# Patient Record
Sex: Female | Born: 1980 | Race: White | Hispanic: No | Marital: Single | Smoking: Current every day smoker
Health system: Southern US, Community
[De-identification: ages and names within clinical notes are randomized; demographics above are authoritative.]

## PROBLEM LIST (undated history)

## (undated) DIAGNOSIS — F32A Depression, unspecified: Secondary | ICD-10-CM

## (undated) DIAGNOSIS — F329 Major depressive disorder, single episode, unspecified: Secondary | ICD-10-CM

## (undated) DIAGNOSIS — J40 Bronchitis, not specified as acute or chronic: Secondary | ICD-10-CM

## (undated) DIAGNOSIS — N760 Acute vaginitis: Secondary | ICD-10-CM

## (undated) DIAGNOSIS — J45909 Unspecified asthma, uncomplicated: Secondary | ICD-10-CM

## (undated) DIAGNOSIS — N898 Other specified noninflammatory disorders of vagina: Secondary | ICD-10-CM

## (undated) DIAGNOSIS — R87629 Unspecified abnormal cytological findings in specimens from vagina: Secondary | ICD-10-CM

## (undated) DIAGNOSIS — M539 Dorsopathy, unspecified: Secondary | ICD-10-CM

## (undated) DIAGNOSIS — B9689 Other specified bacterial agents as the cause of diseases classified elsewhere: Secondary | ICD-10-CM

## (undated) DIAGNOSIS — Z8619 Personal history of other infectious and parasitic diseases: Secondary | ICD-10-CM

## (undated) DIAGNOSIS — N644 Mastodynia: Secondary | ICD-10-CM

## (undated) DIAGNOSIS — A749 Chlamydial infection, unspecified: Secondary | ICD-10-CM

## (undated) DIAGNOSIS — F419 Anxiety disorder, unspecified: Secondary | ICD-10-CM

## (undated) HISTORY — DX: Other specified bacterial agents as the cause of diseases classified elsewhere: B96.89

## (undated) HISTORY — DX: Unspecified abnormal cytological findings in specimens from vagina: R87.629

## (undated) HISTORY — PX: TOTAL ABDOMINAL HYSTERECTOMY: SHX209

## (undated) HISTORY — PX: ABDOMINAL HYSTERECTOMY: SHX81

## (undated) HISTORY — DX: Acute vaginitis: N76.0

## (undated) HISTORY — DX: Unspecified asthma, uncomplicated: J45.909

## (undated) HISTORY — DX: Mastodynia: N64.4

## (undated) HISTORY — PX: TONSILLECTOMY: SUR1361

## (undated) HISTORY — DX: Major depressive disorder, single episode, unspecified: F32.9

## (undated) HISTORY — DX: Anxiety disorder, unspecified: F41.9

## (undated) HISTORY — DX: Other specified noninflammatory disorders of vagina: N89.8

## (undated) HISTORY — DX: Personal history of other infectious and parasitic diseases: Z86.19

## (undated) HISTORY — DX: Dorsopathy, unspecified: M53.9

## (undated) HISTORY — PX: TUBAL LIGATION: SHX77

## (undated) HISTORY — DX: Chlamydial infection, unspecified: A74.9

## (undated) HISTORY — DX: Depression, unspecified: F32.A

---

## 2003-04-25 ENCOUNTER — Emergency Department (HOSPITAL_COMMUNITY): Admission: EM | Admit: 2003-04-25 | Discharge: 2003-04-25 | Payer: Self-pay

## 2003-04-26 ENCOUNTER — Emergency Department (HOSPITAL_COMMUNITY): Admission: EM | Admit: 2003-04-26 | Discharge: 2003-04-26 | Payer: Self-pay | Admitting: Emergency Medicine

## 2003-07-04 ENCOUNTER — Emergency Department (HOSPITAL_COMMUNITY): Admission: EM | Admit: 2003-07-04 | Discharge: 2003-07-05 | Payer: Self-pay | Admitting: Emergency Medicine

## 2006-05-19 ENCOUNTER — Other Ambulatory Visit: Admission: RE | Admit: 2006-05-19 | Discharge: 2006-05-19 | Payer: Self-pay | Admitting: Family Medicine

## 2006-08-10 ENCOUNTER — Emergency Department (HOSPITAL_COMMUNITY): Admission: EM | Admit: 2006-08-10 | Discharge: 2006-08-10 | Payer: Self-pay | Admitting: Emergency Medicine

## 2007-08-12 ENCOUNTER — Ambulatory Visit: Payer: Self-pay | Admitting: Internal Medicine

## 2007-08-19 ENCOUNTER — Ambulatory Visit (HOSPITAL_COMMUNITY): Admission: RE | Admit: 2007-08-19 | Discharge: 2007-08-19 | Payer: Self-pay | Admitting: Internal Medicine

## 2007-12-09 ENCOUNTER — Ambulatory Visit: Payer: Self-pay | Admitting: Internal Medicine

## 2008-07-26 ENCOUNTER — Ambulatory Visit: Payer: Self-pay | Admitting: Internal Medicine

## 2008-07-26 ENCOUNTER — Encounter: Payer: Self-pay | Admitting: Urgent Care

## 2009-07-27 ENCOUNTER — Other Ambulatory Visit: Admission: RE | Admit: 2009-07-27 | Discharge: 2009-07-27 | Payer: Self-pay | Admitting: Obstetrics & Gynecology

## 2009-08-08 ENCOUNTER — Ambulatory Visit (HOSPITAL_COMMUNITY): Admission: RE | Admit: 2009-08-08 | Discharge: 2009-08-08 | Payer: Self-pay | Admitting: Obstetrics & Gynecology

## 2009-09-12 ENCOUNTER — Encounter: Payer: Self-pay | Admitting: Obstetrics & Gynecology

## 2009-09-12 ENCOUNTER — Inpatient Hospital Stay (HOSPITAL_COMMUNITY): Admission: RE | Admit: 2009-09-12 | Discharge: 2009-09-14 | Payer: Self-pay | Admitting: Obstetrics & Gynecology

## 2009-09-16 ENCOUNTER — Emergency Department (HOSPITAL_COMMUNITY): Admission: EM | Admit: 2009-09-16 | Discharge: 2009-09-16 | Payer: Self-pay | Admitting: Emergency Medicine

## 2010-09-04 ENCOUNTER — Emergency Department (HOSPITAL_COMMUNITY)
Admission: EM | Admit: 2010-09-04 | Discharge: 2010-09-04 | Payer: Self-pay | Source: Home / Self Care | Admitting: Emergency Medicine

## 2010-09-30 ENCOUNTER — Encounter: Payer: Self-pay | Admitting: Obstetrics & Gynecology

## 2010-11-24 LAB — DIFFERENTIAL
Basophils Absolute: 0 10*3/uL (ref 0.0–0.1)
Basophils Relative: 0 % (ref 0–1)
Lymphocytes Relative: 16 % (ref 12–46)
Monocytes Relative: 8 % (ref 3–12)

## 2010-11-24 LAB — URINALYSIS, ROUTINE W REFLEX MICROSCOPIC
Bilirubin Urine: NEGATIVE
Hgb urine dipstick: NEGATIVE
Nitrite: NEGATIVE
Nitrite: NEGATIVE
Protein, ur: NEGATIVE mg/dL
Specific Gravity, Urine: 1.015 (ref 1.005–1.030)
Urobilinogen, UA: 0.2 mg/dL (ref 0.0–1.0)
Urobilinogen, UA: 0.2 mg/dL (ref 0.0–1.0)

## 2010-11-24 LAB — COMPREHENSIVE METABOLIC PANEL
Albumin: 4.2 g/dL (ref 3.5–5.2)
BUN: 7 mg/dL (ref 6–23)
Creatinine, Ser: 0.76 mg/dL (ref 0.4–1.2)
Sodium: 138 mEq/L (ref 135–145)
Total Bilirubin: 0.6 mg/dL (ref 0.3–1.2)
Total Protein: 7.1 g/dL (ref 6.0–8.3)

## 2010-11-24 LAB — CBC
Hemoglobin: 11.2 g/dL — ABNORMAL LOW (ref 12.0–15.0)
Hemoglobin: 11.7 g/dL — ABNORMAL LOW (ref 12.0–15.0)
Hemoglobin: 13.9 g/dL (ref 12.0–15.0)
MCHC: 33.6 g/dL (ref 30.0–36.0)
MCHC: 33.7 g/dL (ref 30.0–36.0)
MCHC: 33.8 g/dL (ref 30.0–36.0)
Platelets: 272 10*3/uL (ref 150–400)
RBC: 3.5 MIL/uL — ABNORMAL LOW (ref 3.87–5.11)
RBC: 3.65 MIL/uL — ABNORMAL LOW (ref 3.87–5.11)
RDW: 13.6 % (ref 11.5–15.5)
RDW: 13.6 % (ref 11.5–15.5)
WBC: 12 10*3/uL — ABNORMAL HIGH (ref 4.0–10.5)

## 2010-11-24 LAB — WOUND CULTURE: Gram Stain: NONE SEEN

## 2010-11-24 LAB — WET PREP, GENITAL: WBC, Wet Prep HPF POC: NONE SEEN

## 2010-11-24 LAB — TYPE AND SCREEN: ABO/RH(D): O POS

## 2010-11-24 LAB — URINE MICROSCOPIC-ADD ON

## 2010-12-11 LAB — COMPREHENSIVE METABOLIC PANEL
Albumin: 4.1 g/dL (ref 3.5–5.2)
Alkaline Phosphatase: 91 U/L (ref 39–117)
BUN: 9 mg/dL (ref 6–23)
Creatinine, Ser: 0.68 mg/dL (ref 0.4–1.2)
GFR calc Af Amer: >60 mL/min (ref >60)
Glucose, Bld: 86 mg/dL (ref 70–99)
Potassium: 3.8 mEq/L (ref 3.5–5.1)
Total Protein: 7.1 g/dL (ref 6.0–8.3)

## 2010-12-11 LAB — URINALYSIS, ROUTINE W REFLEX MICROSCOPIC
Ketones, ur: NEGATIVE mg/dL
Nitrite: NEGATIVE
Protein, ur: NEGATIVE mg/dL
pH: 6 (ref 5.0–8.0)

## 2010-12-11 LAB — HCG, QUANTITATIVE, PREGNANCY: hCG, Beta Chain, Quant, S: <2 m[IU]/mL (ref <5)

## 2010-12-11 LAB — CBC
MCV: 94.5 fL (ref 78.0–100.0)
RBC: 4.39 MIL/uL (ref 3.87–5.11)
WBC: 5.7 10*3/uL (ref 4.0–10.5)

## 2011-01-21 NOTE — Assessment & Plan Note (Signed)
NAMEMarland Kitchen  [place], [place] M                 CHART#:  [id number]   DATE:  12/09/2007                       DOB:  1981-07-03   CHIEF COMPLAINT:  Follow-up abdominal pain.   Alice Santiago is a 30 year old female who complains of mostly right upper  quadrant abdominal pain.  This has been present for about 6 months now.  She also has had intermittent constipation and nausea.  Workup thus far  has included a HIDA scan which revealed a low normal gallbladder  ejection fracture where she developed nausea and abdominal cramps and  CCK but not her typical right upper quadrant.  Symptoms were felt not  typical for biliary disease.  She has constipation alternating with  diarrhea.  She had CBC, met 7 and TSH all of which were normal.  She  also had a full set of stool studies which included culture ova and  parasite and C. diff which were negative.  She had a CT scan of the  abdomen and pelvis with HIV and oral contrast on 08/19/07 which showed a  right ovarian cyst which is 1.7 cm.  Small to moderate amount of free  pelvic fluid.  She then had a pelvic ultrasound on 09/10/07 which showed  a 2.4 cm simple cyst in the left ovary and a 12 mm hyperechoic lesion in  the right ovary possibly a involuting hemorrhagic cyst or follicle.  She  is recommended to have follow-up ultrasound.  She continues to have some  right upper quadrant pain. She had not tried any Bentyl for this which  she was given at last office visit.  She tells me she did not have the  money to go get it.  She complains of intermittent nausea.  She notes  the pain is typically worse with movement or breathing.  She denies any  vomiting.  She rates the pain at 6/10 at worse on pain scale. It usually  lasts a couple of hours.  She has been passing gas more frequently.  She  denies any water brush or anorexia. She does has have loose stools every  time she eats anywhere from 1-4 x per day but does not describe them as  watery.  She denies any  rectal bleeding or melena.  She tells me she is  applying for disability due to her history of manic depression/bipolar  disorder.   ALLERGIES:  Denies any allergies.  No known drug allergies.   OBJECTIVE:  VITALS:  Weight 156.5 pounds, height 63 inches, temp 98.1.  Blood pressure 98/70 and pulse 72.  GENERAL:  Alice Santiago is a 30 year old well-developed, well-nourished  Caucasian female, no acute distress.  HEENT:  Sclera clear, nonicteric.  Conjunctiva pink.  Oropharynx pink  and moist without any lesion.  NECK:  Supple without hepatosplenomegaly.  CHEST/HEART:  Regular rate and rhythm. Normal S1/S2.  ABDOMEN:  Positive bowel sounds x4.  No bruits auscultated. Soft,  nontender, nondistended without palpable mass or hepatosplenomegaly.  No  rebound tenderness or guarding.  EXTREMITIES:  Without clubbing or edema bilaterally.   ASSESSMENT:  Alice Santiago is a 30 year old female with right upper  quadrant pain as well as intermittent nausea more along the lines of  dyspepsia.  I suspect she may have gastroesophageal reflux disease,  gastritis, or nonulcer dyspepsia.  She also has postprandial  loose  stools along the lines of IBS.  Weight is up 4-1/2 pounds since 6 months  ago.  Thus far CT scan, lab work, and stool studies have been benign.  I  suspect she may have an element of IBS as well.   She does need follow-up of her ovarian cyst and free pelvic fluid and  will refer her back to Paulene Floor, her health care Namine Beahm at [facility].   PLAN:  1. Referral back to Ms. Daphine Deutscher at [facility]      for ovarian cyst to follow-up.  Will send a copy of CT scan and      ultrasound report.  2. Begin omeprazole 20 mg daily #30 with 2 refills.  3. We will give her some samples of Prilosec 20 mg today until she can      get the prescription filled.  4. Trial of Bentyl and she has prescription from last office visit.  I      did encourage her to  go ahead and get her prescriptions filled as      she has not yet done due to monetary reasons.  5. Office visit in one month with Dr. Jena Gauss to asses how she is doing      on omeprazole and Bentyl.       Lorenza Burton, N.P.  Electronically Signed     R. Roetta Sessions, M.D.  Electronically Signed    KJ/MEDQ  D:  12/10/2007  T:  12/10/2007  Job:  [id number]   cc:   Paulene Floor

## 2011-01-21 NOTE — Assessment & Plan Note (Signed)
NAMEMarland Kitchen  [place], [place] M                 CHART#:  [id number]   DATE:  12/09/2007                       DOB:  Oct 11, 1980   CHIEF COMPLAINT:  Followup abdominal pain.   SUBJECTIVE:  The patient is a 30 year old female with a history of       Lorenza Burton, N.P.  Electronically Signed     R. Roetta Sessions, M.D.  Electronically Signed    KJ/MEDQ  D:  12/10/2007  T:  12/10/2007  Job:  [id number]

## 2011-01-21 NOTE — Assessment & Plan Note (Signed)
NAMEMarland Kitchen  [place], [place] M                 CHART#:  [id number]   DATE:  07/26/2008                       DOB:  1981/01/30   CHIEF COMPLAINT:  Abdominal pain and weight gain.   PROBLEM LIST:  1. Chronic abdominal pain with loose stools felt to be irritable bowel      syndrome.  2. Anxiety.  3. Depression.  4. Asthma.  5. Bipolar disorder.  6. C-section x2.  7. Left wrist and left foot surgery.  8. Tonsillectomy.  9. Gastroesophageal reflux disease.   SUBJECTIVE:  The patient is a 30 year old Caucasian female.  She  presents for followup of chronic abdominal pain.  She tells me she is  concerned of the fact she is gaining weight.  She has gained 28 pounds  since she was seen 11 months ago.  She occasionally has heartburn and  indigestion.  She is having right upper quadrant pain, which is worse  postprandially.  She had a normal HIDA scan in November of last year.  She has daily loose stools about twice per day.  Her last menstrual  period was 3 months ago.  She has been treated with megestrol for a  complex right ovarian cyst and is being followed and seen by Dr. Despina Hidden  for this.  She has recently had a TSH, which is normal now.  She denies  any nausea or vomiting.  Denies any anorexia.   ALLERGIES:  No known drug allergies.   OBJECTIVE:  VITAL SIGNS:  Weight 108 pounds, height 63 inches,  temperature 98.4, blood pressure 122/80, and pulse 72.  GENERAL:  The patient is an obese Caucasian female who is alert,  oriented, pleasant, and cooperative, in no acute distress.  HEENT:  Sclerae clear and nonicteric.  Conjunctivae pink.  Oropharynx  pink and moist without any lesions.  CHEST:  Heart regular rate and rhythm.  Normal S1 and S2 without any  murmurs, clicks, rubs, or gallops.  ABDOMEN:  Protuberant with positive bowel sounds x4.  No bruits  auscultated.  Soft, mildly distended.  She does have mild tenderness to  right upper quadrant and epigastrium on deep palpation.  There is no  rebound, tenderness, or guarding.  No hepatosplenomegaly or mass.  EXTREMITIES:  Without clubbing or edema.   IMPRESSION:  The patient is a 30 year old Caucasian female with right-  sided abdominal pain and diarrhea, which I suspect is due to an amount  of irritable bowel syndrome.  She also continues to gain weight and is  now significantly overweight.  We did discuss this in detail.  She does  have heartburn and indigestion and her right upper quadrant abdominal  pain may very well be related to gastroesophageal reflux  disease/gastritis and I would like to see if she has any improvement on  proton pump inhibitor.   PLAN:  1. Omeprazole 20 mg daily #31 with 1 refill.  2. Bentyl 10 mg a.c. and nightly, #90 with 0 refills to use p.r.n. for      diarrhea.  3. Urine pregnancy before she begins either medication.  4. We will request recent lab work from Dr. Forestine Chute office in [facility].  5. Office visit in 6 weeks to assess whether she has had improvement      with antispasmodic  and PPI or whether she is going to need further      workup.        Lorenza Burton, N.P.  Electronically Signed     R. Roetta Sessions, M.D.  Electronically Signed    KJ/MEDQ  D:  07/27/2008  T:  07/27/2008  Job:  [id number]   cc:   Ernestina Penna, M.D.

## 2011-01-21 NOTE — Telephone Encounter (Signed)
NAMEMarland Santiago  [place], [place] M                 CHART#:  [id number]   DATE:  08/12/2007                       DOB:  20-Aug-1981   GI CONSULTATION   REASON FOR CONSULTATION:  Abdominal pain, diarrhea.   REFERRING PHYSICIAN:  Ernestina Penna, M.D.   HISTORY OF PRESENT ILLNESS:  The patient is a 30 year old Caucasian  female who presents today for further evaluation of a 39-month history of  intermittent abdominal pain associated with diarrhea.  She has had pain  primarily in the right upper quadrant region, sometimes it is post  prandial, other times it just occurs.  She describes it as a sharp,  shooting type pain, sometimes it is crampy.  She has also noted post  prandial bowel movements.  The stool often is loose but she does pass  hard stools at times.  She has not seen any blood in her stool or  melena.  She has anywhere from 4-5 stools a day.  She has had nausea but  no vomiting.  She denies any heartburn type symptoms.  Over the past  several months she states she has gained about 20 pounds.  She states  she weighs the most she has ever weighed, even during her pregnancies.  She denies any dysphagia, odynophagia, dysuria, hematuria.  She notes  she has irregular menses with prolonged bleeding and spotting and severe  abdominal cramps.  She has had 2 cesarean sections, the last one about 2  years ago.  She states she was supposed to have her tubes tied at that  point but she is really not sure if this occurred and says that she was  told that she still could get pregnant.  She denies any nocturnal  diarrhea.  She says she has a lot of stress.  She is a single mom  raising a 66-year-old.  Her 57-year-old does not live in the home, and  lives with her parents.  She is currently not on any medications.   ALLERGIES:  No known drug allergies.   PAST MEDICAL HISTORY:  1. Anxiety.  2. Depression.  3. Asthma.  4. Bipolar disorder.   PAST SURGICAL HISTORY:  1. She has had 2 cesarean sections.  2. She had surgery on her left wrist twice.  3. Surgery on her left foot.  4. Tonsillectomy.   FAMILY HISTORY:  Mother is 53.  Father succumbed to an MI.  No family  history of colorectal cancer to her knowledge.  She is not aware of any  IBD.   SOCIAL HISTORY:  She is single.  She has 2 children.  Her 84-year-old  lives with her.  Her 82-year-old lives with her Mom and step-father.  She  is unemployed.  She smokes a half pack of cigarettes daily.  She  occasionally consumes alcohol.   REVIEW OF SYSTEMS:  Please see HPI for GI.  Constitutional:  See HPI.  Cardiopulmonary:  No chest pain or shortness of breath.  Genitourinary:  See HPI.   PHYSICAL EXAMINATION:  VITAL SIGNS:  Weight 152, height 5 feet 3 inches,  temp 98.2, blood pressure 110/70, pulse 68.  GENERAL:  A pleasant, well nourished, well developed Caucasian female in  no acute distress.  SKIN:  Warm and dry.  No jaundice.  HEENT:  Sclerae are nonicteric.  Oropharyngeal mucosa moist  and pink.  No lesions, erythema, or exudate.  No lymphadenopathy, thyromegaly.  CHEST:  Lungs are clear to auscultation.  CARDIAC:  Reveals a regular rate and rhythm.  Normal S1, S2.  No  murmurs, rubs, or gallops.  ABDOMEN:  Positive bowel sounds.  Abdomen is soft, nondistended.  She  has mild right upper quadrant tenderness on deep palpation.  No  organomegaly or masses.  No rebound tenderness or guarding.  No  abdominal bruits or hernias.  EXTREMITIES:  No edema.   IMPRESSION:  The patient is a 30 year old lady who complains of a 2-  month history of primarily right upper quadrant abdominal pain and post  prandial loose stools.  She also has intermittent constipation.  She has  had a 20-pound weight gain in the last few months.  Her gallbladder  workup was unremarkable outside of a low normal gallbladder ejection  fraction percent.  The patient states she developed some nausea and  abdominal cramps with CCK, but really not her typical right  upper  quadrant pain.  The symptoms are not classical for biliary disease.  The  patient's report of loose stools may possibly be due to irritable bowel  syndrome, however, other possibilities such as inflammatory bowel  disease, infectious etiology need to be ruled out.   PLAN:  1. CBC, C-MET, serum HCG, TSH.  2. Stool for culture, ova and parasite, C. Diff.  3. Trial of Bentyl 10 mg p.o. q.a.c. p.r.n. #90, one refill.  4. CT of the abdomen and pelvis with IV and oral contrast for further      evaluation of her abdominal pain.   I would like to thank Dr. Vernon Prey for allowing [place] to take part in the  care of this patient.      Tana Coast, P.A.  Electronically Signed     R. Roetta Sessions, M.D.  Electronically Signed    LL/MEDQ  D:  08/12/2007  T:  08/12/2007  Job:  [id number]   cc:   Ernestina Penna, M.D.

## 2013-06-03 ENCOUNTER — Ambulatory Visit: Payer: Self-pay | Admitting: Obstetrics and Gynecology

## 2013-07-25 ENCOUNTER — Other Ambulatory Visit: Payer: Self-pay | Admitting: Obstetrics & Gynecology

## 2013-07-26 ENCOUNTER — Encounter: Payer: Self-pay | Admitting: Obstetrics & Gynecology

## 2013-07-26 ENCOUNTER — Ambulatory Visit (INDEPENDENT_AMBULATORY_CARE_PROVIDER_SITE_OTHER): Payer: Medicaid Other | Admitting: Obstetrics & Gynecology

## 2013-07-26 ENCOUNTER — Encounter (INDEPENDENT_AMBULATORY_CARE_PROVIDER_SITE_OTHER): Payer: Self-pay

## 2013-07-26 ENCOUNTER — Other Ambulatory Visit (HOSPITAL_COMMUNITY)
Admission: RE | Admit: 2013-07-26 | Discharge: 2013-07-26 | Disposition: A | Discharge status: Home / Self Care | Payer: Medicaid Other | Source: Ambulatory Visit | Attending: Obstetrics & Gynecology | Admitting: Obstetrics & Gynecology

## 2013-07-26 VITALS — BP 110/70 | Ht 63.0 in | Wt 196.0 lb

## 2013-07-26 DIAGNOSIS — F39 Unspecified mood [affective] disorder: Secondary | ICD-10-CM | POA: Insufficient documentation

## 2013-07-26 DIAGNOSIS — Z7251 High risk heterosexual behavior: Secondary | ICD-10-CM

## 2013-07-26 DIAGNOSIS — Z01419 Encounter for gynecological examination (general) (routine) without abnormal findings: Secondary | ICD-10-CM

## 2013-07-26 DIAGNOSIS — Z0000 Encounter for general adult medical examination without abnormal findings: Secondary | ICD-10-CM

## 2013-07-26 DIAGNOSIS — Z113 Encounter for screening for infections with a predominantly sexual mode of transmission: Secondary | ICD-10-CM | POA: Insufficient documentation

## 2013-07-26 DIAGNOSIS — Z1151 Encounter for screening for human papillomavirus (HPV): Secondary | ICD-10-CM | POA: Insufficient documentation

## 2013-07-26 LAB — RPR

## 2013-07-26 NOTE — Progress Notes (Signed)
Patient ID: Alice Santiago, female   DOB: [birth year], 32 y.o.   MRN: [id number] Subjective:     Alice Santiago is a 32 y.o. female here for a routine exam.  No LMP recorded. Patient has had a hysterectomy. No obstetric history on file. Current complaints: none, wants STD testing. .   Gynecologic History No LMP recorded. Patient has had a hysterectomy. Contraception: status post hysterectomy Last Pap: 2012. Results were: normal Last mammogram: na. Results were: na  Past Medical History  Diagnosis Date  . Anxiety   . Depression   . Back problem     Past Surgical History  Procedure Laterality Date  . Abdominal hysterectomy    . Tubal ligation      OB History   Grav Para Term Preterm Abortions TAB SAB Ect Mult Living                  History   Social History  . Marital Status: Single    Spouse Name: N/A    Number of Children: N/A  . Years of Education: N/A   Social History Main Topics  . Smoking status: Current Every Day Smoker  . Smokeless tobacco: None  . Alcohol Use: None  . Drug Use: None  . Sexual Activity: None   Other Topics Concern  . None   Social History Narrative  . None    History reviewed. No pertinent family history.   Review of Systems  Review of Systems  Constitutional: Negative for fever, chills, weight loss, malaise/fatigue and diaphoresis.  HENT: Negative for hearing loss, ear pain, nosebleeds, congestion, sore throat, neck pain, tinnitus and ear discharge.   Eyes: Negative for blurred vision, double vision, photophobia, pain, discharge and redness.  Respiratory: Negative for cough, hemoptysis, sputum production, shortness of breath, wheezing and stridor.   Cardiovascular: Negative for chest pain, palpitations, orthopnea, claudication, leg swelling and PND.  Gastrointestinal: negative for abdominal pain. Negative for heartburn, nausea, vomiting, diarrhea, constipation, blood in stool and melena.  Genitourinary: Negative for dysuria,  urgency, frequency, hematuria and flank pain.  Musculoskeletal: Negative for myalgias, back pain, joint pain and falls.  Skin: Negative for itching and rash.  Neurological: Negative for dizziness, tingling, tremors, sensory change, speech change, focal weakness, seizures, loss of consciousness, weakness and headaches.  Endo/Heme/Allergies: Negative for environmental allergies and polydipsia. Does not bruise/bleed easily.  Psychiatric/Behavioral: Negative for depression, suicidal ideas, hallucinations, memory loss and substance abuse. The patient is not nervous/anxious and does not have insomnia.        Objective:    Physical Exam  Vitals reviewed. Constitutional: She is oriented to person, place, and time. She appears well-developed and well-nourished.  HENT:  Head: Normocephalic and atraumatic.        Right Ear: External ear normal.  Left Ear: External ear normal.  Nose: Nose normal.  Mouth/Throat: Oropharynx is clear and moist.  Eyes: Conjunctivae and EOM are normal. Pupils are equal, round, and reactive to light. Right eye exhibits no discharge. Left eye exhibits no discharge. No scleral icterus.  Neck: Normal range of motion. Neck supple. No tracheal deviation present. No thyromegaly present.  Cardiovascular: Normal rate, regular rhythm, normal heart sounds and intact distal pulses.  Exam reveals no gallop and no friction rub.   No murmur heard. Respiratory: Effort normal and breath sounds normal. No respiratory distress. She has no wheezes. She has no rales. She exhibits no tenderness.  GI: Soft. Bowel sounds are normal. She exhibits no distension and  no mass. There is no tenderness. There is no rebound and no guarding.  Genitourinary:  Breasts no masses skin changes or nipple changes bilaterally      Vulva is normal without lesions Vagina is pink moist without discharge Cervix absent Uterus is absent Adnexa is negative with normal sized ovaries   Musculoskeletal: Normal range  of motion. She exhibits no edema and no tenderness.  Neurological: She is alert and oriented to person, place, and time. She has normal reflexes. She displays normal reflexes. No cranial nerve deficit. She exhibits normal muscle tone. Coordination normal.  Skin: Skin is warm and dry. No rash noted. No erythema. No pallor.  Psychiatric: She has a normal mood and affect. Her behavior is normal. Judgment and thought content normal.       Assessment:    Healthy female exam.    Plan:    Follow up in: 1 year.   STD evaluation

## 2013-07-26 NOTE — Addendum Note (Signed)
Addended by: Richardson Chiquito on: 07/26/2013 11:42 AM   Modules accepted: Orders

## 2013-07-27 LAB — HSV 2 ANTIBODY, IGG: HSV 2 Glycoprotein G Ab, IgG: 12.04 IV — ABNORMAL HIGH

## 2013-07-27 LAB — HEPATITIS C ANTIBODY: HCV Ab: NEGATIVE

## 2013-07-27 LAB — HIV ANTIBODY (ROUTINE TESTING W REFLEX): HIV: NONREACTIVE

## 2013-11-22 DIAGNOSIS — M51369 Other intervertebral disc degeneration, lumbar region without mention of lumbar back pain or lower extremity pain: Secondary | ICD-10-CM | POA: Insufficient documentation

## 2014-07-28 ENCOUNTER — Ambulatory Visit (INDEPENDENT_AMBULATORY_CARE_PROVIDER_SITE_OTHER): Payer: Medicaid Other | Admitting: Obstetrics & Gynecology

## 2014-07-28 ENCOUNTER — Encounter: Payer: Self-pay | Admitting: Obstetrics & Gynecology

## 2014-07-28 VITALS — BP 128/80 | Ht 63.0 in | Wt 184.0 lb

## 2014-07-28 DIAGNOSIS — R739 Hyperglycemia, unspecified: Secondary | ICD-10-CM

## 2014-07-28 DIAGNOSIS — Z01419 Encounter for gynecological examination (general) (routine) without abnormal findings: Secondary | ICD-10-CM | POA: Diagnosis not present

## 2014-07-28 DIAGNOSIS — Z7251 High risk heterosexual behavior: Secondary | ICD-10-CM

## 2014-07-28 DIAGNOSIS — Z0000 Encounter for general adult medical examination without abnormal findings: Secondary | ICD-10-CM | POA: Diagnosis not present

## 2014-07-28 DIAGNOSIS — E038 Other specified hypothyroidism: Secondary | ICD-10-CM

## 2014-07-28 NOTE — Progress Notes (Signed)
Patient ID: Alice Santiago, female   DOB: [birth year], 33 y.o.   MRN: [id number] Subjective:     Alice Santiago is a 33 y.o. female here for a routine exam.  No LMP recorded. Patient has had a hysterectomy. No obstetric history on file. Birth Control Method:  hysterectomy Menstrual Calendar(currently): na  Current complaints: wants to be checked for STDs.   Current acute medical issues:  none   Recent Gynecologic History No LMP recorded. Patient has had a hysterectomy. Last Pap: 2013,  normal Last mammogram: ,    Past Medical History  Diagnosis Date  . Anxiety   . Depression   . Back problem     Past Surgical History  Procedure Laterality Date  . Abdominal hysterectomy    . Tubal ligation      OB History    No data available      History   Social History  . Marital Status: Single    Spouse Name: N/A    Number of Children: N/A  . Years of Education: N/A   Social History Main Topics  . Smoking status: Current Every Day Smoker  . Smokeless tobacco: None  . Alcohol Use: None  . Drug Use: None  . Sexual Activity: None   Other Topics Concern  . None   Social History Narrative    History reviewed. No pertinent family history.  Current outpatient prescriptions: clonazePAM (KLONOPIN) 0.5 MG tablet, Take 0.5 mg by mouth 2 (two) times daily as needed for anxiety., Disp: , Rfl: ;  oxyCODONE-acetaminophen (PERCOCET) 10-325 MG per tablet, Take 1 tablet by mouth every 4 (four) hours as needed for pain., Disp: , Rfl: ;  sertraline (ZOLOFT) 25 MG tablet, Take 25 mg by mouth daily., Disp: , Rfl:  venlafaxine XR (EFFEXOR-XR) 37.5 MG 24 hr capsule, 150 mg daily with breakfast. , Disp: , Rfl: ;  ALPRAZolam (XANAX) 1 MG tablet, Take 1 mg by mouth at bedtime as needed for anxiety., Disp: , Rfl: ;  ARIPiprazole (ABILIFY) 10 MG tablet, Take 10 mg by mouth daily., Disp: , Rfl: ;  gabapentin (NEURONTIN) 300 MG capsule, Take 300 mg by mouth 3 (three) times daily., Disp: , Rfl:   Review of  Systems  Review of Systems  Constitutional: Negative for fever, chills, weight loss, malaise/fatigue and diaphoresis.  HENT: Negative for hearing loss, ear pain, nosebleeds, congestion, sore throat, neck pain, tinnitus and ear discharge.   Eyes: Negative for blurred vision, double vision, photophobia, pain, discharge and redness.  Respiratory: Negative for cough, hemoptysis, sputum production, shortness of breath, wheezing and stridor.   Cardiovascular: Negative for chest pain, palpitations, orthopnea, claudication, leg swelling and PND.  Gastrointestinal: negative for abdominal pain. Negative for heartburn, nausea, vomiting, diarrhea, constipation, blood in stool and melena.  Genitourinary: Negative for dysuria, urgency, frequency, hematuria and flank pain.  Musculoskeletal: Negative for myalgias, back pain, joint pain and falls.  Skin: Negative for itching and rash.  Neurological: Negative for dizziness, tingling, tremors, sensory change, speech change, focal weakness, seizures, loss of consciousness, weakness and headaches.  Endo/Heme/Allergies: Negative for environmental allergies and polydipsia. Does not bruise/bleed easily.  Psychiatric/Behavioral: Negative for depression, suicidal ideas, hallucinations, memory loss and substance abuse. The patient is not nervous/anxious and does not have insomnia.        Objective:  Blood pressure 128/80, height 5\' 3"  (1.6 m), weight 184 lb (83.462 kg).   Physical Exam  Vitals reviewed. Constitutional: She is oriented to person, place, and time. She  appears well-developed and well-nourished.  HENT:  Head: Normocephalic and atraumatic.        Right Ear: External ear normal.  Left Ear: External ear normal.  Nose: Nose normal.  Mouth/Throat: Oropharynx is clear and moist.  Eyes: Conjunctivae and EOM are normal. Pupils are equal, round, and reactive to light. Right eye exhibits no discharge. Left eye exhibits no discharge. No scleral icterus.  Neck:  Normal range of motion. Neck supple. No tracheal deviation present. No thyromegaly present.  Cardiovascular: Normal rate, regular rhythm, normal heart sounds and intact distal pulses.  Exam reveals no gallop and no friction rub.   No murmur heard. Respiratory: Effort normal and breath sounds normal. No respiratory distress. She has no wheezes. She has no rales. She exhibits no tenderness.  GI: Soft. Bowel sounds are normal. She exhibits no distension and no mass. There is no tenderness. There is no rebound and no guarding.  Genitourinary:  Breasts no masses skin changes or nipple changes bilaterally      Vulva is normal without lesions Vagina is pink moist without discharge Cervix absent Uterus is absent Adnexa is negative with normal sized ovaries    Musculoskeletal: Normal range of motion. She exhibits no edema and no tenderness.  Neurological: She is alert and oriented to person, place, and time. She has normal reflexes. She displays normal reflexes. No cranial nerve deficit. She exhibits normal muscle tone. Coordination normal.  Skin: Skin is warm and dry. No rash noted. No erythema. No pallor.  Psychiatric: She has a normal mood and affect. Her behavior is normal. Judgment and thought content normal.       Assessment:    Healthy female exam.    Plan:    Follow up in: 1 year.   STD screen

## 2014-07-29 LAB — HIV ANTIBODY (ROUTINE TESTING W REFLEX): HIV: NONREACTIVE

## 2014-07-29 LAB — RPR

## 2014-07-29 LAB — HEPATITIS C ANTIBODY: HCV AB: NEGATIVE

## 2014-07-29 LAB — HEMOGLOBIN A1C
HEMOGLOBIN A1C: 5.9 % — AB (ref <5.7)
Mean Plasma Glucose: 123 mg/dL — ABNORMAL HIGH (ref <117)

## 2014-07-29 LAB — TSH: TSH: 1.048 u[IU]/mL (ref 0.350–4.500)

## 2014-07-31 ENCOUNTER — Telehealth: Payer: Self-pay | Admitting: Obstetrics & Gynecology

## 2014-07-31 NOTE — Telephone Encounter (Signed)
Pt informed of A1C 5.9, TSH 1.048, and negative results of HIV, RPR, Hep C from 07/28/2014.

## 2014-11-03 ENCOUNTER — Other Ambulatory Visit: Payer: Self-pay | Admitting: Neurological Surgery

## 2014-11-08 ENCOUNTER — Encounter (HOSPITAL_COMMUNITY): Payer: Self-pay

## 2014-11-08 ENCOUNTER — Encounter (HOSPITAL_COMMUNITY)
Admission: RE | Admit: 2014-11-08 | Discharge: 2014-11-08 | Disposition: A | Discharge status: Home / Self Care | Payer: Medicaid Other | Source: Ambulatory Visit | Attending: Neurological Surgery | Admitting: Neurological Surgery

## 2014-11-08 DIAGNOSIS — F329 Major depressive disorder, single episode, unspecified: Secondary | ICD-10-CM | POA: Diagnosis not present

## 2014-11-08 DIAGNOSIS — M5117 Intervertebral disc disorders with radiculopathy, lumbosacral region: Secondary | ICD-10-CM | POA: Diagnosis present

## 2014-11-08 DIAGNOSIS — IMO0002 Reserved for concepts with insufficient information to code with codable children: Secondary | ICD-10-CM

## 2014-11-08 DIAGNOSIS — Z79899 Other long term (current) drug therapy: Secondary | ICD-10-CM | POA: Diagnosis not present

## 2014-11-08 DIAGNOSIS — F17210 Nicotine dependence, cigarettes, uncomplicated: Secondary | ICD-10-CM | POA: Diagnosis not present

## 2014-11-08 DIAGNOSIS — F419 Anxiety disorder, unspecified: Secondary | ICD-10-CM | POA: Diagnosis not present

## 2014-11-08 HISTORY — DX: Bronchitis, not specified as acute or chronic: J40

## 2014-11-08 LAB — CBC WITH DIFFERENTIAL/PLATELET
Basophils Absolute: 0 10*3/uL (ref 0.0–0.1)
Basophils Relative: 0 % (ref 0–1)
EOS ABS: 0 10*3/uL (ref 0.0–0.7)
Eosinophils Relative: 0 % (ref 0–5)
HCT: 38.4 % (ref 36.0–46.0)
Hemoglobin: 12.6 g/dL (ref 12.0–15.0)
Lymphocytes Relative: 16 % (ref 12–46)
Lymphs Abs: 1.4 10*3/uL (ref 0.7–4.0)
MCH: 31.7 pg (ref 26.0–34.0)
MCHC: 32.8 g/dL (ref 30.0–36.0)
MCV: 96.7 fL (ref 78.0–100.0)
MONO ABS: 0.5 10*3/uL (ref 0.1–1.0)
MONOS PCT: 5 % (ref 3–12)
Neutro Abs: 6.8 10*3/uL (ref 1.7–7.7)
Neutrophils Relative %: 79 % — ABNORMAL HIGH (ref 43–77)
Platelets: 293 10*3/uL (ref 150–400)
RBC: 3.97 MIL/uL (ref 3.87–5.11)
RDW: 14 % (ref 11.5–15.5)
WBC: 8.7 10*3/uL (ref 4.0–10.5)

## 2014-11-08 LAB — SURGICAL PCR SCREEN
MRSA, PCR: NEGATIVE
Staphylococcus aureus: NEGATIVE

## 2014-11-08 LAB — PROTIME-INR
INR: 0.99 (ref 0.00–1.49)
PROTHROMBIN TIME: 13.2 s (ref 11.6–15.2)

## 2014-11-08 LAB — BASIC METABOLIC PANEL
Anion gap: 4 — ABNORMAL LOW (ref 5–15)
BUN: 8 mg/dL (ref 6–23)
CALCIUM: 8.8 mg/dL (ref 8.4–10.5)
CHLORIDE: 110 mmol/L (ref 96–112)
CO2: 24 mmol/L (ref 19–32)
Creatinine, Ser: 0.65 mg/dL (ref 0.50–1.10)
GFR calc Af Amer: >90 mL/min (ref >90)
Glucose, Bld: 113 mg/dL — ABNORMAL HIGH (ref 70–99)
Potassium: 3.6 mmol/L (ref 3.5–5.1)
Sodium: 138 mmol/L (ref 135–145)

## 2014-11-08 NOTE — Pre-Procedure Instructions (Signed)
[place] Levada SchillingM [place]  11/08/2014   Your procedure is scheduled on: 11-10-2014   Friday   [facility] 8:30  AM.   Call this number if you have problems the morning of surgery: [phone]   Remember:   Do not eat food or drink liquids after midnight.    Take these medicines the morning of surgery with A SIP OF WATER: Inhaler as needed,oxycodone,sertraline(Zoloft)topamax,   Do not wear jewelry, make-up or nail polish.  Do not wear lotions, powders, or perfumes. You may not wear deodorant.  Do not shave 48 hours prior to surgery.    Do not bring valuables to the hospital.  [facility] is not responsible  for any belongings or valuables.               Contacts, dentures or bridgework may not be worn into surgery.   Leave suitcase in the car. After surgery it may be brought to your room.  For patients admitted to the hospital, discharge time is determined by your treatment team.               Patients discharged the day of surgery will not be allowed to drive home.     Special Instructions: See attached sheet for instructions on CHG  Shower/bath   Please read over the following fact sheets that you were given: Pain Booklet and Surgical Site Infection Prevention

## 2014-11-09 MED ORDER — DEXAMETHASONE SODIUM PHOSPHATE 10 MG/ML IJ SOLN
10.0000 mg | INTRAMUSCULAR | Status: AC
  Administered 2014-11-10: 10 mg via INTRAVENOUS
  Filled 2014-11-09: qty 1

## 2014-11-09 MED ORDER — CEFAZOLIN SODIUM-DEXTROSE 2-3 GM-% IV SOLR
2.0000 g | INTRAVENOUS | Status: AC
  Administered 2014-11-10: 2 g via INTRAVENOUS
  Filled 2014-11-09: qty 50

## 2014-11-10 ENCOUNTER — Encounter (HOSPITAL_COMMUNITY): Payer: Self-pay | Admitting: *Deleted

## 2014-11-10 ENCOUNTER — Ambulatory Visit (HOSPITAL_COMMUNITY): Payer: Medicaid Other

## 2014-11-10 ENCOUNTER — Encounter (HOSPITAL_COMMUNITY)
Admission: RE | Disposition: A | Discharge status: Home / Self Care | Payer: Self-pay | Source: Ambulatory Visit | Attending: Neurological Surgery

## 2014-11-10 ENCOUNTER — Ambulatory Visit (HOSPITAL_COMMUNITY): Payer: Medicaid Other | Admitting: Anesthesiology

## 2014-11-10 ENCOUNTER — Observation Stay (HOSPITAL_COMMUNITY)
Admission: RE | Admit: 2014-11-10 | Discharge: 2014-11-10 | Disposition: A | Discharge status: Home / Self Care | Payer: Medicaid Other | Source: Ambulatory Visit | Attending: Neurological Surgery | Admitting: Neurological Surgery

## 2014-11-10 DIAGNOSIS — F419 Anxiety disorder, unspecified: Secondary | ICD-10-CM | POA: Diagnosis not present

## 2014-11-10 DIAGNOSIS — M5117 Intervertebral disc disorders with radiculopathy, lumbosacral region: Principal | ICD-10-CM | POA: Insufficient documentation

## 2014-11-10 DIAGNOSIS — Z79899 Other long term (current) drug therapy: Secondary | ICD-10-CM | POA: Insufficient documentation

## 2014-11-10 DIAGNOSIS — F17210 Nicotine dependence, cigarettes, uncomplicated: Secondary | ICD-10-CM | POA: Insufficient documentation

## 2014-11-10 DIAGNOSIS — F329 Major depressive disorder, single episode, unspecified: Secondary | ICD-10-CM | POA: Insufficient documentation

## 2014-11-10 DIAGNOSIS — Z9889 Other specified postprocedural states: Secondary | ICD-10-CM

## 2014-11-10 DIAGNOSIS — Z419 Encounter for procedure for purposes other than remedying health state, unspecified: Secondary | ICD-10-CM

## 2014-11-10 HISTORY — PX: LUMBAR LAMINECTOMY/DECOMPRESSION MICRODISCECTOMY: SHX5026

## 2014-11-10 SURGERY — LUMBAR LAMINECTOMY/DECOMPRESSION MICRODISCECTOMY 1 LEVEL
Anesthesia: General | Site: Spine Lumbar | Laterality: Left

## 2014-11-10 MED ORDER — OXYCODONE HCL 5 MG PO TABS
5.0000 mg | ORAL_TABLET | Freq: Once | ORAL | Status: AC | PRN
  Administered 2014-11-10: 5 mg via ORAL

## 2014-11-10 MED ORDER — METHOCARBAMOL 500 MG PO TABS: 500.0000 mg | ORAL_TABLET | Freq: Four times a day (QID) | ORAL | Status: DC | PRN

## 2014-11-10 MED ORDER — THROMBIN 5000 UNITS EX SOLR
CUTANEOUS | Status: DC | PRN
  Administered 2014-11-10 (×2): 5000 [IU] via TOPICAL

## 2014-11-10 MED ORDER — SERTRALINE HCL 50 MG PO TABS
50.0000 mg | ORAL_TABLET | Freq: Every day | ORAL | Status: DC
  Filled 2014-11-10: qty 1

## 2014-11-10 MED ORDER — LACTATED RINGERS IV SOLN
INTRAVENOUS | Status: DC
  Administered 2014-11-10: 10:00:00 via INTRAVENOUS

## 2014-11-10 MED ORDER — POTASSIUM CHLORIDE IN NACL 20-0.9 MEQ/L-% IV SOLN
INTRAVENOUS | Status: DC
  Filled 2014-11-10 (×2): qty 1000

## 2014-11-10 MED ORDER — OXYCODONE HCL 5 MG PO TABS
5.0000 mg | ORAL_TABLET | Freq: Four times a day (QID) | ORAL | Status: DC | PRN
  Administered 2014-11-10: 5 mg via ORAL
  Filled 2014-11-10: qty 1

## 2014-11-10 MED ORDER — GLYCOPYRROLATE 0.2 MG/ML IJ SOLN
INTRAMUSCULAR | Status: DC | PRN
  Administered 2014-11-10: 0.6 mg via INTRAVENOUS

## 2014-11-10 MED ORDER — PROPOFOL 10 MG/ML IV BOLUS
INTRAVENOUS | Status: DC | PRN
  Administered 2014-11-10: 200 mg via INTRAVENOUS

## 2014-11-10 MED ORDER — ALBUTEROL SULFATE HFA 108 (90 BASE) MCG/ACT IN AERS
INHALATION_SPRAY | RESPIRATORY_TRACT | Status: DC | PRN
  Administered 2014-11-10: 4 via RESPIRATORY_TRACT
  Administered 2014-11-10: 2 via RESPIRATORY_TRACT

## 2014-11-10 MED ORDER — OXYCODONE HCL 5 MG PO TABS
ORAL_TABLET | ORAL | Status: AC
  Filled 2014-11-10: qty 1

## 2014-11-10 MED ORDER — DEXAMETHASONE SODIUM PHOSPHATE 4 MG/ML IJ SOLN: 4.0000 mg | Freq: Four times a day (QID) | INTRAMUSCULAR | Status: DC

## 2014-11-10 MED ORDER — ACETAMINOPHEN 325 MG PO TABS: 650.0000 mg | ORAL_TABLET | ORAL | Status: DC | PRN

## 2014-11-10 MED ORDER — SODIUM CHLORIDE 0.9 % IR SOLN
Status: DC | PRN
  Administered 2014-11-10: 500 mL

## 2014-11-10 MED ORDER — ONDANSETRON HCL 4 MG/2ML IJ SOLN
INTRAMUSCULAR | Status: DC | PRN
  Administered 2014-11-10 (×2): 4 mg via INTRAVENOUS

## 2014-11-10 MED ORDER — HEMOSTATIC AGENTS (NO CHARGE) OPTIME
TOPICAL | Status: DC | PRN
  Administered 2014-11-10: 1 via TOPICAL

## 2014-11-10 MED ORDER — PHENYLEPHRINE 40 MCG/ML (10ML) SYRINGE FOR IV PUSH (FOR BLOOD PRESSURE SUPPORT)
PREFILLED_SYRINGE | INTRAVENOUS | Status: AC
  Filled 2014-11-10: qty 10

## 2014-11-10 MED ORDER — DEXAMETHASONE 4 MG PO TABS: 4.0000 mg | ORAL_TABLET | Freq: Four times a day (QID) | ORAL | Status: DC

## 2014-11-10 MED ORDER — LIDOCAINE HCL (CARDIAC) 20 MG/ML IV SOLN
INTRAVENOUS | Status: DC | PRN
  Administered 2014-11-10: 100 mg via INTRAVENOUS

## 2014-11-10 MED ORDER — OXYCODONE-ACETAMINOPHEN 5-325 MG PO TABS
1.0000 | ORAL_TABLET | Freq: Four times a day (QID) | ORAL | Status: DC | PRN
  Administered 2014-11-10: 1 via ORAL
  Filled 2014-11-10: qty 1

## 2014-11-10 MED ORDER — 0.9 % SODIUM CHLORIDE (POUR BTL) OPTIME
TOPICAL | Status: DC | PRN
  Administered 2014-11-10: 1000 mL

## 2014-11-10 MED ORDER — MIDAZOLAM HCL 5 MG/5ML IJ SOLN
INTRAMUSCULAR | Status: DC | PRN
  Administered 2014-11-10: 2 mg via INTRAVENOUS

## 2014-11-10 MED ORDER — FENTANYL CITRATE 0.05 MG/ML IJ SOLN
INTRAMUSCULAR | Status: AC
  Filled 2014-11-10: qty 5

## 2014-11-10 MED ORDER — HYDROMORPHONE HCL 1 MG/ML IJ SOLN
INTRAMUSCULAR | Status: DC
Start: 2014-11-10 — End: 2014-11-10
  Filled 2014-11-10: qty 1

## 2014-11-10 MED ORDER — ACETAMINOPHEN 650 MG RE SUPP: 650.0000 mg | RECTAL | Status: DC | PRN

## 2014-11-10 MED ORDER — NEOSTIGMINE METHYLSULFATE 10 MG/10ML IV SOLN
INTRAVENOUS | Status: AC
  Filled 2014-11-10: qty 1

## 2014-11-10 MED ORDER — THROMBIN 5000 UNITS EX SOLR
CUTANEOUS | Status: DC | PRN
  Administered 2014-11-10: 5 mL via TOPICAL

## 2014-11-10 MED ORDER — PHENOL 1.4 % MT LIQD: 1.0000 | OROMUCOSAL | Status: DC | PRN

## 2014-11-10 MED ORDER — HYDROMORPHONE HCL 1 MG/ML IJ SOLN
INTRAMUSCULAR | Status: AC
  Filled 2014-11-10: qty 1

## 2014-11-10 MED ORDER — ALBUTEROL SULFATE (2.5 MG/3ML) 0.083% IN NEBU: 3.0000 mL | INHALATION_SOLUTION | Freq: Four times a day (QID) | RESPIRATORY_TRACT | Status: DC | PRN

## 2014-11-10 MED ORDER — TOPIRAMATE 25 MG PO TABS
50.0000 mg | ORAL_TABLET | Freq: Every day | ORAL | Status: DC
  Filled 2014-11-10: qty 2

## 2014-11-10 MED ORDER — ARTIFICIAL TEARS OP OINT
TOPICAL_OINTMENT | OPHTHALMIC | Status: DC | PRN
  Administered 2014-11-10: 1 via OPHTHALMIC

## 2014-11-10 MED ORDER — DEXTROSE 5 % IV SOLN
500.0000 mg | Freq: Four times a day (QID) | INTRAVENOUS | Status: DC | PRN
  Filled 2014-11-10: qty 5

## 2014-11-10 MED ORDER — LIDOCAINE HCL (CARDIAC) 20 MG/ML IV SOLN
INTRAVENOUS | Status: AC
  Filled 2014-11-10: qty 10

## 2014-11-10 MED ORDER — ROCURONIUM BROMIDE 50 MG/5ML IV SOLN
INTRAVENOUS | Status: AC
  Filled 2014-11-10: qty 1

## 2014-11-10 MED ORDER — ONDANSETRON HCL 4 MG/2ML IJ SOLN
INTRAMUSCULAR | Status: AC
  Filled 2014-11-10: qty 4

## 2014-11-10 MED ORDER — EPHEDRINE SULFATE 50 MG/ML IJ SOLN
INTRAMUSCULAR | Status: AC
  Filled 2014-11-10: qty 1

## 2014-11-10 MED ORDER — MENTHOL 3 MG MT LOZG: 1.0000 | LOZENGE | OROMUCOSAL | Status: DC | PRN

## 2014-11-10 MED ORDER — ROCURONIUM BROMIDE 100 MG/10ML IV SOLN
INTRAVENOUS | Status: DC | PRN
  Administered 2014-11-10: 40 mg via INTRAVENOUS
  Administered 2014-11-10: 10 mg via INTRAVENOUS

## 2014-11-10 MED ORDER — NEOSTIGMINE METHYLSULFATE 10 MG/10ML IV SOLN
INTRAVENOUS | Status: DC | PRN
  Administered 2014-11-10: 5 mg via INTRAVENOUS

## 2014-11-10 MED ORDER — AZITHROMYCIN 250 MG PO TABS: 250.0000 mg | ORAL_TABLET | Freq: Every day | ORAL | Status: DC

## 2014-11-10 MED ORDER — PROPOFOL 10 MG/ML IV BOLUS
INTRAVENOUS | Status: AC
  Filled 2014-11-10: qty 20

## 2014-11-10 MED ORDER — PREDNISONE 10 MG PO TABS: 10.0000 mg | ORAL_TABLET | Freq: Every day | ORAL | Status: DC

## 2014-11-10 MED ORDER — OXYCODONE HCL 5 MG/5ML PO SOLN: 5.0000 mg | Freq: Once | ORAL | Status: AC | PRN

## 2014-11-10 MED ORDER — SODIUM CHLORIDE 0.9 % IJ SOLN: 3.0000 mL | Freq: Two times a day (BID) | INTRAMUSCULAR | Status: DC

## 2014-11-10 MED ORDER — SODIUM CHLORIDE 0.9 % IJ SOLN
INTRAMUSCULAR | Status: AC
  Filled 2014-11-10: qty 20

## 2014-11-10 MED ORDER — SODIUM CHLORIDE 0.9 % IV SOLN: 250.0000 mL | INTRAVENOUS | Status: DC

## 2014-11-10 MED ORDER — BUPIVACAINE HCL (PF) 0.25 % IJ SOLN
INTRAMUSCULAR | Status: DC | PRN
  Administered 2014-11-10: 1 mL

## 2014-11-10 MED ORDER — IPRATROPIUM-ALBUTEROL 0.5-2.5 (3) MG/3ML IN SOLN: 3.0000 mL | Freq: Four times a day (QID) | RESPIRATORY_TRACT | Status: DC | PRN

## 2014-11-10 MED ORDER — OXYCODONE-ACETAMINOPHEN 10-325 MG PO TABS: 1.0000 | ORAL_TABLET | Freq: Four times a day (QID) | ORAL | Status: DC

## 2014-11-10 MED ORDER — ALBUTEROL SULFATE HFA 108 (90 BASE) MCG/ACT IN AERS
INHALATION_SPRAY | RESPIRATORY_TRACT | Status: AC
  Filled 2014-11-10: qty 6.7

## 2014-11-10 MED ORDER — CEFAZOLIN SODIUM 1-5 GM-% IV SOLN
1.0000 g | Freq: Three times a day (TID) | INTRAVENOUS | Status: DC
  Filled 2014-11-10 (×2): qty 50

## 2014-11-10 MED ORDER — PROMETHAZINE HCL 25 MG/ML IJ SOLN: 6.2500 mg | INTRAMUSCULAR | Status: DC | PRN

## 2014-11-10 MED ORDER — MIDAZOLAM HCL 2 MG/2ML IJ SOLN
INTRAMUSCULAR | Status: AC
  Filled 2014-11-10: qty 2

## 2014-11-10 MED ORDER — LACTATED RINGERS IV SOLN
INTRAVENOUS | Status: DC | PRN
  Administered 2014-11-10 (×2): via INTRAVENOUS

## 2014-11-10 MED ORDER — CLONAZEPAM 0.5 MG PO TABS: 1.0000 mg | ORAL_TABLET | Freq: Every day | ORAL | Status: DC

## 2014-11-10 MED ORDER — ONDANSETRON HCL 4 MG/2ML IJ SOLN: 4.0000 mg | INTRAMUSCULAR | Status: DC | PRN

## 2014-11-10 MED ORDER — ARTIFICIAL TEARS OP OINT
TOPICAL_OINTMENT | OPHTHALMIC | Status: AC
  Filled 2014-11-10: qty 3.5

## 2014-11-10 MED ORDER — FENTANYL CITRATE 0.05 MG/ML IJ SOLN
INTRAMUSCULAR | Status: DC | PRN
  Administered 2014-11-10 (×4): 100 ug via INTRAVENOUS
  Administered 2014-11-10 (×2): 50 ug via INTRAVENOUS

## 2014-11-10 MED ORDER — SODIUM CHLORIDE 0.9 % IJ SOLN: 3.0000 mL | INTRAMUSCULAR | Status: DC | PRN

## 2014-11-10 MED ORDER — MORPHINE SULFATE 2 MG/ML IJ SOLN: 1.0000 mg | INTRAMUSCULAR | Status: DC | PRN

## 2014-11-10 MED ORDER — VENLAFAXINE HCL ER 150 MG PO CP24
150.0000 mg | ORAL_CAPSULE | Freq: Every day | ORAL | Status: DC
  Filled 2014-11-10: qty 1

## 2014-11-10 MED ORDER — HYDROMORPHONE HCL 1 MG/ML IJ SOLN
0.2500 mg | INTRAMUSCULAR | Status: DC | PRN
  Administered 2014-11-10 (×4): 0.5 mg via INTRAVENOUS

## 2014-11-10 MED ORDER — GLYCOPYRROLATE 0.2 MG/ML IJ SOLN
INTRAMUSCULAR | Status: AC
  Filled 2014-11-10: qty 3

## 2014-11-10 SURGICAL SUPPLY — 52 items
BAG DECANTER FOR FLEXI CONT (MISCELLANEOUS) ×3 IMPLANT
BENZOIN TINCTURE PRP APPL 2/3 (GAUZE/BANDAGES/DRESSINGS) ×3 IMPLANT
BUR MATCHSTICK NEURO 3.0 LAGG (BURR) ×3 IMPLANT
CANISTER SUCT 3000ML PPV (MISCELLANEOUS) ×3 IMPLANT
CLOSURE WOUND 1/2 X4 (GAUZE/BANDAGES/DRESSINGS) ×1
CONT SPEC 4OZ CLIKSEAL STRL BL (MISCELLANEOUS) ×3 IMPLANT
DRAPE LAPAROTOMY 100X72X124 (DRAPES) ×3 IMPLANT
DRAPE MICROSCOPE LEICA (MISCELLANEOUS) ×3 IMPLANT
DRAPE POUCH INSTRU U-SHP 10X18 (DRAPES) ×3 IMPLANT
DRAPE SURG 17X23 STRL (DRAPES) ×3 IMPLANT
DRSG OPSITE 4X5.5 SM (GAUZE/BANDAGES/DRESSINGS) IMPLANT
DRSG OPSITE POSTOP 3X4 (GAUZE/BANDAGES/DRESSINGS) ×3 IMPLANT
DRSG TELFA 3X8 NADH (GAUZE/BANDAGES/DRESSINGS) IMPLANT
DURAPREP 26ML APPLICATOR (WOUND CARE) ×3 IMPLANT
ELECT REM PT RETURN 9FT ADLT (ELECTROSURGICAL) ×3
ELECTRODE REM PT RTRN 9FT ADLT (ELECTROSURGICAL) ×1 IMPLANT
GAUZE SPONGE 4X4 16PLY XRAY LF (GAUZE/BANDAGES/DRESSINGS) IMPLANT
GLOVE BIO SURGEON STRL SZ 6.5 (GLOVE) ×2 IMPLANT
GLOVE BIO SURGEON STRL SZ7 (GLOVE) ×3 IMPLANT
GLOVE BIO SURGEON STRL SZ8 (GLOVE) ×3 IMPLANT
GLOVE BIO SURGEONS STRL SZ 6.5 (GLOVE) ×1
GLOVE BIOGEL PI IND STRL 6.5 (GLOVE) ×1 IMPLANT
GLOVE BIOGEL PI IND STRL 7.5 (GLOVE) ×1 IMPLANT
GLOVE BIOGEL PI IND STRL 8 (GLOVE) ×1 IMPLANT
GLOVE BIOGEL PI INDICATOR 6.5 (GLOVE) ×2
GLOVE BIOGEL PI INDICATOR 7.5 (GLOVE) ×2
GLOVE BIOGEL PI INDICATOR 8 (GLOVE) ×2
GLOVE ECLIPSE 7.5 STRL STRAW (GLOVE) ×6 IMPLANT
GOWN STRL REUS W/ TWL LRG LVL3 (GOWN DISPOSABLE) ×2 IMPLANT
GOWN STRL REUS W/ TWL XL LVL3 (GOWN DISPOSABLE) ×3 IMPLANT
GOWN STRL REUS W/TWL 2XL LVL3 (GOWN DISPOSABLE) IMPLANT
GOWN STRL REUS W/TWL LRG LVL3 (GOWN DISPOSABLE) ×4
GOWN STRL REUS W/TWL XL LVL3 (GOWN DISPOSABLE) ×6
HEMOSTAT POWDER KIT SURGIFOAM (HEMOSTASIS) ×3 IMPLANT
KIT BASIN OR (CUSTOM PROCEDURE TRAY) ×3 IMPLANT
KIT ROOM TURNOVER OR (KITS) ×3 IMPLANT
NEEDLE HYPO 25X1 1.5 SAFETY (NEEDLE) ×3 IMPLANT
NEEDLE SPNL 20GX3.5 QUINCKE YW (NEEDLE) ×3 IMPLANT
NS IRRIG 1000ML POUR BTL (IV SOLUTION) ×3 IMPLANT
PACK LAMINECTOMY NEURO (CUSTOM PROCEDURE TRAY) ×3 IMPLANT
PAD ARMBOARD 7.5X6 YLW CONV (MISCELLANEOUS) ×15 IMPLANT
RUBBERBAND STERILE (MISCELLANEOUS) ×6 IMPLANT
SPONGE SURGIFOAM ABS GEL SZ50 (HEMOSTASIS) ×3 IMPLANT
STRIP CLOSURE SKIN 1/2X4 (GAUZE/BANDAGES/DRESSINGS) ×2 IMPLANT
SUT VIC AB 0 CT1 18XCR BRD8 (SUTURE) ×1 IMPLANT
SUT VIC AB 0 CT1 8-18 (SUTURE) ×2
SUT VIC AB 2-0 CP2 18 (SUTURE) ×3 IMPLANT
SUT VIC AB 3-0 SH 8-18 (SUTURE) ×3 IMPLANT
SYR 20ML ECCENTRIC (SYRINGE) ×3 IMPLANT
TOWEL OR 17X24 6PK STRL BLUE (TOWEL DISPOSABLE) ×3 IMPLANT
TOWEL OR 17X26 10 PK STRL BLUE (TOWEL DISPOSABLE) ×3 IMPLANT
WATER STERILE IRR 1000ML POUR (IV SOLUTION) ×3 IMPLANT

## 2014-11-10 NOTE — Transfer of Care (Signed)
Immediate Anesthesia Transfer of Care Note  Patient: Alice Santiago  Procedure(s) Performed: Procedure(s) with comments: LUMBAR LAMINECTOMY/DECOMPRESSION MICRODISCECTOMY LEFT LUMBAR FIVE-SACRAL ONE (Left) - left  Patient Location: PACU  Anesthesia Type:General  Level of Consciousness: awake, oriented, sedated, patient cooperative and responds to stimulation  Airway & Oxygen Therapy: Patient Spontanous Breathing and Patient connected to nasal cannula oxygen  Post-op Assessment: Report given to RN, Post -op Vital signs reviewed and stable, Patient moving all extremities and Patient moving all extremities X 4  Post vital signs: Reviewed and stable  Last Vitals:  Filed Vitals:   11/10/14 0900  BP: 127/47  Pulse: 84  Temp: 36.9 C  Resp: 20    Complications: No apparent anesthesia complications

## 2014-11-10 NOTE — Progress Notes (Addendum)
Pt doing well. Pt has ambulating in the hallway without difficulty, she is voiding, and pain is controlled with oral pain meds. Pt's incision is clean and dry with no sign of infection. Pt's IV was removed prior to D/C. Pt D/C'd home via wheelchair @ 1700 per MD order. Pt is stable @ D/C and has no other needs at this time. Rema FendtAshley Arabela Basaldua, RN

## 2014-11-10 NOTE — Op Note (Signed)
11/10/2014  12:13 PM  PATIENT:  Alice Santiago  34 y.o. 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 Jule SerNudleman  ANESTHESIA:   General  EBL: Less than 25 ml  Total I/O In: 1000 [I.V.:1000] Out: -   BLOOD ADMINISTERED:none  DRAINS: None   SPECIMEN:  No Specimen  INDICATION FOR PROCEDURE: This patient presented with severe left leg pain for a long duration. She tried medical management without relief. MRI showed a left L5-S1 herniated disc. I recommended left L5-S1 microdiscectomy.  Patient understood the risks, benefits, and alternatives and potential outcomes and wished to proceed.  PROCEDURE DETAILS: The patient was taken to the operating room and after induction of adequate generalized endotracheal anesthesia, the patient was rolled into the prone position on the Wilson frame and all pressure points were padded. The lumbar region was cleaned and then prepped with DuraPrep and draped in the usual sterile fashion. 5 cc of local anesthesia was injected and then a dorsal midline incision was made and carried down to the lumbo sacral fascia. The fascia was opened and the paraspinous musculature was taken down in a subperiosteal fashion to expose L5-S1 on the left. Intraoperative x-ray confirmed my level, and then I used a combination of the high-speed drill and the Kerrison punches to perform a hemilaminectomy, medial facetectomy, and foraminotomy at L5-S1 on the left. The underlying yellow ligament was opened and removed in a piecemeal fashion to expose the underlying dura and exiting nerve root. I undercut the lateral recess and dissected down until I was medial to and distal to the pedicle. The nerve root was well decompressed.  We then gently retracted the nerve root medially with a retractor, coagulated the epidural venous vasculature, and incised the disc space. A performed a thorough intradiscal discectomy with pituitary rongeurs and curettes, until I had a nice decompression of the nerve root and the midline. I then palpated with a coronary dilator along the nerve root and into the foramen to assure adequate decompression. I felt no more compression of the nerve root. I irrigated with saline solution containing bacitracin. Achieved hemostasis with bipolar cautery, lined the dura with Gelfoam, and then closed the fascia with 0 Vicryl. I closed the subcutaneous tissues with 2-0 Vicryl and the subcuticular tissues with 3-0 Vicryl. The skin was then closed with benzoin and Steri-Strips. The drapes were removed, a sterile dressing was applied. The patient was awakened from general anesthesia and transferred to the recovery room in stable condition. At the end of the procedure all sponge, needle and instrument counts were correct.   PLAN OF CARE: Admit for overnight observation  PATIENT DISPOSITION:  PACU - hemodynamically stable.   Delay start of Pharmacological VTE agent (>24hrs) due to surgical blood loss or risk of bleeding:  yes

## 2014-11-10 NOTE — Anesthesia Postprocedure Evaluation (Signed)
  Anesthesia Post-op Note  Patient: Alice Santiago  Procedure(s) Performed: Procedure(s) with comments: LUMBAR LAMINECTOMY/DECOMPRESSION MICRODISCECTOMY LEFT LUMBAR FIVE-SACRAL ONE (Left) - left  Patient Location: PACU  Anesthesia Type:General  Level of Consciousness: awake and alert   Airway and Oxygen Therapy: Patient Spontanous Breathing  Post-op Pain: none  Post-op Assessment: Post-op Vital signs reviewed  Post-op Vital Signs: Reviewed  Last Vitals:  Filed Vitals:   11/10/14 1333  BP:   Pulse:   Temp: 36.1 C  Resp:     Complications: No apparent anesthesia complications

## 2014-11-10 NOTE — H&P (Signed)
Subjective: Patient is a 34 y.o. female admitted for left L5-S1 microdiscectomy. Onset of symptoms was several months ago, gradually worsening since that time.  The pain is rated severe, and is located at the across the lower back and radiates to left leg. The pain is described as aching and occurs all day. The symptoms have been progressive. Symptoms are exacerbated by exercise. MRI or CT showed left L5-S1 herniated disc   Past Medical History  Diagnosis Date  . Anxiety   . Depression   . Back problem   . Bronchitis     currently being treated for bronchititis,no fever    Past Surgical History  Procedure Laterality Date  . Abdominal hysterectomy    . Tubal ligation    . Cesarean section      times 2    Prior to Admission medications   Medication Sig Start Date End Date Taking? Authorizing Provider  albuterol (PROVENTIL HFA;VENTOLIN HFA) 108 (90 BASE) MCG/ACT inhaler Inhale 2 puffs into the lungs every 6 (six) hours as needed for wheezing or shortness of breath.   Yes Historical Provider, MD  azithromycin (ZITHROMAX) 250 MG tablet Take 250-500 mg by mouth daily. Started 11/05/14 Take 2 tablets (500 mg) 1st day, then take 1 tablet (250 mg) daily on days 2-4   Yes Historical Provider, MD  clonazePAM (KLONOPIN) 0.5 MG tablet Take 1 mg by mouth at bedtime.    Yes Historical Provider, MD  ipratropium-albuterol (DUONEB) 0.5-2.5 (3) MG/3ML SOLN Take 3 mLs by nebulization every 6 (six) hours as needed (wheezing).   Yes Historical Provider, MD  oxyCODONE-acetaminophen (PERCOCET) 10-325 MG per tablet Take 1 tablet by mouth 4 (four) times daily. scheduled   Yes Historical Provider, MD  predniSONE (DELTASONE) 20 MG tablet Take 10-60 mg by mouth daily with breakfast. Tapered course started 11/05/14: take 3 tablets (60 mg) daily for 3 days, then take 2 tablets (40 mg) daily for 3 days, then take 1 tablet (20 mg) daily for 3 days, then take 1/2 tablet (10 mg) daily for 3 days, then stop   Yes Historical  Provider, MD  sertraline (ZOLOFT) 50 MG tablet Take 50 mg by mouth at bedtime.   Yes Historical Provider, MD  topiramate (TOPAMAX) 50 MG tablet Take 50 mg by mouth daily.   Yes Historical Provider, MD  venlafaxine XR (EFFEXOR-XR) 150 MG 24 hr capsule Take 150 mg by mouth at bedtime.   Yes Historical Provider, MD   No Known Allergies  History  Substance Use Topics  . Smoking status: Current Every Day Smoker -- 0.50 packs/day for 21 years    Types: Cigarettes  . Smokeless tobacco: Not on file  . Alcohol Use: No    History reviewed. No pertinent family history.   Review of Systems  Positive ROS: neg  All other systems have been reviewed and were otherwise negative with the exception of those mentioned in the HPI and as above.  Objective: Vital signs in last 24 hours: Temp:  [98.4 F (36.9 C)] 98.4 F (36.9 C) (03/04 0900) Pulse Rate:  [84] 84 (03/04 0900) Resp:  [20] 20 (03/04 0900) BP: (127)/(47) 127/47 mmHg (03/04 0900) SpO2:  [98 %] 98 % (03/04 0900) Weight:  [199 lb (90.266 kg)] 199 lb (90.266 kg) (03/04 0900)  General Appearance: Alert, cooperative, no distress, appears stated age Head: Normocephalic, without obvious abnormality, atraumatic Eyes: PERRL, conjunctiva/corneas clear, EOM's intact    Neck: Supple, symmetrical, trachea midline Back: Symmetric, no curvature, ROM normal, no  CVA tenderness Lungs:  respirations unlabored Heart: Regular rate and rhythm Abdomen: Soft, non-tender Extremities: Extremities normal, atraumatic, no cyanosis or edema Pulses: 2+ and symmetric all extremities Skin: Skin color, texture, turgor normal, no rashes or lesions  NEUROLOGIC:   Mental status: Alert and oriented x4,  no aphasia, good attention span, fund of knowledge, and memory Motor Exam - grossly normal Sensory Exam - grossly normal Reflexes: 1+ Coordination - grossly normal Gait - grossly normal Balance - grossly normal Cranial Nerves: I: smell Not tested  II: visual  acuity  OS: nl    OD: nl  II: visual fields Full to confrontation  II: pupils Equal, round, reactive to light  III,VII: ptosis None  III,IV,VI: extraocular muscles  Full ROM  V: mastication Normal  V: facial light touch sensation  Normal  V,VII: corneal reflex  Present  VII: facial muscle function - upper  Normal  VII: facial muscle function - lower Normal  VIII: hearing Not tested  IX: soft palate elevation  Normal  IX,X: gag reflex Present  XI: trapezius strength  5/5  XI: sternocleidomastoid strength 5/5  XI: neck flexion strength  5/5  XII: tongue strength  Normal    Data Review Lab Results  Component Value Date   WBC 8.7 11/08/2014   HGB 12.6 11/08/2014   HCT 38.4 11/08/2014   MCV 96.7 11/08/2014   PLT 293 11/08/2014   Lab Results  Component Value Date   NA 138 11/08/2014   K 3.6 11/08/2014   CL 110 11/08/2014   CO2 24 11/08/2014   BUN 8 11/08/2014   CREATININE 0.65 11/08/2014   GLUCOSE 113* 11/08/2014   Lab Results  Component Value Date   INR 0.99 11/08/2014    Assessment/Plan: Patient admitted for left L5-S1 microdiscectomy. Patient has failed a reasonable attempt at conservative therapy.  I explained the condition and procedure to the patient and answered any questions.  Patient wishes to proceed with procedure as planned. Understands risks/ benefits and typical outcomes of procedure.   Jazlen Ogarro S 11/10/2014 10:14 AM

## 2014-11-10 NOTE — Anesthesia Preprocedure Evaluation (Addendum)
Anesthesia Evaluation  Patient identified by MRN, date of birth, ID band Patient awake    Reviewed: Allergy & Precautions, NPO status , Patient's Chart, lab work & pertinent test results  Airway Mallampati: II  TM Distance: >3 FB Neck ROM: Full    Dental  (+) Teeth Intact, Dental Advisory Given   Pulmonary Current Smoker,  breath sounds clear to auscultation- rhonchi  - wheezing      Cardiovascular negative cardio ROS  Rhythm:Regular Rate:Normal     Neuro/Psych Anxiety Depression negative neurological ROS     GI/Hepatic negative GI ROS, Neg liver ROS,   Endo/Other  Morbid obesity  Renal/GU negative Renal ROS     Musculoskeletal negative musculoskeletal ROS (+)   Abdominal   Peds  Hematology negative hematology ROS (+)   Anesthesia Other Findings   Reproductive/Obstetrics                           Anesthesia Physical Anesthesia Plan  ASA: II  Anesthesia Plan: General   Post-op Pain Management:    Induction: Intravenous  Airway Management Planned: Oral ETT  Additional Equipment:   Intra-op Plan:   Post-operative Plan: Extubation in OR  Informed Consent: I have reviewed the patients History and Physical, chart, labs and discussed the procedure including the risks, benefits and alternatives for the proposed anesthesia with the patient or authorized representative who has indicated his/her understanding and acceptance.   Dental advisory given  Plan Discussed with: CRNA  Anesthesia Plan Comments:         Anesthesia Quick Evaluation

## 2014-11-10 NOTE — Anesthesia Procedure Notes (Signed)
Procedure Name: Intubation Date/Time: 11/10/2014 10:52 AM Performed by: Wray KearnsFOLEY, Marcelles Clinard A Pre-anesthesia Checklist: Patient identified, Timeout performed, Emergency Drugs available, Suction available and Patient being monitored Patient Re-evaluated:Patient Re-evaluated prior to inductionOxygen Delivery Method: Circle system utilized Preoxygenation: Pre-oxygenation with 100% oxygen Intubation Type: IV induction and Cricoid Pressure applied Ventilation: Mask ventilation without difficulty Laryngoscope Size: Mac and 4 Grade View: Grade I Tube type: Oral Tube size: 7.5 mm Number of attempts: 1 Airway Equipment and Method: Stylet Placement Confirmation: breath sounds checked- equal and bilateral,  ETT inserted through vocal cords under direct vision and positive ETCO2 Secured at: 22 cm Tube secured with: Tape Dental Injury: Teeth and Oropharynx as per pre-operative assessment

## 2014-11-11 NOTE — Discharge Summary (Signed)
Physician Discharge Summary  Patient ID: Alice Santiago MRN: [id number] DOB/AGE: May 31, 1981 34 y.o.  Admit date: 11/10/2014 Discharge date: 11/11/2014  Admission Diagnoses: HNP L5-S1 Left    Discharge Diagnoses: same   Discharged Condition: good  Hospital Course: The patient was admitted on 11/10/2014 and taken to the operating room where the patient underwent L L5-S1 microdiskectomy. The patient tolerated the procedure well and was taken to the recovery room and then to the floor in stable condition. The hospital course was routine. There were no complications. The wound remained clean dry and intact. Pt had appropriate back soreness. No complaints of leg pain or new N/T/W. The patient remained afebrile with stable vital signs, and tolerated a regular diet. The patient continued to increase activities, and pain was well controlled with oral pain medications.   Consults: None  Significant Diagnostic Studies:  Results for orders placed or performed during the hospital encounter of 11/08/14  Surgical pcr screen  Result Value Ref Range   MRSA, PCR NEGATIVE NEGATIVE   Staphylococcus aureus NEGATIVE NEGATIVE  Basic metabolic panel  Result Value Ref Range   Sodium 138 135 - 145 mmol/L   Potassium 3.6 3.5 - 5.1 mmol/L   Chloride 110 96 - 112 mmol/L   CO2 24 19 - 32 mmol/L   Glucose, Bld 113 (H) 70 - 99 mg/dL   BUN 8 6 - 23 mg/dL   Creatinine, Ser 1.19 0.50 - 1.10 mg/dL   Calcium 8.8 8.4 - 14.7 mg/dL   GFR calc non Af Amer >90 >90 mL/min   GFR calc Af Amer >90 >90 mL/min   Anion gap 4 (L) 5 - 15  CBC WITH DIFFERENTIAL  Result Value Ref Range   WBC 8.7 4.0 - 10.5 K/uL   RBC 3.97 3.87 - 5.11 MIL/uL   Hemoglobin 12.6 12.0 - 15.0 g/dL   HCT 82.9 56.2 - 13.0 %   MCV 96.7 78.0 - 100.0 fL   MCH 31.7 26.0 - 34.0 pg   MCHC 32.8 30.0 - 36.0 g/dL   RDW 86.5 78.4 - 69.6 %   Platelets 293 150 - 400 K/uL   Neutrophils Relative % 79 (H) 43 - 77 %   Neutro Abs 6.8 1.7 - 7.7 K/uL   Lymphocytes  Relative 16 12 - 46 %   Lymphs Abs 1.4 0.7 - 4.0 K/uL   Monocytes Relative 5 3 - 12 %   Monocytes Absolute 0.5 0.1 - 1.0 K/uL   Eosinophils Relative 0 0 - 5 %   Eosinophils Absolute 0.0 0.0 - 0.7 K/uL   Basophils Relative 0 0 - 1 %   Basophils Absolute 0.0 0.0 - 0.1 K/uL  Protime-INR  Result Value Ref Range   Prothrombin Time 13.2 11.6 - 15.2 seconds   INR 0.99 0.00 - 1.49    Chest 2 View  11/08/2014   CLINICAL DATA:  Herniated nucleus pulposus, preop.  EXAM: CHEST  2 VIEW  COMPARISON:  05/19/2013.  FINDINGS: Trachea is midline. Heart size normal. Lungs are clear. No pleural fluid.  IMPRESSION: No active cardiopulmonary disease.   Electronically Signed   By: Leanna Battles M.D.   On: 11/08/2014 10:09   Dg Lumbar Spine 2-3 Views  11/10/2014   CLINICAL DATA:  L5-S1 laminectomy  EXAM: LUMBAR SPINE - 2-3 VIEW  COMPARISON:  10/06/2014  FINDINGS: Lateral radiograph of the lumbar spine was obtained reveals 5 lumbar type vertebral bodies. A needle is noted within the posterior soft tissues at the S1  level just below the L5-S1 interspace. No acute abnormality is noted.  IMPRESSION: Intraoperative localization as described.   Electronically Signed   By: Alcide CleverMark  Lukens M.D.   On: 11/10/2014 13:18    Antibiotics:  Anti-infectives    Start     Dose/Rate Route Frequency Ordered Stop   11/10/14 1600  ceFAZolin (ANCEF) IVPB 1 g/50 mL premix  Status:  Discontinued     1 g 100 mL/hr over 30 Minutes Intravenous Every 8 hours 11/10/14 1401 11/10/14 2012   11/10/14 1415  azithromycin (ZITHROMAX) tablet 250-500 mg  Status:  Discontinued     250-500 mg Oral Daily 11/10/14 1401 11/10/14 1407   11/10/14 1138  bacitracin 50,000 Units in sodium chloride irrigation 0.9 % 500 mL irrigation  Status:  Discontinued       As needed 11/10/14 1138 11/10/14 1218   11/10/14 0600  ceFAZolin (ANCEF) IVPB 2 g/50 mL premix     2 g 100 mL/hr over 30 Minutes Intravenous On call to O.R. 11/09/14 1352 11/10/14 1100       Discharge Exam: Blood pressure 143/60, pulse 91, temperature 97 F (36.1 C), temperature source Oral, resp. rate 16, weight 199 lb (90.266 kg), SpO2 97 %. Neurologic: Grossly normal  Incision OK  Discharge Medications:     Medication List    ASK your doctor about these medications        albuterol 108 (90 BASE) MCG/ACT inhaler  Commonly known as:  PROVENTIL HFA;VENTOLIN HFA  Inhale 2 puffs into the lungs every 6 (six) hours as needed for wheezing or shortness of breath.     azithromycin 250 MG tablet  Commonly known as:  ZITHROMAX  Take 250-500 mg by mouth daily. Started 11/05/14 Take 2 tablets (500 mg) 1st day, then take 1 tablet (250 mg) daily on days 2-4     clonazePAM 0.5 MG tablet  Commonly known as:  KLONOPIN  Take 1 mg by mouth at bedtime.     ipratropium-albuterol 0.5-2.5 (3) MG/3ML Soln  Commonly known as:  DUONEB  Take 3 mLs by nebulization every 6 (six) hours as needed (wheezing).     oxyCODONE-acetaminophen 10-325 MG per tablet  Commonly known as:  PERCOCET  Take 1 tablet by mouth 4 (four) times daily. scheduled     predniSONE 20 MG tablet  Commonly known as:  DELTASONE  Take 10-60 mg by mouth daily with breakfast. Tapered course started 11/05/14: take 3 tablets (60 mg) daily for 3 days, then take 2 tablets (40 mg) daily for 3 days, then take 1 tablet (20 mg) daily for 3 days, then take 1/2 tablet (10 mg) daily for 3 days, then stop     sertraline 50 MG tablet  Commonly known as:  ZOLOFT  Take 50 mg by mouth at bedtime.     topiramate 50 MG tablet  Commonly known as:  TOPAMAX  Take 50 mg by mouth daily.     venlafaxine XR 150 MG 24 hr capsule  Commonly known as:  EFFEXOR-XR  Take 150 mg by mouth at bedtime.        Disposition: home   Final Dx: L L5-S1 microdiskectomy       Signed: Caroll Cunnington S 11/11/2014, 8:07 AM

## 2014-11-13 ENCOUNTER — Encounter (HOSPITAL_COMMUNITY): Payer: Self-pay | Admitting: Neurological Surgery

## 2015-03-05 ENCOUNTER — Other Ambulatory Visit: Payer: Self-pay | Admitting: Neurological Surgery

## 2015-03-05 DIAGNOSIS — M5126 Other intervertebral disc displacement, lumbar region: Secondary | ICD-10-CM

## 2015-03-13 ENCOUNTER — Telehealth: Payer: Self-pay | Admitting: Obstetrics & Gynecology

## 2015-03-13 NOTE — Telephone Encounter (Signed)
Pt c/o vaginal irritation, asking to be seen as soon as possible. Pt states she is "miserable and can not go many more days like this." Pt given an appt for tomorrow at 3:45 pm with Cyril MourningJennifer Griffin, NP.

## 2015-03-14 ENCOUNTER — Encounter: Payer: Self-pay | Admitting: Adult Health

## 2015-03-14 ENCOUNTER — Ambulatory Visit (INDEPENDENT_AMBULATORY_CARE_PROVIDER_SITE_OTHER): Payer: Medicaid Other | Admitting: Adult Health

## 2015-03-14 VITALS — BP 110/70 | HR 100 | Ht 63.5 in | Wt 206.5 lb

## 2015-03-14 DIAGNOSIS — A499 Bacterial infection, unspecified: Secondary | ICD-10-CM | POA: Diagnosis not present

## 2015-03-14 DIAGNOSIS — N760 Acute vaginitis: Secondary | ICD-10-CM

## 2015-03-14 DIAGNOSIS — Z113 Encounter for screening for infections with a predominantly sexual mode of transmission: Secondary | ICD-10-CM | POA: Diagnosis not present

## 2015-03-14 DIAGNOSIS — R350 Frequency of micturition: Secondary | ICD-10-CM

## 2015-03-14 DIAGNOSIS — B9689 Other specified bacterial agents as the cause of diseases classified elsewhere: Secondary | ICD-10-CM

## 2015-03-14 DIAGNOSIS — N898 Other specified noninflammatory disorders of vagina: Secondary | ICD-10-CM | POA: Insufficient documentation

## 2015-03-14 DIAGNOSIS — Z1389 Encounter for screening for other disorder: Secondary | ICD-10-CM | POA: Diagnosis not present

## 2015-03-14 DIAGNOSIS — R319 Hematuria, unspecified: Secondary | ICD-10-CM

## 2015-03-14 HISTORY — DX: Other specified bacterial agents as the cause of diseases classified elsewhere: B96.89

## 2015-03-14 LAB — POCT URINALYSIS DIPSTICK
Glucose, UA: NEGATIVE
LEUKOCYTES UA: NEGATIVE
NITRITE UA: NEGATIVE
Protein, UA: NEGATIVE

## 2015-03-14 LAB — POCT WET PREP (WET MOUNT)
Trichomonas Wet Prep HPF POC: NEGATIVE
WBC, Wet Prep HPF POC: POSITIVE

## 2015-03-14 MED ORDER — METRONIDAZOLE 500 MG PO TABS: 500.0000 mg | ORAL_TABLET | Freq: Two times a day (BID) | ORAL | Status: DC

## 2015-03-14 MED ORDER — FLUCONAZOLE 150 MG PO TABS: ORAL_TABLET | ORAL | Status: DC

## 2015-03-14 NOTE — Patient Instructions (Addendum)
Bacterial Vaginosis Bacterial vaginosis is a vaginal infection that occurs when the normal balance of bacteria in the vagina is disrupted. It results from an overgrowth of certain bacteria. This is the most common vaginal infection in women of childbearing age. Treatment is important to prevent complications, especially in pregnant women, as it can cause a premature delivery. CAUSES  Bacterial vaginosis is caused by an increase in harmful bacteria that are normally present in smaller amounts in the vagina. Several different kinds of bacteria can cause bacterial vaginosis. However, the reason that the condition develops is not fully understood. RISK FACTORS Certain activities or behaviors can put you at an increased risk of developing bacterial vaginosis, including:  Having a new sex partner or multiple sex partners.  Douching.  Using an intrauterine device (IUD) for contraception. Women do not get bacterial vaginosis from toilet seats, bedding, swimming pools, or contact with objects around them. SIGNS AND SYMPTOMS  Some women with bacterial vaginosis have no signs or symptoms. Common symptoms include:  Grey vaginal discharge.  A fishlike odor with discharge, especially after sexual intercourse.  Itching or burning of the vagina and vulva.  Burning or pain with urination. DIAGNOSIS  Your health care provider will take a medical history and examine the vagina for signs of bacterial vaginosis. A sample of vaginal fluid may be taken. Your health care provider will look at this sample under a microscope to check for bacteria and abnormal cells. A vaginal pH test may also be done.  TREATMENT  Bacterial vaginosis may be treated with antibiotic medicines. These may be given in the form of a pill or a vaginal cream. A second round of antibiotics may be prescribed if the condition comes back after treatment.  HOME CARE INSTRUCTIONS   Only take over-the-counter or prescription medicines as  directed by your health care provider.  If antibiotic medicine was prescribed, take it as directed. Make sure you finish it even if you start to feel better.  Do not have sex until treatment is completed.  Tell all sexual partners that you have a vaginal infection. They should see their health care provider and be treated if they have problems, such as a mild rash or itching.  Practice safe sex by using condoms and only having one sex partner. SEEK MEDICAL CARE IF:   Your symptoms are not improving after 3 days of treatment.  You have increased discharge or pain.  You have a fever. MAKE SURE YOU:   Understand these instructions.  Will watch your condition.  Will get help right away if you are not doing well or get worse. FOR MORE INFORMATION  Centers for Disease Control and Prevention, Division of STD Prevention: SolutionApps.co.zawww.cdc.gov/std American Sexual Health Association (ASHA): www.ashastd.org  Document Released: 08/25/2005 Document Revised: 06/15/2013 Document Reviewed: 04/06/2013 [facility] Patient Information 2015 [place]. This information is not intended to replace advice given to you by your health care provider. Make sure you discuss any questions you have with your health care provider. No alcohol no sex Take flagyl and diflucan Push water Use condoms

## 2015-03-14 NOTE — Progress Notes (Signed)
Subjective:     Patient ID: Alice Santiago, female   DOB: April 17, 1981, 34 y.o.   MRN: [id number]  HPI Alice is a 34 year old white female, single in complaining of vaginal discharge, urinary frequency and some itching.She took antibiotic about a week ago, and has white discharge, and itching in between the lips.Has new partner of 1 month, and did not use condoms.She has douched.  Review of Systems Patient denies any headaches, hearing loss, fatigue, blurred vision, shortness of breath, chest pain, abdominal pain, problems with bowel movements,  or intercourse. No joint pain or mood swings.See HPI for positives.  Reviewed past medical,surgical, social and family history. Reviewed medications and allergies.     Objective:   Physical Exam BP 110/70 mmHg  Pulse 100  Ht 5' 3.5" (1.613 m)  Wt 206 lb 8 oz (93.668 kg)  BMI 36.00 kg/m2 Skin warm and dry.Pelvic: external genitalia is normal in appearance no lesions, vagina: white discharge with odor,urethra has no lesions or masses noted, cervix and uterus are absent, adnexa: no masses or tenderness noted. Bladder is non tender and no masses felt. Wet prep: + for clue cells and +WBCs + yeast bud GC/CHL obtained. Discussed using condoms and not douching.    Assessment:     Vaginal discharge BV STD screening Hematuria Urinary frequency    Plan:    Use condoms Rx flagyl 500 mg 1 bid x 7 days, no alcohol, review handout on BV, no sex during treatment   Rx diflucan 150 mg #2 take 1 now and 1 in 3 days with 1 refill GC/CHL sent UA C&S sent    Increase water

## 2015-03-15 ENCOUNTER — Ambulatory Visit: Payer: Medicaid Other | Admitting: Obstetrics & Gynecology

## 2015-03-15 ENCOUNTER — Telehealth: Payer: Self-pay | Admitting: Adult Health

## 2015-03-15 NOTE — Telephone Encounter (Signed)
Left message letting pt know GC/CHL still pending. Advised to call back tomorrow at 1pm if she hasn't heard from us. JSY

## 2015-03-16 ENCOUNTER — Telehealth: Payer: Self-pay | Admitting: Adult Health

## 2015-03-16 ENCOUNTER — Telehealth: Payer: Self-pay | Admitting: *Deleted

## 2015-03-16 LAB — URINALYSIS, ROUTINE W REFLEX MICROSCOPIC
Bilirubin, UA: NEGATIVE
GLUCOSE, UA: NEGATIVE
Ketones, UA: NEGATIVE
Nitrite, UA: NEGATIVE
PROTEIN UA: NEGATIVE
RBC, UA: NEGATIVE
Specific Gravity, UA: 1.024 (ref 1.005–1.030)
Urobilinogen, Ur: 0.2 mg/dL (ref 0.2–1.0)
pH, UA: 6 (ref 5.0–7.5)

## 2015-03-16 LAB — MICROSCOPIC EXAMINATION
Casts: NONE SEEN /lpf
Epithelial Cells (non renal): >10 /hpf — AB (ref 0–10)

## 2015-03-16 NOTE — Telephone Encounter (Signed)
Pt informed GC/CHL and urine culture still pending. Pt states will call back on Monday, March 19, 2015 for results.

## 2015-03-16 NOTE — Telephone Encounter (Signed)
Left message letting pt know GC/CHL still pending. Advised can call back later today or Monday. JSY

## 2015-03-17 LAB — URINE CULTURE

## 2015-03-19 ENCOUNTER — Telehealth: Payer: Self-pay | Admitting: Adult Health

## 2015-03-19 ENCOUNTER — Ambulatory Visit: Payer: Medicaid Other | Admitting: Adult Health

## 2015-03-19 LAB — GC/CHLAMYDIA PROBE AMP
Chlamydia trachomatis, NAA: UNDETERMINED
Neisseria gonorrhoeae by PCR: NEGATIVE

## 2015-03-19 MED ORDER — SULFAMETHOXAZOLE-TRIMETHOPRIM 800-160 MG PO TABS: 1.0000 | ORAL_TABLET | Freq: Two times a day (BID) | ORAL | Status: DC

## 2015-03-19 NOTE — Telephone Encounter (Signed)
Pt aware has UTI, and CHL equivocal will repeat it in am, wil rx septra ds for UTI

## 2015-03-20 ENCOUNTER — Ambulatory Visit (INDEPENDENT_AMBULATORY_CARE_PROVIDER_SITE_OTHER): Payer: Medicaid Other | Admitting: Adult Health

## 2015-03-20 ENCOUNTER — Encounter: Payer: Self-pay | Admitting: Adult Health

## 2015-03-20 VITALS — BP 100/80 | HR 100 | Ht 63.5 in | Wt 207.0 lb

## 2015-03-20 DIAGNOSIS — Z113 Encounter for screening for infections with a predominantly sexual mode of transmission: Secondary | ICD-10-CM | POA: Diagnosis not present

## 2015-03-20 NOTE — Progress Notes (Signed)
Subjective:     Patient ID: [place] M Buikema, female   DOB: [birth year], 34 y.o.   MRN: [id number]  HPI [place] is a 34 year old white female in to get GC/CHL rechecked,had equivocal test on CHL from last week, GC was negative, and had UTI, was prescribed septra ds has not started yet, says urine smells strong and cloudy, just finished flagyl for BV.  Review of Systems  Patient denies any headaches, hearing loss, fatigue, blurred vision, shortness of breath, chest pain, abdominal pain, problems with bowel movements, urination, or intercourse. No joint pain or mood swings. Reviewed past medical,surgical, social and family history. Reviewed medications and allergies.     Objective:   Physical Exam BP 100/80 mmHg  Pulse 100  Ht 5' 3.5" (1.613 m)  Wt 207 lb (93.895 kg)  BMI 36.09 kg/m2 Skin warm and dry.Pelvic: external genitalia is normal in appearance no lesions, vagina: white discharge without odor,urethra has no lesions or masses noted, cervix and uterus are absent, adnexa: no masses or tenderness noted. Bladder is non tender and no masses felt. GC/CHL obtained.     Assessment:     STD screening had equivocal CHL    Plan:     GC/CHL sent Take septra ds and push fluids Will talk when test back

## 2015-03-20 NOTE — Patient Instructions (Signed)
Urinary Tract Infection Urinary tract infections (UTIs) can develop anywhere along your urinary tract. Your urinary tract is your body's drainage system for removing wastes and extra water. Your urinary tract includes two kidneys, two ureters, a bladder, and a urethra. Your kidneys are a pair of bean-shaped organs. Each kidney is about the size of your fist. They are located below your ribs, one on each side of your spine. CAUSES Infections are caused by microbes, which are microscopic organisms, including fungi, viruses, and bacteria. These organisms are so small that they can only be seen through a microscope. Bacteria are the microbes that most commonly cause UTIs. SYMPTOMS  Symptoms of UTIs may vary by age and gender of the patient and by the location of the infection. Symptoms in young women typically include a frequent and intense urge to urinate and a painful, burning feeling in the bladder or urethra during urination. Older women and men are more likely to be tired, shaky, and weak and have muscle aches and abdominal pain. A fever may mean the infection is in your kidneys. Other symptoms of a kidney infection include pain in your back or sides below the ribs, nausea, and vomiting. DIAGNOSIS To diagnose a UTI, your caregiver will ask you about your symptoms. Your caregiver also will ask to provide a urine sample. The urine sample will be tested for bacteria and white blood cells. White blood cells are made by your body to help fight infection. TREATMENT  Typically, UTIs can be treated with medication. Because most UTIs are caused by a bacterial infection, they usually can be treated with the use of antibiotics. The choice of antibiotic and length of treatment depend on your symptoms and the type of bacteria causing your infection. HOME CARE INSTRUCTIONS  If you were prescribed antibiotics, take them exactly as your caregiver instructs you. Finish the medication even if you feel better after you  have only taken some of the medication.  Drink enough water and fluids to keep your urine clear or pale yellow.  Avoid caffeine, tea, and carbonated beverages. They tend to irritate your bladder.  Empty your bladder often. Avoid holding urine for long periods of time.  Empty your bladder before and after sexual intercourse.  After a bowel movement, women should cleanse from front to back. Use each tissue only once. SEEK MEDICAL CARE IF:   You have back pain.  You develop a fever.  Your symptoms do not begin to resolve within 3 days. SEEK IMMEDIATE MEDICAL CARE IF:   You have severe back pain or lower abdominal pain.  You develop chills.  You have nausea or vomiting.  You have continued burning or discomfort with urination. MAKE SURE YOU:   Understand these instructions.  Will watch your condition.  Will get help right away if you are not doing well or get worse. Document Released: 06/04/2005 Document Revised: 02/24/2012 Document Reviewed: 10/03/2011 [facility] Patient Information 2015 [employer] [place]. This information is not intended to replace advice given to you by your health care provider. Make sure you discuss any questions you have with your health care provider. Take septra ds  Push fluids Will send GC/CHL

## 2015-03-21 ENCOUNTER — Telehealth: Payer: Self-pay | Admitting: Adult Health

## 2015-03-21 NOTE — Telephone Encounter (Signed)
Spoke with pt letting her know GC/CHL is pending. Advised we will call when results are back. JSY

## 2015-03-21 NOTE — Telephone Encounter (Signed)
Results not back yet

## 2015-03-22 ENCOUNTER — Telehealth: Payer: Self-pay | Admitting: Adult Health

## 2015-03-22 ENCOUNTER — Ambulatory Visit
Admission: RE | Admit: 2015-03-22 | Discharge: 2015-03-22 | Disposition: A | Discharge status: Home / Self Care | Payer: Medicaid Other | Source: Ambulatory Visit | Attending: Neurological Surgery | Admitting: Neurological Surgery

## 2015-03-22 DIAGNOSIS — M5126 Other intervertebral disc displacement, lumbar region: Secondary | ICD-10-CM

## 2015-03-22 MED ORDER — GADOBENATE DIMEGLUMINE 529 MG/ML IV SOLN
20.0000 mL | Freq: Once | INTRAVENOUS | Status: AC | PRN
  Administered 2015-03-22: 20 mL via INTRAVENOUS

## 2015-03-22 NOTE — Telephone Encounter (Signed)
Spoke with pt letting her know CHL is still pending. I advised I would call her when results are ready. Pt voiced understanding. JSY

## 2015-03-22 NOTE — Telephone Encounter (Signed)
Left message x 1. JSY 

## 2015-03-23 ENCOUNTER — Telehealth: Payer: Self-pay | Admitting: *Deleted

## 2015-03-23 NOTE — Telephone Encounter (Signed)
Pt called wanting her GC/CHL results. CHL still pending. Pt aware. JSY

## 2015-03-24 LAB — GC/CHLAMYDIA PROBE AMP
CHLAMYDIA, DNA PROBE: POSITIVE — AB
NEISSERIA GONORRHOEAE BY PCR: NEGATIVE

## 2015-03-26 ENCOUNTER — Telehealth: Payer: Self-pay | Admitting: Adult Health

## 2015-03-26 MED ORDER — AZITHROMYCIN 500 MG PO TABS: ORAL_TABLET | ORAL | Status: DC

## 2015-03-26 NOTE — Telephone Encounter (Signed)
Pt aware Chlamydia is +, will rx azithromycin 500 mg #2 2 po now and do POT 8/15 at 11:30, no sex, [facility]

## 2015-03-27 ENCOUNTER — Telehealth: Payer: Self-pay | Admitting: Adult Health

## 2015-03-27 NOTE — Telephone Encounter (Signed)
Pt states why does she have to wait so long for POT? Informed pt takes time for the abx to take full affect, re-interated no sex. Pt verbalized understanding.

## 2015-04-23 ENCOUNTER — Ambulatory Visit (INDEPENDENT_AMBULATORY_CARE_PROVIDER_SITE_OTHER): Payer: Medicaid Other | Admitting: Adult Health

## 2015-04-23 ENCOUNTER — Encounter: Payer: Self-pay | Admitting: Adult Health

## 2015-04-23 VITALS — BP 112/64 | HR 52 | Ht 63.5 in | Wt 207.5 lb

## 2015-04-23 DIAGNOSIS — Z8619 Personal history of other infectious and parasitic diseases: Secondary | ICD-10-CM | POA: Diagnosis not present

## 2015-04-23 DIAGNOSIS — N644 Mastodynia: Secondary | ICD-10-CM

## 2015-04-23 HISTORY — DX: Personal history of other infectious and parasitic diseases: Z86.19

## 2015-04-23 HISTORY — DX: Mastodynia: N64.4

## 2015-04-23 NOTE — Progress Notes (Signed)
Subjective:     Patient ID: [place] M Mottley, female   DOB: [birth year], 34 y.o.   MRN: [id number]  HPI [place] is a 34 year old white female in for proof of treatment for +chlamydia and says breast tender when dog steps on them.  Review of Systems Patient denies any headaches, hearing loss, fatigue, blurred vision, shortness of breath, chest pain, abdominal pain, problems with bowel movements, urination, or intercourse(not having sex). No joint pain or mood swings. + breast tenderness Reviewed past medical,surgical, social and family history. Reviewed medications and allergies.     Objective:   Physical Exam BP 112/64 mmHg  Pulse 52  Ht 5' 3.5" (1.613 m)  Wt 207 lb 8 oz (94.121 kg)  BMI 36.18 kg/m2 Skin warm and dry,  Breasts:no dominate palpable mass, retraction or nipple discharge    Assessment:     History of  Chlamydia  Breast tenderness     Plan:    Use condoms GC/CHL sent on urine Call if any lumps in breast,decrease caffeine Mammogram at 34

## 2015-04-23 NOTE — Patient Instructions (Addendum)
Use condoms Decrease caffeine,  Call if feels any lumps Mammogram at 39

## 2015-04-24 ENCOUNTER — Telehealth: Payer: Self-pay | Admitting: Adult Health

## 2015-04-24 LAB — GC/CHLAMYDIA PROBE AMP
CHLAMYDIA, DNA PROBE: NEGATIVE
Neisseria gonorrhoeae by PCR: NEGATIVE

## 2015-04-24 NOTE — Telephone Encounter (Signed)
Pt aware gc/chl negative 

## 2015-05-28 ENCOUNTER — Telehealth: Payer: Self-pay | Admitting: Obstetrics & Gynecology

## 2015-05-28 NOTE — Telephone Encounter (Signed)
Pt states had a partial hysterectomy by Dr. Despina Hidden, would like to discuss her risk for ovarian ca due to family HX of ovarian cancer. Appt scheduled for 06/11/2015 at 0915. Pt requesting appt the first of October due to having her co-pay.

## 2015-06-11 ENCOUNTER — Ambulatory Visit (INDEPENDENT_AMBULATORY_CARE_PROVIDER_SITE_OTHER): Payer: Medicaid Other | Admitting: Obstetrics & Gynecology

## 2015-06-11 ENCOUNTER — Encounter: Payer: Self-pay | Admitting: Obstetrics & Gynecology

## 2015-06-11 VITALS — BP 100/60 | HR 80 | Ht 63.0 in | Wt 205.0 lb

## 2015-06-11 DIAGNOSIS — Z113 Encounter for screening for infections with a predominantly sexual mode of transmission: Secondary | ICD-10-CM

## 2015-06-11 DIAGNOSIS — N760 Acute vaginitis: Secondary | ICD-10-CM

## 2015-06-11 DIAGNOSIS — A499 Bacterial infection, unspecified: Secondary | ICD-10-CM | POA: Diagnosis not present

## 2015-06-11 DIAGNOSIS — B9689 Other specified bacterial agents as the cause of diseases classified elsewhere: Secondary | ICD-10-CM

## 2015-06-11 MED ORDER — METRONIDAZOLE 500 MG PO TABS: 500.0000 mg | ORAL_TABLET | Freq: Two times a day (BID) | ORAL | Status: DC

## 2015-06-11 NOTE — Addendum Note (Signed)
Addended by: Federico Flake A on: 06/11/2015 10:24 AM   Modules accepted: Orders

## 2015-06-11 NOTE — Progress Notes (Signed)
Patient ID: Alice Santiago, female   DOB: September 14, 1980, 34 y.o.   MRN: [id number] Chief Complaint  Patient presents with  . discuss family hx ovarian ca.    feeling funny in vaginal area. no discharge. std screening.    Blood pressure 100/60, pulse 80, height  (1.6 m), weight 205 lb (92.987 kg).  34 y.o. [id number] No LMP recorded. Patient has had a hysterectomy. The current method of family planning is status post hysterectomy.  Subjective Pt had some vaginal discharge Recent history of chlamydia, with treatment and test of cure negative  Objective Vulva:  normal appearing vulva with no masses, tenderness or lesions Vagina:  normal mucosa, thin grey discharge Cervix:  absent Uterus:  uterus absent Adnexa: ovaries:present,  normal adnexa in size, nontender and no masses    Pertinent ROS No burning with urination, frequency or urgency No nausea, vomiting or diarrhea Nor fever chills or other constitutional symptoms   Labs or studies Wet prep Wet Prep:   A sample of vaginal discharge was obtained from the posterior fornix using a cotton swab. 2 drops of saline were placed on a slide and the cotton swab was immersed in the saline. Microscopic evaluation was performed and results were as follows:  negative for   for yeast  Positive for clue cells , consistent with Bacterial vaginosis Negative for trichomonas  Normal WBC population   Whiff test: Positive     Impression Diagnoses this Encounter::   ICD-9-CM ICD-10-CM   1. BV (bacterial vaginosis) 616.10 N76.0    041.9 A49.9     Established relevant diagnosis(es): As above  Plan/Recommendations: Meds ordered this encounter  Medications  . ALPRAZolam (XANAX) 1 MG tablet    Sig: Take 1 mg by mouth.  . metroNIDAZOLE (FLAGYL) 500 MG tablet    Sig: Take 1 tablet (500 mg total) by mouth 2 (two) times daily.    Dispense:  14 tablet    Refill:  0    Labs or Scans Ordered: No orders of the defined types were placed in  this encounter.      Follow up Return if symptoms worsen or fail to improve.     All questions were answered.

## 2015-06-12 LAB — GC/CHLAMYDIA PROBE AMP
CHLAMYDIA, DNA PROBE: NEGATIVE
Neisseria gonorrhoeae by PCR: NEGATIVE

## 2015-06-13 ENCOUNTER — Telehealth: Payer: Self-pay | Admitting: Obstetrics & Gynecology

## 2015-06-13 NOTE — Telephone Encounter (Signed)
Pt informed of negative GC/CHL from 06/11/2015.

## 2015-07-19 ENCOUNTER — Other Ambulatory Visit: Payer: Medicaid Other | Admitting: Obstetrics & Gynecology

## 2015-09-25 ENCOUNTER — Ambulatory Visit (INDEPENDENT_AMBULATORY_CARE_PROVIDER_SITE_OTHER): Payer: Medicaid Other | Admitting: Obstetrics & Gynecology

## 2015-09-25 ENCOUNTER — Encounter: Payer: Self-pay | Admitting: Obstetrics & Gynecology

## 2015-09-25 VITALS — BP 100/70 | HR 72 | Ht 63.0 in | Wt 207.0 lb

## 2015-09-25 DIAGNOSIS — Z01419 Encounter for gynecological examination (general) (routine) without abnormal findings: Secondary | ICD-10-CM

## 2015-09-25 DIAGNOSIS — Z0000 Encounter for general adult medical examination without abnormal findings: Secondary | ICD-10-CM | POA: Diagnosis not present

## 2015-09-25 DIAGNOSIS — Z7251 High risk heterosexual behavior: Secondary | ICD-10-CM

## 2015-09-25 NOTE — Progress Notes (Signed)
Patient ID: Alice M Bonenberger, female   DOB: 10-Oct-1980, 35 y.o.   MRN: [id number] Subjective:     Alice Santiago is a 35 y.o. female here for a routine exam.  No LMP recorded. Patient has had a hysterectomy. [id number] Birth Control Method:  hysterectomy Menstrual Calendar(currently): amenorrheic  Current complaints: none.   Current acute medical issues:  none   Recent Gynecologic History No LMP recorded. Patient has had a hysterectomy. Last Pap: ,   Last mammogram: ,    Past Medical History  Diagnosis Date  . Anxiety   . Depression   . Back problem   . Bronchitis     currently being treated for bronchititis,no fever  . Vaginal discharge 03/14/2015  . BV (bacterial vaginosis) 03/14/2015  . Vaginal Pap smear, abnormal   . Chlamydia   . History of chlamydia 04/23/2015  . Breast tenderness in female 04/23/2015    Past Surgical History  Procedure Laterality Date  . Abdominal hysterectomy    . Tubal ligation    . Cesarean section      times 2  . Lumbar laminectomy/decompression microdiscectomy Left 11/10/2014    Procedure: LUMBAR LAMINECTOMY/DECOMPRESSION MICRODISCECTOMY LEFT LUMBAR FIVE-SACRAL ONE;  Surgeon: Tia Alert, MD;  Location: [facility];  Service: Neurosurgery;  Laterality: Left;  left    OB History    Gravida Para Term Preterm AB TAB SAB Ectopic Multiple Living   Social History   Social History  . Marital Status: Single    Spouse Name: N/A  . Number of Children: N/A  . Years of Education: N/A   Social History Main Topics  . Smoking status: Current Every Day Smoker -- 0.50 packs/day for 21 years    Types: Cigarettes  . Smokeless tobacco: Never Used  . Alcohol Use: Yes     Comment: occ  . Drug Use: No  . Sexual Activity: Not Currently    Birth Control/ Protection: Surgical     Comment: hyst   Other Topics Concern  . None   Social History Narrative    Family History  Problem Relation Age of Onset  . Depression Mother   . Anxiety  disorder Mother   . Heart murmur Father   . Other Son     problems with bowels  . Asthma Son   . ADD / ADHD Son   . ODD Son   . Asthma Son   . Cancer Neg Hx      Current outpatient prescriptions:  .  albuterol (PROVENTIL HFA;VENTOLIN HFA) 108 (90 BASE) MCG/ACT inhaler, Inhale 2 puffs into the lungs every 6 (six) hours as needed for wheezing or shortness of breath., Disp: , Rfl:  .  ALPRAZolam (XANAX) 1 MG tablet, Take 1 mg by mouth., Disp: , Rfl:  .  mirtazapine (REMERON) 15 MG tablet, Take 15 mg by mouth at bedtime., Disp: , Rfl:  .  ranitidine (ZANTAC) 300 MG tablet, Take 300 mg by mouth at bedtime., Disp: , Rfl:  .  venlafaxine XR (EFFEXOR-XR) 150 MG 24 hr capsule, Take 75 mg by mouth as needed. , Disp: , Rfl:   Review of Systems  Review of Systems  Constitutional: Negative for fever, chills, weight loss, malaise/fatigue and diaphoresis.  HENT: Negative for hearing loss, ear pain, nosebleeds, congestion, sore throat, neck pain, tinnitus and ear discharge.   Eyes: Negative for blurred vision, double vision, photophobia, pain, discharge  and redness.  Respiratory: Negative for cough, hemoptysis, sputum production, shortness of breath, wheezing and stridor.   Cardiovascular: Negative for chest pain, palpitations, orthopnea, claudication, leg swelling and PND.  Gastrointestinal: negative for abdominal pain. Negative for heartburn, nausea, vomiting, diarrhea, constipation, blood in stool and melena.  Genitourinary: Negative for dysuria, urgency, frequency, hematuria and flank pain.  Musculoskeletal: Negative for myalgias, back pain, joint pain and falls.  Skin: Negative for itching and rash.  Neurological: Negative for dizziness, tingling, tremors, sensory change, speech change, focal weakness, seizures, loss of consciousness, weakness and headaches.  Endo/Heme/Allergies: Negative for environmental allergies and polydipsia. Does not bruise/bleed easily.  Psychiatric/Behavioral: Negative  for depression, suicidal ideas, hallucinations, memory loss and substance abuse. The patient is not nervous/anxious and does not have insomnia.        Objective:  Blood pressure 100/70, pulse 72, height  (1.6 m), weight 207 lb (93.895 kg).   Physical Exam  Vitals reviewed. Constitutional: She is oriented to person, place, and time. She appears well-developed and well-nourished.  HENT:  Head: Normocephalic and atraumatic.        Right Ear: External ear normal.  Left Ear: External ear normal.  Nose: Nose normal.  Mouth/Throat: Oropharynx is clear and moist.  Eyes: Conjunctivae and EOM are normal. Pupils are equal, round, and reactive to light. Right eye exhibits no discharge. Left eye exhibits no discharge. No scleral icterus.  Neck: Normal range of motion. Neck supple. No tracheal deviation present. No thyromegaly present.  Cardiovascular: Normal rate, regular rhythm, normal heart sounds and intact distal pulses.  Exam reveals no gallop and no friction rub.   No murmur heard. Respiratory: Effort normal and breath sounds normal. No respiratory distress. She has no wheezes. She has no rales. She exhibits no tenderness.  GI: Soft. Bowel sounds are normal. She exhibits no distension and no mass. There is no tenderness. There is no rebound and no guarding.  Genitourinary:  Breasts no masses skin changes or nipple changes bilaterally      Vulva is normal without lesions Vagina is pink moist without discharge Cervix absent9} Uterus isabsent Adnexa is negative with normal sized ovaries   Musculoskeletal: Normal range of motion. She exhibits no edema and no tenderness.  Neurological: She is alert and oriented to person, place, and time. She has normal reflexes. She displays normal reflexes. No cranial nerve deficit. She exhibits normal muscle tone. Coordination normal.  Skin: Skin is warm and dry. No rash noted. No erythema. No pallor.  Psychiatric: She has a normal mood and affect. Her  behavior is normal. Judgment and thought content normal.       Assessment:    Healthy female exam.    Plan:    Follow up in: 1 year.    Meds ordered this encounter  Medications  . ranitidine (ZANTAC) 300 MG tablet    Sig: Take 300 mg by mouth at bedtime.  . mirtazapine (REMERON) 15 MG tablet    Sig: Take 15 mg by mouth at bedtime.

## 2015-09-26 LAB — GC/CHLAMYDIA PROBE AMP
CHLAMYDIA, DNA PROBE: NEGATIVE
NEISSERIA GONORRHOEAE BY PCR: NEGATIVE

## 2015-09-26 LAB — HEPATITIS C ANTIBODY: Hep C Virus Ab: <0.1 s/co ratio (ref 0.0–0.9)

## 2015-09-26 LAB — RPR: RPR Ser Ql: NONREACTIVE

## 2015-09-26 LAB — HIV ANTIBODY (ROUTINE TESTING W REFLEX): HIV Screen 4th Generation wRfx: NONREACTIVE

## 2015-09-27 ENCOUNTER — Telehealth: Payer: Self-pay | Admitting: *Deleted

## 2015-09-27 NOTE — Telephone Encounter (Signed)
Pt informed of negative results from GC/CHL, RPR, HIV from 09/25/2015.

## 2016-09-29 ENCOUNTER — Other Ambulatory Visit: Payer: Medicaid Other | Admitting: Obstetrics & Gynecology

## 2016-12-02 ENCOUNTER — Encounter: Payer: Self-pay | Admitting: Allergy and Immunology

## 2016-12-02 ENCOUNTER — Encounter (INDEPENDENT_AMBULATORY_CARE_PROVIDER_SITE_OTHER): Payer: Self-pay

## 2016-12-02 ENCOUNTER — Ambulatory Visit (INDEPENDENT_AMBULATORY_CARE_PROVIDER_SITE_OTHER): Payer: Medicaid Other | Admitting: Allergy and Immunology

## 2016-12-02 VITALS — BP 124/70 | HR 76 | Temp 97.4°F | Resp 20 | Ht 63.58 in | Wt 183.0 lb

## 2016-12-02 DIAGNOSIS — H1010 Acute atopic conjunctivitis, unspecified eye: Secondary | ICD-10-CM | POA: Insufficient documentation

## 2016-12-02 DIAGNOSIS — J3089 Other allergic rhinitis: Secondary | ICD-10-CM | POA: Insufficient documentation

## 2016-12-02 DIAGNOSIS — H1013 Acute atopic conjunctivitis, bilateral: Secondary | ICD-10-CM

## 2016-12-02 DIAGNOSIS — J4540 Moderate persistent asthma, uncomplicated: Secondary | ICD-10-CM | POA: Insufficient documentation

## 2016-12-02 DIAGNOSIS — J302 Other seasonal allergic rhinitis: Secondary | ICD-10-CM | POA: Insufficient documentation

## 2016-12-02 DIAGNOSIS — J4541 Moderate persistent asthma with (acute) exacerbation: Secondary | ICD-10-CM | POA: Diagnosis not present

## 2016-12-02 MED ORDER — MONTELUKAST SODIUM 10 MG PO TABS
10.0000 mg | ORAL_TABLET | Freq: Every day | ORAL | 5 refills | Status: DC
Start: 2016-12-02 — End: 2017-09-29

## 2016-12-02 MED ORDER — OLOPATADINE HCL 0.1 % OP SOLN: 1.0000 [drp] | Freq: Two times a day (BID) | OPHTHALMIC | 5 refills | Status: DC

## 2016-12-02 MED ORDER — EPINEPHRINE 0.3 MG/0.3ML IJ SOAJ: 0.3000 mg | Freq: Once | INTRAMUSCULAR | 1 refills | Status: AC

## 2016-12-02 MED ORDER — FLUTICASONE PROPIONATE HFA 110 MCG/ACT IN AERO: 2.0000 | INHALATION_SPRAY | Freq: Two times a day (BID) | RESPIRATORY_TRACT | 5 refills | Status: DC

## 2016-12-02 MED ORDER — ALBUTEROL SULFATE HFA 108 (90 BASE) MCG/ACT IN AERS: 2.0000 | INHALATION_SPRAY | Freq: Four times a day (QID) | RESPIRATORY_TRACT | 1 refills | Status: DC | PRN

## 2016-12-02 MED ORDER — LEVOCETIRIZINE DIHYDROCHLORIDE 5 MG PO TABS
5.0000 mg | ORAL_TABLET | Freq: Every evening | ORAL | 5 refills | Status: DC
Start: 2016-12-02 — End: 2016-12-10

## 2016-12-02 MED ORDER — FLUTICASONE PROPIONATE 50 MCG/ACT NA SUSP: 2.0000 | Freq: Every day | NASAL | 5 refills | Status: DC

## 2016-12-02 NOTE — Patient Instructions (Addendum)
Perennial and seasonal allergic rhinitis  Aeroallergen avoidance measures have been discussed and provided in written form.  A prescription has been provided for levocetirizine, 5mg  daily as needed.  This medication is to be used for runny nose, itchy nose, sneezing, and itchy eyes.  A prescription has been provided for fluticasone nasal spray, 2 sprays per nostril daily as needed. Proper nasal spray technique has been discussed and demonstrated.  Nasal saline lavage (NeilMed) has been recommended prior to medicated nasal sprays and as needed along with instructions for proper administration.  For thick post nasal drainage, nasal congestion, and/or sinus pressure, add guaifenesin [phone] mg (Mucinex) plus/minus pseudoephedrine 60-120 mg  twice daily as needed with adequate hydration as discussed. Pseudoephedrine is only to be used for short-term relief of nasal/sinus congestion. Long-term use is discouraged due to potential side effects.  The risks and benefits of aeroallergen immunotherapy have been discussed. The patient is motivated to initiate immunotherapy to reduce symptoms and decrease medication requirement. Informed consent has been signed and allergen vaccine orders have been submitted. Medications will be decreased or discontinued as symptom relief from immunotherapy becomes evident.  Allergic conjunctivitis  Treatment plan as outlined above for allergic rhinitis.  A prescription has been provided for Patanol, one drop per eye twice daily as needed.  Moderate persistent asthma  Tobacco cessation has been discussed and encouraged.  A prescription has been provided for Flovent 110 g, 2 inhalations twice a day.  To maximize pulmonary deposition, a spacer has been provided along with instructions for its proper administration with an HFA inhaler.  A prescription has been provided for montelukast 10 mg daily at bedtime.  A prescription has been provided for albuterol HFA, 1-2  inhalations every 4-6 hours as needed and 15 minutes prior to physical exertion.  Subjective and objective measures of pulmonary function will be followed and the treatment plan will be adjusted accordingly.   Return in about 3 months (around 03/04/2017), or if symptoms worsen or fail to improve.  Reducing Pollen Exposure  The American Academy of Allergy, Asthma and Immunology suggests the following steps to reduce your exposure to pollen during allergy seasons.    1. Do not hang sheets or clothing out to dry; pollen may collect on these items. 2. Do not mow lawns or spend time around freshly cut grass; mowing stirs up pollen. 3. Keep windows closed at night.  Keep car windows closed while driving. 4. Minimize morning activities outdoors, a time when pollen counts are usually at their highest. 5. Stay indoors as much as possible when pollen counts or humidity is high and on windy days when pollen tends to remain in the air longer. 6. Use air conditioning when possible.  Many air conditioners have filters that trap the pollen spores. 7. Use a HEPA room air filter to remove pollen form the indoor air you breathe.   Control of Dog or Cat Allergen  Avoidance is the best way to manage a dog or cat allergy. If you have a dog or cat and are allergic to dog or cats, consider removing the dog or cat from the home. If you have a dog or cat but don't want to find it a new home, or if your family wants a pet even though someone in the household is allergic, here are some strategies that may help keep symptoms at bay:  1. Keep the pet out of your bedroom and restrict it to only a few rooms. Be advised that keeping the  dog or cat in only one room will not limit the allergens to that room. 2. Don't pet, hug or kiss the dog or cat; if you do, wash your hands with soap and water. 3. High-efficiency particulate air (HEPA) cleaners run continuously in a bedroom or living room can reduce allergen levels over  time. 4. Regular use of a high-efficiency vacuum cleaner or a central vacuum can reduce allergen levels. 5. Giving your dog or cat a bath at least once a week can reduce airborne allergen.  Control of House Dust Mite Allergen  House dust mites play a major role in allergic asthma and rhinitis.  They occur in environments with high humidity wherever human skin, the food for dust mites is found. High levels have been detected in dust obtained from mattresses, pillows, carpets, upholstered furniture, bed covers, clothes and soft toys.  The principal allergen of the house dust mite is found in its feces.  A gram of dust may contain 1,000 mites and 250,000 fecal particles.  Mite antigen is easily measured in the air during house cleaning activities.    1. Encase mattresses, including the box spring, and pillow, in an air tight cover.  Seal the zipper end of the encased mattresses with wide adhesive tape. 2. Wash the bedding in water of 130 degrees Farenheit weekly.  Avoid cotton comforters/quilts and flannel bedding: the most ideal bed covering is the dacron comforter. 3. Remove all upholstered furniture from the bedroom. 4. Remove carpets, carpet padding, rugs, and non-washable window drapes from the bedroom.  Wash drapes weekly or use plastic window coverings. 5. Remove all non-washable stuffed toys from the bedroom.  Wash stuffed toys weekly. 6. Have the room cleaned frequently with a vacuum cleaner and a damp dust-mop.  The patient should not be in a room which is being cleaned and should wait 1 hour after cleaning before going into the room. 7. Close and seal all heating outlets in the bedroom.  Otherwise, the room will become filled with dust-laden air.  An electric heater can be used to heat the room. 8. Reduce indoor humidity to less than 50%.  Do not use a humidifier.

## 2016-12-02 NOTE — Assessment & Plan Note (Addendum)
   Aeroallergen avoidance measures have been discussed and provided in written form.  A prescription has been provided for levocetirizine, 5mg  daily as needed.  This medication is to be used for runny nose, itchy nose, sneezing, and itchy eyes.  A prescription has been provided for fluticasone nasal spray, 2 sprays per nostril daily as needed. Proper nasal spray technique has been discussed and demonstrated.  Nasal saline lavage (NeilMed) has been recommended prior to medicated nasal sprays and as needed along with instructions for proper administration.  For thick post nasal drainage, nasal congestion, and/or sinus pressure, add guaifenesin [phone] mg (Mucinex) plus/minus pseudoephedrine 60-120 mg  twice daily as needed with adequate hydration as discussed. Pseudoephedrine is only to be used for short-term relief of nasal/sinus congestion. Long-term use is discouraged due to potential side effects.  The risks and benefits of aeroallergen immunotherapy have been discussed. The patient is motivated to initiate immunotherapy to reduce symptoms and decrease medication requirement. Informed consent has been signed and allergen vaccine orders have been submitted. Medications will be decreased or discontinued as symptom relief from immunotherapy becomes evident.

## 2016-12-02 NOTE — Assessment & Plan Note (Addendum)
   Tobacco cessation has been discussed and encouraged.  A prescription has been provided for Flovent 110 g, 2 inhalations twice a day.  To maximize pulmonary deposition, a spacer has been provided along with instructions for its proper administration with an HFA inhaler.  A prescription has been provided for montelukast 10 mg daily at bedtime.  A prescription has been provided for albuterol HFA, 1-2 inhalations every 4-6 hours as needed and 15 minutes prior to physical exertion.  Subjective and objective measures of pulmonary function will be followed and the treatment plan will be adjusted accordingly.

## 2016-12-02 NOTE — Progress Notes (Signed)
New Patient Note  RE: Alice M Kirschenmann MRN: [id number] DOB: 1981/01/11 Date of Office Visit: 12/02/2016  Referring provider: Louie Boston., MD Primary care provider: Louie Boston, MD  Chief Complaint: Asthma; Nasal Congestion; Sinus Problem; and Breathing Problem   History of present illness: Alice Santiago is a 36 y.o. female seen today in consultation requested by Wyvonnia Lora, MD.  She complains of nasal congestion, rhinorrhea, postnasal drainage, sinus pressure over the forehead and between the eyes, as well as occasional watery eyes and eye crusting in the morning. No significant seasonal symptom variation has been noted nor have specific environmental triggers been identified.  At times, she has difficulty distinguishing sinus headaches from migraine headaches.  She also complains of frequent dyspnea, chest tightness, coughing, and occasional wheezing.  Specific triggers for these lower respiratory symptoms include heat/humidity, upper respiratory tract infections, physical exertion, and pollen exposure.  She recently ran out of albuterol.  She has smoked between half pack of cigarettes per day and one pack per day over the past 23 years.    Assessment and plan: Perennial and seasonal allergic rhinitis  Aeroallergen avoidance measures have been discussed and provided in written form.  A prescription has been provided for levocetirizine, 5mg  daily as needed.  This medication is to be used for runny nose, itchy nose, sneezing, and itchy eyes.  A prescription has been provided for fluticasone nasal spray, 2 sprays per nostril daily as needed. Proper nasal spray technique has been discussed and demonstrated.  Nasal saline lavage (NeilMed) has been recommended prior to medicated nasal sprays and as needed along with instructions for proper administration.  For thick post nasal drainage, nasal congestion, and/or sinus pressure, add guaifenesin [phone] mg (Mucinex) plus/minus  pseudoephedrine 60-120 mg  twice daily as needed with adequate hydration as discussed. Pseudoephedrine is only to be used for short-term relief of nasal/sinus congestion. Long-term use is discouraged due to potential side effects.  The risks and benefits of aeroallergen immunotherapy have been discussed. The patient is motivated to initiate immunotherapy to reduce symptoms and decrease medication requirement. Informed consent has been signed and allergen vaccine orders have been submitted. Medications will be decreased or discontinued as symptom relief from immunotherapy becomes evident.  Allergic conjunctivitis  Treatment plan as outlined above for allergic rhinitis.  A prescription has been provided for Patanol, one drop per eye twice daily as needed.  Moderate persistent asthma  Tobacco cessation has been discussed and encouraged.  A prescription has been provided for Flovent 110 g, 2 inhalations twice a day.  To maximize pulmonary deposition, a spacer has been provided along with instructions for its proper administration with an HFA inhaler.  A prescription has been provided for montelukast 10 mg daily at bedtime.  A prescription has been provided for albuterol HFA, 1-2 inhalations every 4-6 hours as needed and 15 minutes prior to physical exertion.  Subjective and objective measures of pulmonary function will be followed and the treatment plan will be adjusted accordingly.   Meds ordered this encounter  Medications  . levocetirizine (XYZAL) 5 MG tablet    Sig: Take 1 tablet (5 mg total) by mouth every evening.    Dispense:  30 tablet    Refill:  5  . fluticasone (FLONASE) 50 MCG/ACT nasal spray    Sig: Place 2 sprays into both nostrils daily.    Dispense:  16 g    Refill:  5  . olopatadine (PATANOL) 0.1 % ophthalmic solution    Sig:  Place 1 drop into both eyes 2 (two) times daily.    Dispense:  5 mL    Refill:  5  . fluticasone (FLOVENT HFA) 110 MCG/ACT inhaler    Sig:  Inhale 2 puffs into the lungs 2 (two) times daily.    Dispense:  1 Inhaler    Refill:  5  . montelukast (SINGULAIR) 10 MG tablet    Sig: Take 1 tablet (10 mg total) by mouth at bedtime.    Dispense:  30 tablet    Refill:  5  . albuterol (PROVENTIL HFA;VENTOLIN HFA) 108 (90 Base) MCG/ACT inhaler    Sig: Inhale 2 puffs into the lungs every 6 (six) hours as needed for wheezing or shortness of breath.    Dispense:  1 Inhaler    Refill:  1  . EPINEPHrine (EPIPEN 2-PAK) 0.3 mg/0.3 mL IJ SOAJ injection    Sig: Inject 0.3 mLs (0.3 mg total) into the muscle once.    Dispense:  2 Device    Refill:  1    Diagnostics: Spirometry: FVC was 2.88 L (86% predicted) and FEV1 was 2.18 L (7075% predicted) with significant (310 mL, 14%) postbronchodilator improvement.  Please see scanned spirometry results for details. Epicutaneous testing: Positive to grass pollens, tree pollen, cat hair, and dust mite antigen. Intradermal testing: Positive to ragweed pollen and dog epithelia.    Physical examination: Blood pressure 124/70, pulse 76, temperature 97.4 F (36.3 C), temperature source Oral, resp. rate 20, height 5' 3.58" (1.615 m), weight 183 lb (83 kg), SpO2 97 %.  General: Alert, interactive, in no acute distress. HEENT: TMs pearly gray, turbinates edematous with thick discharge, post-pharynx moderately erythematous. Neck: Supple without lymphadenopathy. Lungs: Mildly decreased breath sounds bilaterally without wheezing, rhonchi or rales. CV: Normal S1, S2 without murmurs. Abdomen: Nondistended, nontender. Skin: Warm and dry, without lesions or rashes. Extremities:  No clubbing, cyanosis or edema. Neuro:   Grossly intact.  Review of systems:  Review of systems negative except as noted in HPI / PMHx or noted below: Review of Systems  Constitutional: Negative.   HENT: Negative.   Eyes: Negative.   Respiratory: Negative.   Cardiovascular: Negative.   Gastrointestinal: Negative.   Genitourinary:  Negative.   Musculoskeletal: Negative.   Skin: Negative.   Neurological: Negative.   Endo/Heme/Allergies: Negative.   Psychiatric/Behavioral: Negative.     Past medical history:  Past Medical History:  Diagnosis Date  . Anxiety   . Asthma   . Back problem   . Breast tenderness in female 04/23/2015  . Bronchitis    currently being treated for bronchititis,no fever  . BV (bacterial vaginosis) 03/14/2015  . Chlamydia   . Depression   . History of chlamydia 04/23/2015  . Vaginal discharge 03/14/2015  . Vaginal Pap smear, abnormal     Past surgical history:  Past Surgical History:  Procedure Laterality Date  . ABDOMINAL HYSTERECTOMY    . CESAREAN SECTION     times 2  . LUMBAR LAMINECTOMY/DECOMPRESSION MICRODISCECTOMY Left 11/10/2014   Procedure: LUMBAR LAMINECTOMY/DECOMPRESSION MICRODISCECTOMY LEFT LUMBAR FIVE-SACRAL ONE;  Surgeon: Tia Alert, MD;  Location: [facility];  Service: Neurosurgery;  Laterality: Left;  left  . TONSILLECTOMY    . TUBAL LIGATION      Family history: Family History  Problem Relation Age of Onset  . Depression Mother   . Anxiety disorder Mother   . Heart murmur Father   . Other Son     problems with bowels  . Asthma  Son   . ADD / ADHD Son   . ODD Son   . Asthma Son   . Cancer Neg Hx     Social history: Social History   Social History  . Marital status: Single    Spouse name: N/A  . Number of children: N/A  . Years of education: N/A   Occupational History  . Not on file.   Social History Main Topics  . Smoking status: Current Every Day Smoker    Packs/day: 0.50    Years: 21.00    Types: Cigarettes  . Smokeless tobacco: Never Used  . Alcohol use Yes     Comment: occ  . Drug use: No  . Sexual activity: Not Currently    Birth control/ protection: Surgical     Comment: hyst   Other Topics Concern  . Not on file   Social History Narrative  . No narrative on file   Environmental History: The patient lives in a trailer with  tiled floors throughout, electric heat, and window air conditioning units.  There are 2 dogs and 2 birds in the trailer without access to her bedroom.  She has smoked cigarettes since she was 36 years old and smokes between one half and one pack cigarettes per day on average.  There is known mold/water damage in the trailer.  Allergies as of 12/02/2016   No Known Allergies     Medication List       Accurate as of 12/02/16  1:12 PM. Always use your most recent med list.          albuterol 108 (90 Base) MCG/ACT inhaler Commonly known as:  PROVENTIL HFA;VENTOLIN HFA Inhale 2 puffs into the lungs every 6 (six) hours as needed for wheezing or shortness of breath.   ALPRAZolam 1 MG tablet Commonly known as:  XANAX Take 1 mg by mouth.   EPINEPHrine 0.3 mg/0.3 mL Soaj injection Commonly known as:  EPIPEN 2-PAK Inject 0.3 mLs (0.3 mg total) into the muscle once.   fluticasone 110 MCG/ACT inhaler Commonly known as:  FLOVENT HFA Inhale 2 puffs into the lungs 2 (two) times daily.   fluticasone 50 MCG/ACT nasal spray Commonly known as:  FLONASE Place 2 sprays into both nostrils daily.   levocetirizine 5 MG tablet Commonly known as:  XYZAL Take 1 tablet (5 mg total) by mouth every evening.   montelukast 10 MG tablet Commonly known as:  SINGULAIR Take 1 tablet (10 mg total) by mouth at bedtime.   olopatadine 0.1 % ophthalmic solution Commonly known as:  PATANOL Place 1 drop into both eyes 2 (two) times daily.   venlafaxine XR 75 MG 24 hr capsule Commonly known as:  EFFEXOR-XR TAKE 1 CAPSULE BY MOUTH ONCE A DAY FOR DEPRESSION       Known medication allergies: No Known Allergies  I appreciate the opportunity to take part in Harmoney's care. Please do not hesitate to contact me with questions.  Sincerely,   R. Jorene Guestarter Raghav Verrilli, MD

## 2016-12-02 NOTE — Assessment & Plan Note (Addendum)
   Treatment plan as outlined above for allergic rhinitis.  A prescription has been provided for Patanol, one drop per eye twice daily as needed. 

## 2016-12-03 DIAGNOSIS — J301 Allergic rhinitis due to pollen: Secondary | ICD-10-CM

## 2016-12-04 DIAGNOSIS — J3089 Other allergic rhinitis: Secondary | ICD-10-CM

## 2016-12-10 ENCOUNTER — Other Ambulatory Visit: Payer: Self-pay | Admitting: *Deleted

## 2016-12-10 ENCOUNTER — Telehealth: Payer: Self-pay | Admitting: Allergy and Immunology

## 2016-12-10 MED ORDER — CETIRIZINE HCL 10 MG PO TABS: 10.0000 mg | ORAL_TABLET | Freq: Every day | ORAL | 5 refills | Status: DC

## 2016-12-10 NOTE — Telephone Encounter (Signed)
Patient called back.  Patient states insurance does not cover Xyzal.  Patient will try Cetirizine.  Offered to bring sample of Xyzal to patient to [place] office on Tuesday for patient to try.  She states she does not have money for medications or even gas money.  Patient does have appointment to start allergy injections Tuesday.  She was concerned about weight gain and I assured her that it was not a side effect of allergy injections.   Patient was also stated she had raised area from ID and was instructed to use hydrocortisone cream.

## 2016-12-10 NOTE — Telephone Encounter (Signed)
Pt called and said that [employer] will not cover the Xyzal, and also she is still broke out on her arms. [facility] [place].

## 2016-12-10 NOTE — Telephone Encounter (Signed)
Called patient. Left VM to call office.

## 2016-12-16 ENCOUNTER — Ambulatory Visit (INDEPENDENT_AMBULATORY_CARE_PROVIDER_SITE_OTHER): Payer: Medicaid Other | Admitting: *Deleted

## 2016-12-16 DIAGNOSIS — J309 Allergic rhinitis, unspecified: Secondary | ICD-10-CM

## 2016-12-16 NOTE — Progress Notes (Signed)
Immunotherapy   Patient Details  Name: Alice Santiago MRN: [id number] Date of Birth: [birth year]  12/16/2016  Alice Santiago : Patient started allergy injections.  Grass/RW/Tree and DM/Cat/Dog, Blue Vial 1:100,000 0.05 given of each vial. Following schedule: A  Frequency: 1-2 times per week with at least 72 hours in between. Epi-Pen: Patient has Epipen and has been instructed on proper use.  Consent signed and patient instructions given. Patient waited 30 minutes after injection and no reaction noted.   Shelba Flake 12/16/2016, 12:11 PM

## 2016-12-29 ENCOUNTER — Other Ambulatory Visit: Payer: Self-pay | Admitting: Allergy and Immunology

## 2016-12-29 DIAGNOSIS — J4541 Moderate persistent asthma with (acute) exacerbation: Secondary | ICD-10-CM

## 2017-01-14 IMAGING — CR DG CHEST 2V
2 series · 2 of 2 positions shown · non-contrast
Comparison: 05/19/2013.

CLINICAL DATA: Herniated nucleus pulposus, preop.

EXAM:
CHEST  2 VIEW

[w chest pa]
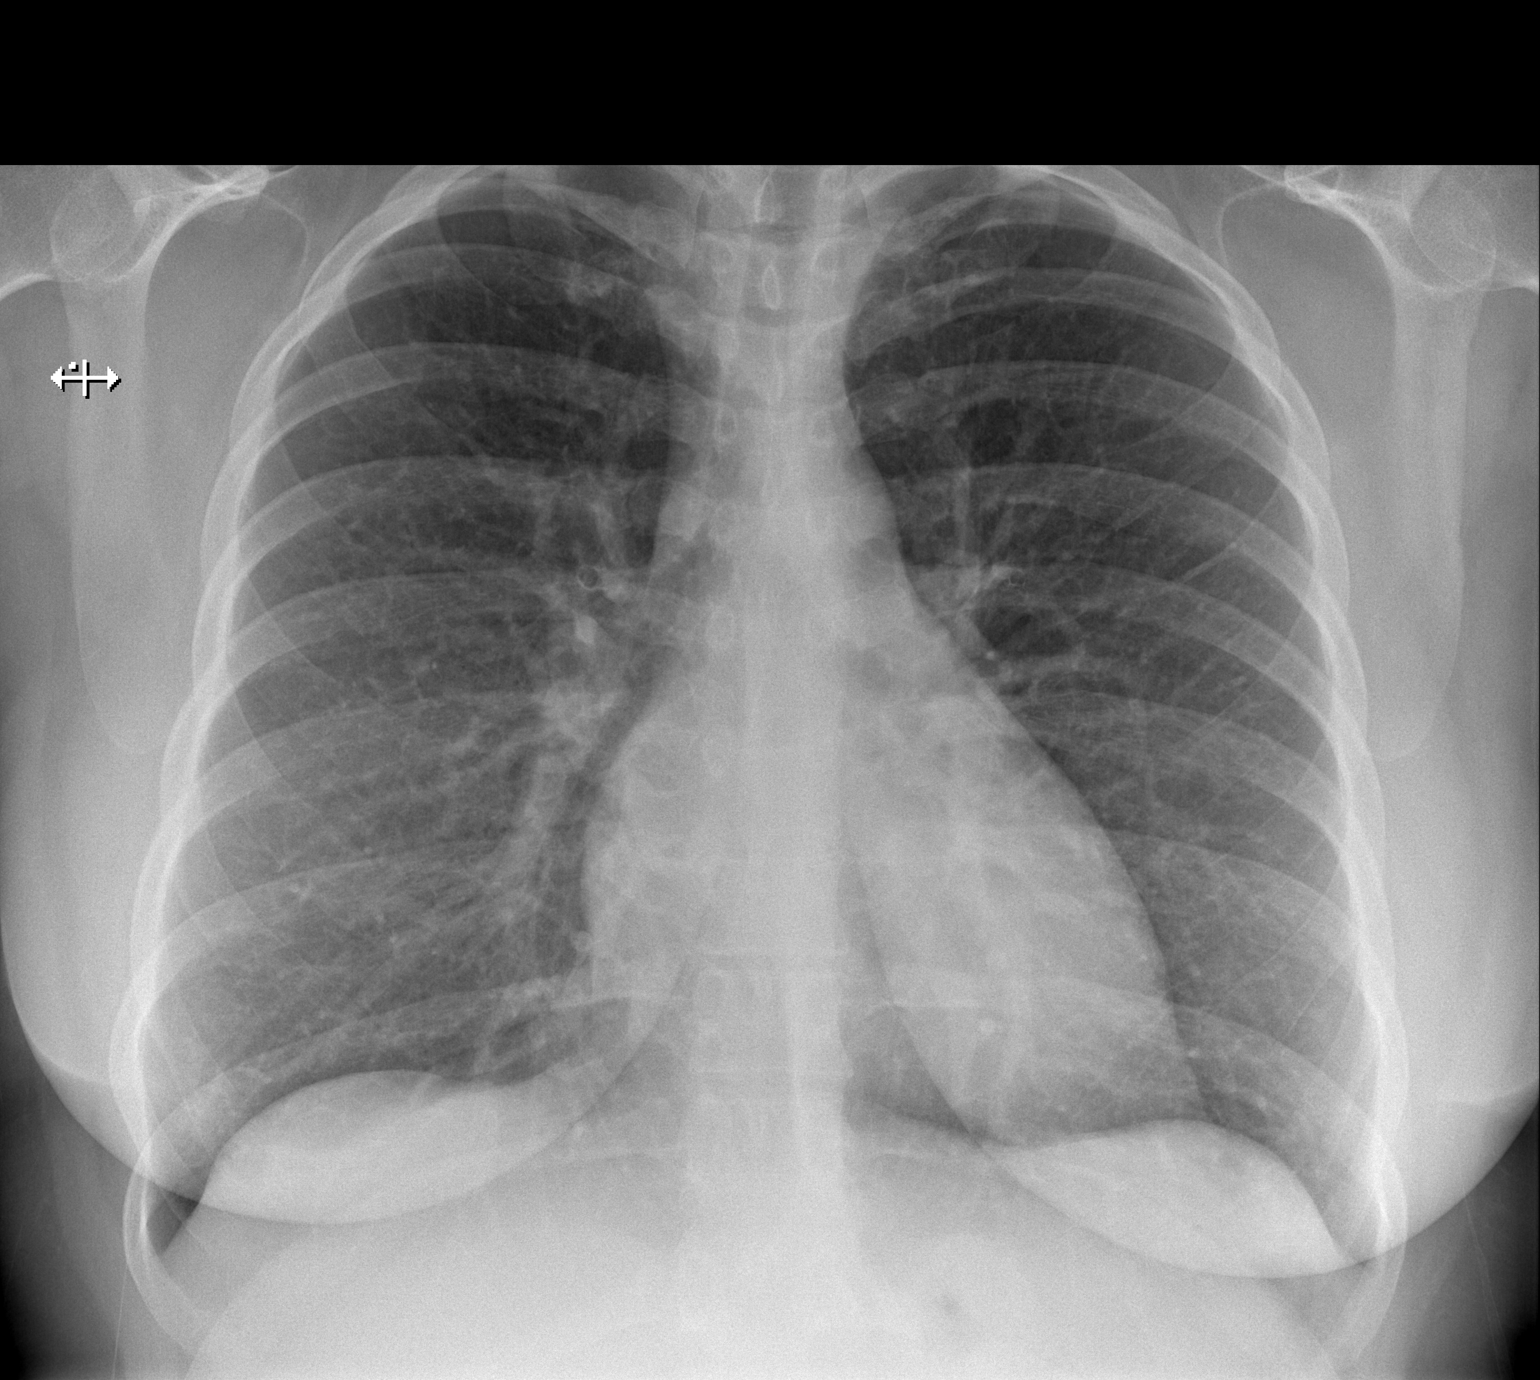

[w chest lat]
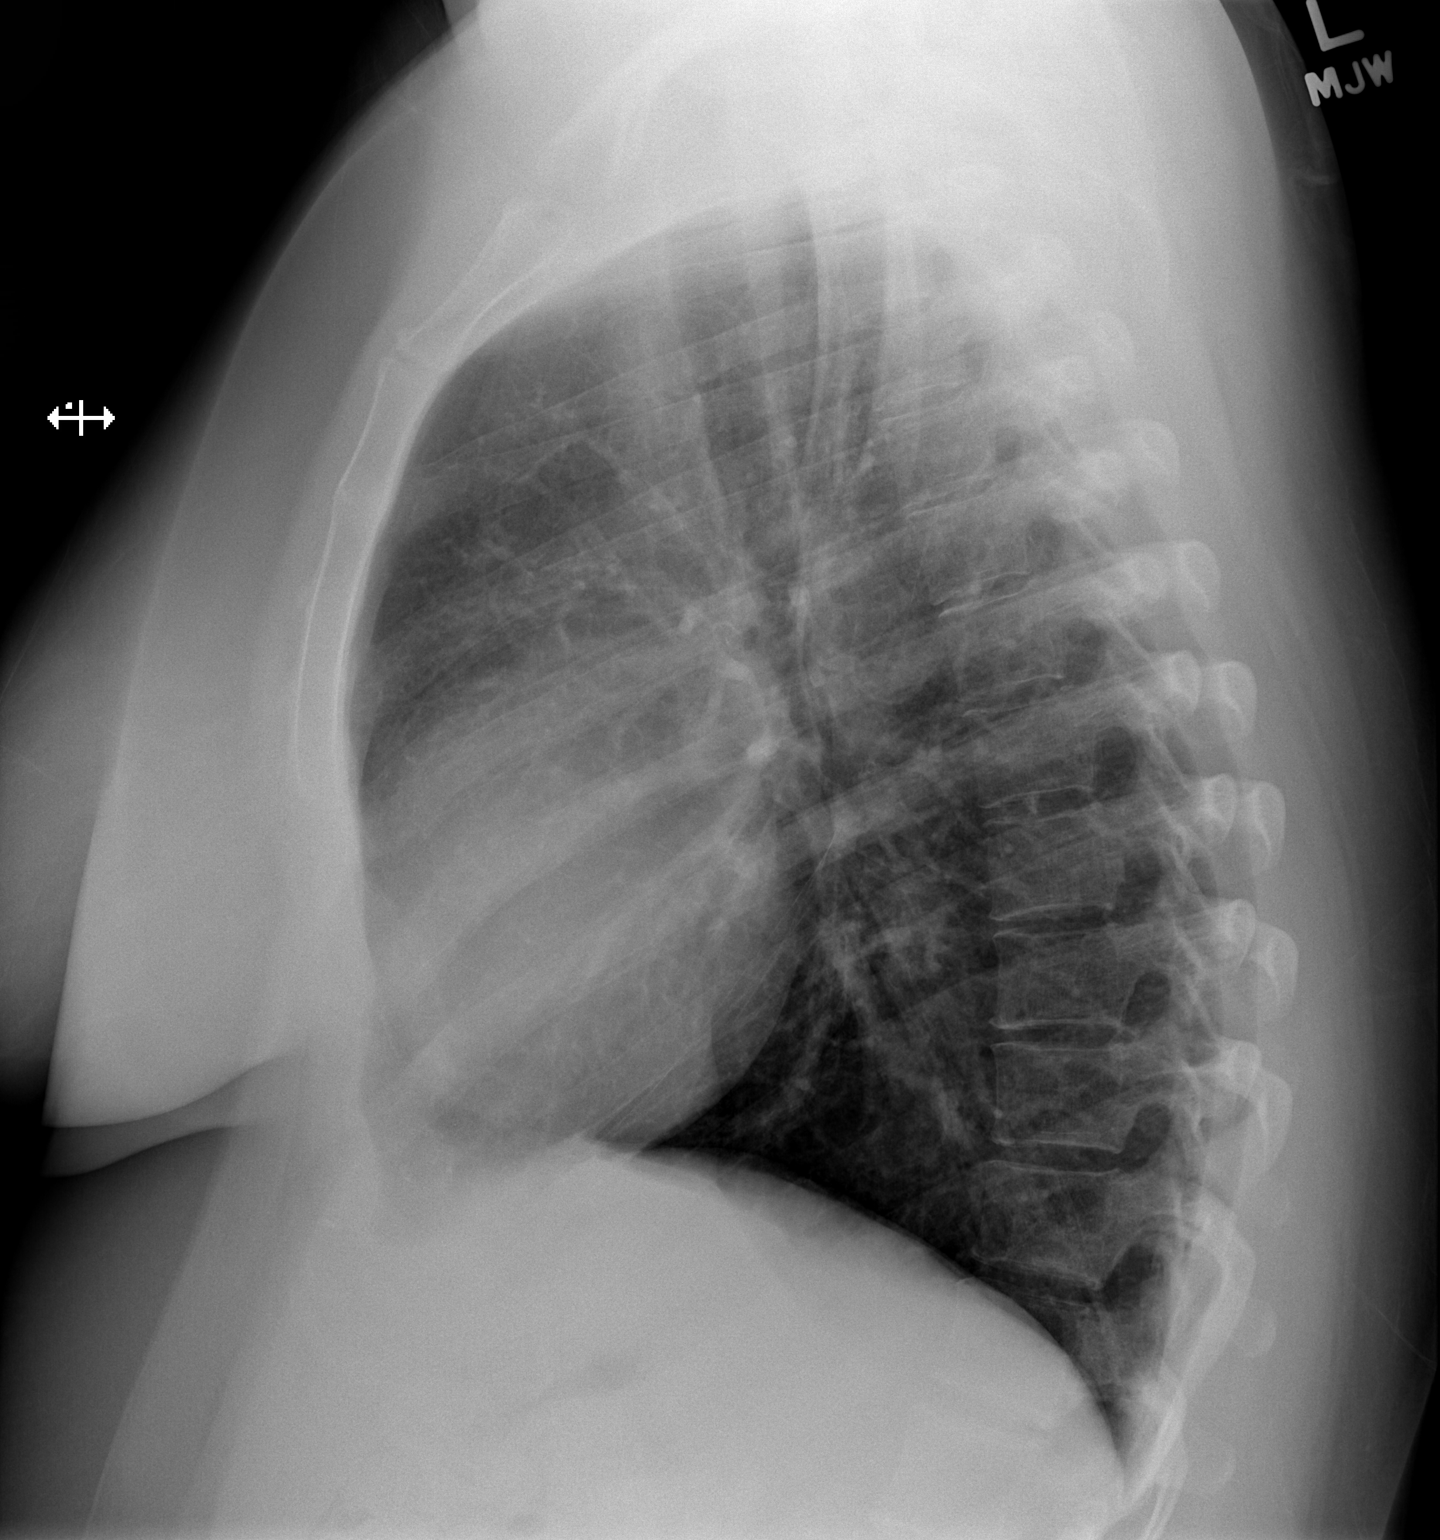

[2 of 2 positions shown; findings below may reference images not displayed]

FINDINGS: Trachea is midline. Heart size normal. Lungs are clear. No pleural
fluid.
IMPRESSION: No active cardiopulmonary disease.

## 2017-01-16 IMAGING — CR DG LUMBAR SPINE 2-3V
1 series · 1 of 1 positions shown · non-contrast
Comparison: 10/06/2014

CLINICAL DATA: L5-S1 laminectomy

EXAM:
LUMBAR SPINE - 2-3 VIEW

[lat]
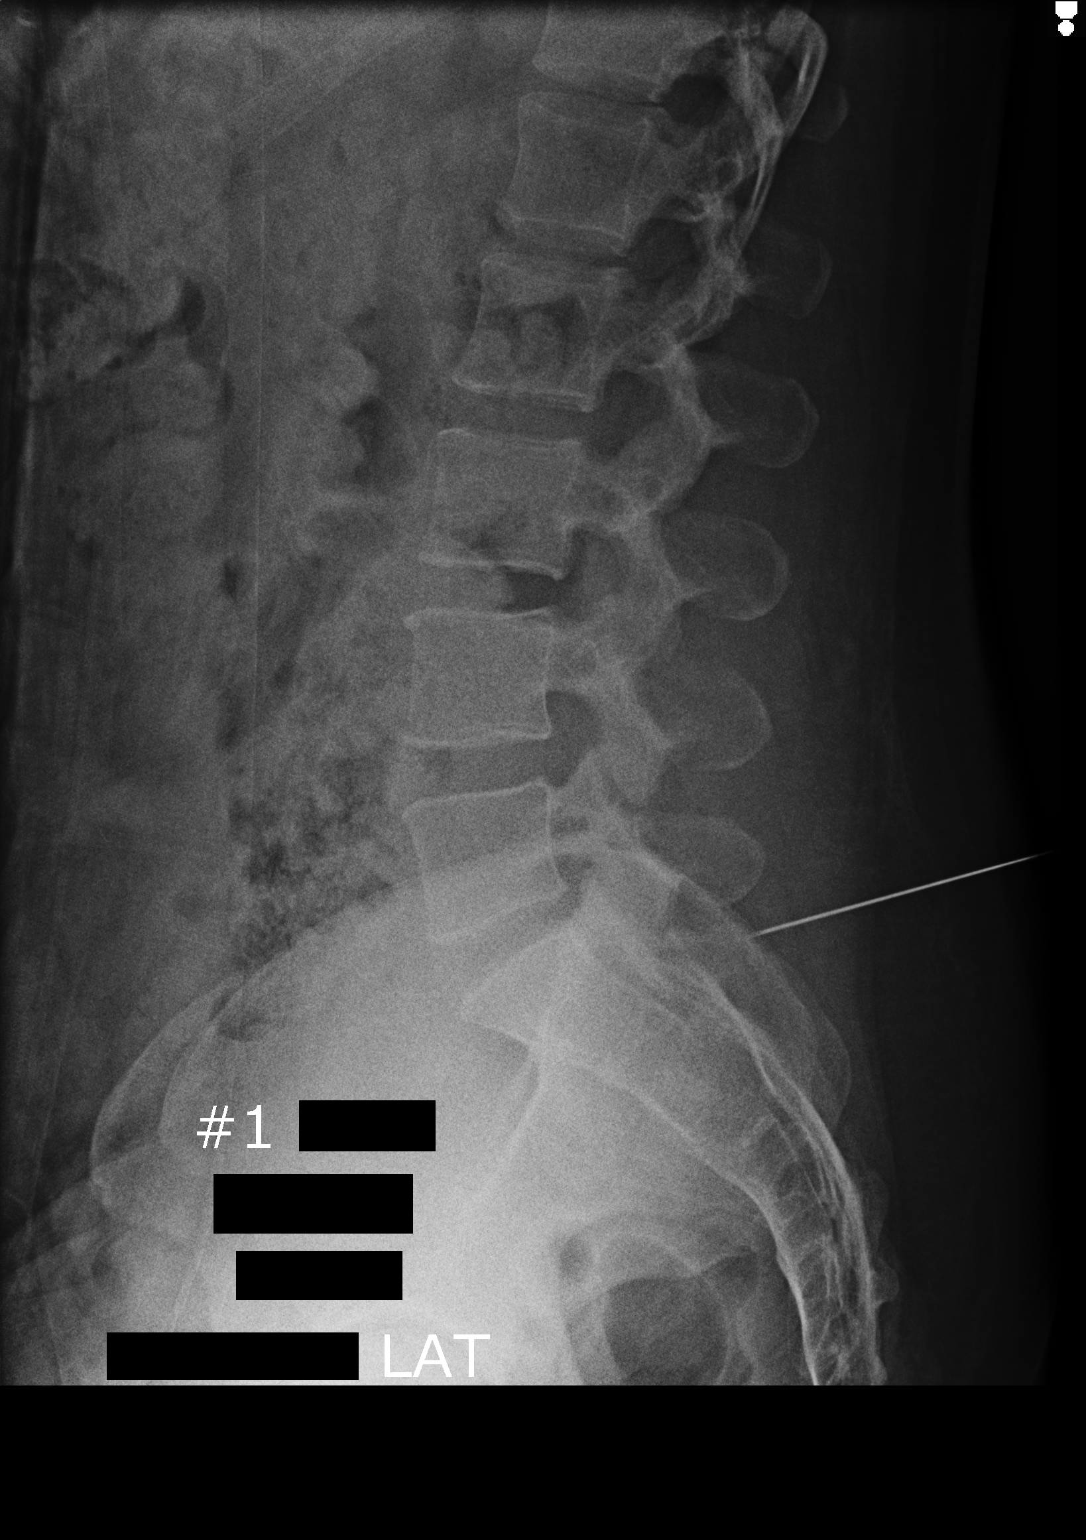

[1 of 1 positions shown; findings below may reference images not displayed]

FINDINGS: Lateral radiograph of the lumbar spine was obtained reveals 5 lumbar
type vertebral bodies. A needle is noted within the posterior soft
tissues at the S1 level just below the L5-S1 interspace. No acute
abnormality is noted.
IMPRESSION: Intraoperative localization as described.

## 2017-03-10 ENCOUNTER — Ambulatory Visit: Payer: Medicaid Other | Admitting: Allergy & Immunology

## 2017-03-30 ENCOUNTER — Other Ambulatory Visit: Payer: Self-pay | Admitting: Allergy and Immunology

## 2017-03-30 DIAGNOSIS — J4541 Moderate persistent asthma with (acute) exacerbation: Secondary | ICD-10-CM

## 2017-08-18 ENCOUNTER — Other Ambulatory Visit: Payer: Self-pay | Admitting: Student

## 2017-08-18 DIAGNOSIS — M5410 Radiculopathy, site unspecified: Secondary | ICD-10-CM

## 2017-08-28 ENCOUNTER — Ambulatory Visit
Admission: RE | Admit: 2017-08-28 | Discharge: 2017-08-28 | Disposition: A | Discharge status: Home / Self Care | Payer: Medicaid Other | Source: Ambulatory Visit | Attending: Student | Admitting: Student

## 2017-08-28 DIAGNOSIS — M5410 Radiculopathy, site unspecified: Secondary | ICD-10-CM

## 2017-08-28 MED ORDER — GADOBENATE DIMEGLUMINE 529 MG/ML IV SOLN
18.0000 mL | Freq: Once | INTRAVENOUS | Status: AC | PRN
  Administered 2017-08-28: 18 mL via INTRAVENOUS

## 2017-09-24 ENCOUNTER — Other Ambulatory Visit: Payer: Self-pay | Admitting: Neurological Surgery

## 2017-09-24 NOTE — Pre-Procedure Instructions (Signed)
[place] Levada SchillingM Wahlquist  09/24/2017      [facility] - [place] - [address] [place] [id number] Phone: [phone] Fax: [phone]  [facility] [place] - [address] [place] [address] Phone: [phone] Fax: [phone]    Your procedure is scheduled on January 31  [facility]30 A.M.  Call this number if you have problems the morning of surgery:  [phone]   Remember:  Do not eat food or drink liquids after midnight.  Continue all medications as directed by your physician except follow these medication instructions before surgery below   Take these medicines the morning of surgery with A SIP OF WATER  ALPRAZolam (XANAX)  cetirizine (ZYRTEC)  PROAIR HFA 108 (90 Base)  If needed venlafaxine XR (EFFEXOR-XR) if you are due to take it that day  7 days prior to surgery STOP taking any Aspirin(unless otherwise instructed by your surgeon), Aleve, Naproxen, Ibuprofen, Motrin, Advil, Goody's, BC's, all herbal medications, fish oil, and all vitamins    Do not wear jewelry, make-up or nail polish.  Do not wear lotions, powders, or perfumes, or deodorant.  Do not shave 48 hours prior to surgery.    Do not bring valuables to the hospital.  [facility] is not responsible for any belongings or valuables.  Contacts, dentures or bridgework may not be worn into surgery.  Leave your suitcase in the car.  After surgery it may be brought to your room.  For patients admitted to the hospital, discharge time will be determined by your treatment team.  Patients discharged the day of surgery will not be allowed to drive home.   Special instructions:   [place]- Preparing For Surgery  Before surgery, you can play an important role. Because skin is not sterile, your skin needs to be as free of germs as possible. You can reduce the number of germs on your skin by washing  with CHG (chlorahexidine gluconate) Soap before surgery.  CHG is an antiseptic cleaner which kills germs and bonds with the skin to continue killing germs even after washing.  Please do not use if you have an allergy to CHG or antibacterial soaps. If your skin becomes reddened/irritated stop using the CHG.  Do not shave (including legs and underarms) for at least 48 hours prior to first CHG shower. It is OK to shave your face.  Please follow these instructions carefully.   1. Shower the NIGHT BEFORE SURGERY and the MORNING OF SURGERY with CHG.   2. If you chose to wash your hair, wash your hair first as usual with your normal shampoo.  3. After you shampoo, rinse your hair and body thoroughly to remove the shampoo.  4. Use CHG as you would any other liquid soap. You can apply CHG directly to the skin and wash gently with a scrungie or a clean washcloth.   5. Apply the CHG Soap to your body ONLY FROM THE NECK DOWN.  Do not use on open wounds or open sores. Avoid contact with your eyes, ears, mouth and genitals (private parts). Wash Face and genitals (private parts)  with your normal soap.  6. Wash thoroughly, paying special attention to the area where your surgery will be performed.  7. Thoroughly rinse your body with warm water from the neck down.  8. DO NOT shower/wash with your normal  soap after using and rinsing off the CHG Soap.  9. Pat yourself dry with a CLEAN TOWEL.  10. Wear CLEAN PAJAMAS to bed the night before surgery, wear comfortable clothes the morning of surgery  11. Place CLEAN SHEETS on your bed the night of your first shower and DO NOT SLEEP WITH PETS.    Day of Surgery: Do not apply any deodorants/lotions. Please wear clean clothes to the hospital/surgery center.      Please read over the following fact sheets that you were given.

## 2017-09-25 ENCOUNTER — Other Ambulatory Visit: Payer: Self-pay

## 2017-09-25 ENCOUNTER — Ambulatory Visit (HOSPITAL_COMMUNITY)
Admission: RE | Admit: 2017-09-25 | Discharge: 2017-09-25 | Disposition: A | Discharge status: Home / Self Care | Payer: Medicaid Other | Source: Ambulatory Visit | Attending: Neurological Surgery | Admitting: Neurological Surgery

## 2017-09-25 ENCOUNTER — Encounter (HOSPITAL_COMMUNITY): Payer: Self-pay

## 2017-09-25 ENCOUNTER — Encounter (HOSPITAL_COMMUNITY)
Admission: RE | Admit: 2017-09-25 | Discharge: 2017-09-25 | Disposition: A | Discharge status: Home / Self Care | Payer: Medicaid Other | Source: Ambulatory Visit | Attending: Neurological Surgery | Admitting: Neurological Surgery

## 2017-09-25 DIAGNOSIS — Z01818 Encounter for other preprocedural examination: Secondary | ICD-10-CM | POA: Diagnosis not present

## 2017-09-25 DIAGNOSIS — Z01810 Encounter for preprocedural cardiovascular examination: Secondary | ICD-10-CM | POA: Diagnosis not present

## 2017-09-25 DIAGNOSIS — M5020 Other cervical disc displacement, unspecified cervical region: Secondary | ICD-10-CM | POA: Insufficient documentation

## 2017-09-25 DIAGNOSIS — Z01812 Encounter for preprocedural laboratory examination: Secondary | ICD-10-CM | POA: Diagnosis present

## 2017-09-25 LAB — BASIC METABOLIC PANEL
Anion gap: 9 (ref 5–15)
BUN: 13 mg/dL (ref 6–20)
CALCIUM: 9.2 mg/dL (ref 8.9–10.3)
CO2: 24 mmol/L (ref 22–32)
Chloride: 109 mmol/L (ref 101–111)
Creatinine, Ser: 0.77 mg/dL (ref 0.44–1.00)
GLUCOSE: 124 mg/dL — AB (ref 65–99)
Potassium: 3.7 mmol/L (ref 3.5–5.1)
SODIUM: 142 mmol/L (ref 135–145)

## 2017-09-25 LAB — PROTIME-INR
INR: 0.92
Prothrombin Time: 12.3 seconds (ref 11.4–15.2)

## 2017-09-25 LAB — CBC WITH DIFFERENTIAL/PLATELET
BASOS ABS: 0 10*3/uL (ref 0.0–0.1)
BASOS PCT: 0 %
EOS PCT: 1 %
Eosinophils Absolute: 0.1 10*3/uL (ref 0.0–0.7)
HEMATOCRIT: 40.7 % (ref 36.0–46.0)
Hemoglobin: 13.4 g/dL (ref 12.0–15.0)
Lymphocytes Relative: 19 %
Lymphs Abs: 2.3 10*3/uL (ref 0.7–4.0)
MCH: 32.1 pg (ref 26.0–34.0)
MCHC: 32.9 g/dL (ref 30.0–36.0)
MCV: 97.4 fL (ref 78.0–100.0)
MONO ABS: 0.7 10*3/uL (ref 0.1–1.0)
Monocytes Relative: 6 %
NEUTROS ABS: 9 10*3/uL — AB (ref 1.7–7.7)
Neutrophils Relative %: 74 %
PLATELETS: 294 10*3/uL (ref 150–400)
RBC: 4.18 MIL/uL (ref 3.87–5.11)
RDW: 13.5 % (ref 11.5–15.5)
WBC: 12.1 10*3/uL — AB (ref 4.0–10.5)

## 2017-09-25 LAB — SURGICAL PCR SCREEN
MRSA, PCR: NEGATIVE
STAPHYLOCOCCUS AUREUS: NEGATIVE

## 2017-09-25 NOTE — Progress Notes (Signed)
[facility] Cardiologist - denies  Chest x-ray - 09/25/17 EKG - 09/25/17 Stress Test - denies ECHO - denies Cardiac Cath - denies   Anesthesia review: NO  Patient denies shortness of breath, fever, cough and chest pain at PAT appointment   Patient verbalized understanding of instructions that were given to them at the PAT appointment. Patient was also instructed that they will need to review over the PAT instructions again at home before surgery.

## 2017-09-29 ENCOUNTER — Telehealth: Payer: Self-pay

## 2017-09-29 ENCOUNTER — Ambulatory Visit: Payer: Medicaid Other | Admitting: Physician Assistant

## 2017-09-29 ENCOUNTER — Encounter: Payer: Self-pay | Admitting: Physician Assistant

## 2017-09-29 VITALS — BP 134/79 | HR 82 | Temp 98.9°F | Ht 63.0 in | Wt 197.2 lb

## 2017-09-29 DIAGNOSIS — J4541 Moderate persistent asthma with (acute) exacerbation: Secondary | ICD-10-CM | POA: Diagnosis not present

## 2017-09-29 DIAGNOSIS — H1013 Acute atopic conjunctivitis, bilateral: Secondary | ICD-10-CM | POA: Diagnosis not present

## 2017-09-29 DIAGNOSIS — J3089 Other allergic rhinitis: Secondary | ICD-10-CM

## 2017-09-29 DIAGNOSIS — F39 Unspecified mood [affective] disorder: Secondary | ICD-10-CM

## 2017-09-29 MED ORDER — ALBUTEROL SULFATE HFA 108 (90 BASE) MCG/ACT IN AERS: INHALATION_SPRAY | RESPIRATORY_TRACT | 2 refills | Status: DC

## 2017-09-29 MED ORDER — DULOXETINE HCL 30 MG PO CPEP: 30.0000 mg | ORAL_CAPSULE | Freq: Every day | ORAL | 3 refills | Status: DC

## 2017-09-29 MED ORDER — ALPRAZOLAM 1 MG PO TABS: 1.0000 mg | ORAL_TABLET | Freq: Four times a day (QID) | ORAL | 1 refills | Status: DC | PRN

## 2017-09-29 MED ORDER — CETIRIZINE HCL 10 MG PO TABS: 10.0000 mg | ORAL_TABLET | Freq: Every day | ORAL | 11 refills | Status: DC

## 2017-09-29 MED ORDER — OLOPATADINE HCL 0.1 % OP SOLN: 1.0000 [drp] | Freq: Two times a day (BID) | OPHTHALMIC | 5 refills | Status: DC

## 2017-09-29 NOTE — Telephone Encounter (Signed)
Medicai non preferred olopatadine  Preferred are cromolyn sodium drop  Pataday drops and Pazeo drops

## 2017-09-29 NOTE — Patient Instructions (Signed)
In a few days you may receive a survey in the mail or online from [employer] regarding your visit with us today. Please take a moment to fill this out. Your feedback is very important to our whole office. It can help us better understand your needs as well as improve your experience and satisfaction. Thank you for taking your time to complete it. We care about you.  Marisella Puccio, PA-C  

## 2017-09-30 MED ORDER — OLOPATADINE HCL 0.2 % OP SOLN: 1.0000 [drp] | Freq: Two times a day (BID) | OPHTHALMIC | 11 refills | Status: DC

## 2017-09-30 NOTE — Addendum Note (Signed)
Addended by: Remus LofflerJONES, Jennice Renegar S on: 09/30/2017 11:03 AM   Modules accepted: Orders

## 2017-09-30 NOTE — Progress Notes (Signed)
BP 134/79   Pulse 82   Temp 98.9 F (37.2 C) (Oral)   Ht 5\' 3"  (1.6 m)   Wt 197 lb 3.2 oz (89.4 kg)   BMI 34.93 kg/m    Subjective:    Patient ID: Alice Santiago, female    DOB: Jul 25, 1981, 37 y.o.   MRN: [id number]  HPI: Alice Santiago is a 37 y.o. female presenting on 09/29/2017 for New Patient (Initial Visit) and Establish Care  Patient comes in as a new patient to establish.  She has been a patient of Dr. Margo Common.  He is retiring.  Her medical history is positive for degenerative disc disease with surgeries, mood disorder.  All of her medications are reviewed today.  States that she has taken Effexor for some time she does have a side effect we will plan to change her medication today.  We are going to start duloxetine.  She is planning to have surgery in the next couple of weeks.  We will recheck her in 6 weeks after her surgery.  Relevant past medical, surgical, family and social history reviewed and updated as indicated. Allergies and medications reviewed and updated.  Past Medical History:  Diagnosis Date  . Anxiety   . Asthma   . Back problem   . Breast tenderness in female 04/23/2015  . Bronchitis    currently being treated for bronchititis,no fever  . Bronchitis    hx  . BV (bacterial vaginosis) 03/14/2015  . Chlamydia    history of   . Depression   . History of chlamydia 04/23/2015  . Vaginal discharge 03/14/2015  . Vaginal Pap smear, abnormal     Past Surgical History:  Procedure Laterality Date  . ABDOMINAL HYSTERECTOMY    . CESAREAN SECTION     times 2  . LUMBAR LAMINECTOMY/DECOMPRESSION MICRODISCECTOMY Left 11/10/2014   Procedure: LUMBAR LAMINECTOMY/DECOMPRESSION MICRODISCECTOMY LEFT LUMBAR FIVE-SACRAL ONE;  Surgeon: Tia Alert, MD;  Location: [facility];  Service: Neurosurgery;  Laterality: Left;  left  . TONSILLECTOMY    . TUBAL LIGATION      Review of Systems  Constitutional: Negative.  Negative for activity change, fatigue and fever.  HENT:  Negative.   Eyes: Negative.   Respiratory: Negative.  Negative for cough.   Cardiovascular: Negative.  Negative for chest pain.  Gastrointestinal: Negative.  Negative for abdominal pain.  Endocrine: Negative.   Genitourinary: Negative.  Negative for dysuria.  Musculoskeletal: Negative.   Skin: Negative.   Neurological: Negative.     Allergies as of 09/29/2017   No Known Allergies     Medication List        Accurate as of 09/29/17 11:59 PM. Always use your most recent med list.          albuterol 108 (90 Base) MCG/ACT inhaler Commonly known as:  PROAIR HFA USE 2 PUFFS EVERY 6 HOURS AS NEEDED   ALPRAZolam 1 MG tablet Commonly known as:  XANAX Take 1 tablet (1 mg total) by mouth 4 (four) times daily as needed for anxiety.   cetirizine 10 MG tablet Commonly known as:  ZYRTEC Take 1 tablet (10 mg total) by mouth daily.   DULoxetine 30 MG capsule Commonly known as:  CYMBALTA Take 1 capsule (30 mg total) by mouth daily.   EPINEPHrine 0.3 mg/0.3 mL Soaj injection Commonly known as:  EPI-PEN Inject 0.3 mg into the muscle once.   ibuprofen 800 MG tablet Commonly known as:  ADVIL,MOTRIN Take 800 mg by mouth  every 8 (eight) hours as needed for headache or moderate pain.   Olopatadine HCl 0.2 % Soln Commonly known as:  PATADAY Apply 1 drop to eye 3 (three) times daily - between meals and at bedtime.          Objective:    BP 134/79   Pulse 82   Temp 98.9 F (37.2 C) (Oral)   Ht 5\' 3"  (1.6 m)   Wt 197 lb 3.2 oz (89.4 kg)   BMI 34.93 kg/m   No Known Allergies  Physical Exam  Constitutional: She is oriented to person, place, and time. She appears well-developed and well-nourished.  HENT:  Head: Normocephalic and atraumatic.  Right Ear: Tympanic membrane, external ear and ear canal normal.  Left Ear: Tympanic membrane, external ear and ear canal normal.  Nose: Nose normal. No rhinorrhea.  Mouth/Throat: Oropharynx is clear and moist and mucous membranes are  normal. No oropharyngeal exudate or posterior oropharyngeal erythema.  Eyes: Conjunctivae and EOM are normal. Pupils are equal, round, and reactive to light.  Neck: Normal range of motion. Neck supple.  Cardiovascular: Normal rate, regular rhythm, normal heart sounds and intact distal pulses.  Pulmonary/Chest: Effort normal and breath sounds normal.  Abdominal: Soft. Bowel sounds are normal.  Neurological: She is alert and oriented to person, place, and time. She has normal reflexes.  Skin: Skin is warm and dry. No rash noted.  Psychiatric: She has a normal mood and affect. Her behavior is normal. Judgment and thought content normal.  Nursing note and vitals reviewed.   Results for orders placed or performed during the hospital encounter of 09/25/17  Surgical pcr screen  Result Value Ref Range   MRSA, PCR NEGATIVE NEGATIVE   Staphylococcus aureus NEGATIVE NEGATIVE  Basic metabolic panel  Result Value Ref Range   Sodium 142 135 - 145 mmol/L   Potassium 3.7 3.5 - 5.1 mmol/L   Chloride 109 101 - 111 mmol/L   CO2 24 22 - 32 mmol/L   Glucose, Bld 124 (H) 65 - 99 mg/dL   BUN 13 6 - 20 mg/dL   Creatinine, Ser 4.090.77 0.44 - 1.00 mg/dL   Calcium 9.2 8.9 - 81.110.3 mg/dL   GFR calc non Af Amer >60 >60 mL/min   GFR calc Af Amer >60 >60 mL/min   Anion gap 9 5 - 15  CBC WITH DIFFERENTIAL  Result Value Ref Range   WBC 12.1 (H) 4.0 - 10.5 K/uL   RBC 4.18 3.87 - 5.11 MIL/uL   Hemoglobin 13.4 12.0 - 15.0 g/dL   HCT 91.440.7 78.236.0 - 95.646.0 %   MCV 97.4 78.0 - 100.0 fL   MCH 32.1 26.0 - 34.0 pg   MCHC 32.9 30.0 - 36.0 g/dL   RDW 21.313.5 08.611.5 - 57.815.5 %   Platelets 294 150 - 400 K/uL   Neutrophils Relative % 74 %   Neutro Abs 9.0 (H) 1.7 - 7.7 K/uL   Lymphocytes Relative 19 %   Lymphs Abs 2.3 0.7 - 4.0 K/uL   Monocytes Relative 6 %   Monocytes Absolute 0.7 0.1 - 1.0 K/uL   Eosinophils Relative 1 %   Eosinophils Absolute 0.1 0.0 - 0.7 K/uL   Basophils Relative 0 %   Basophils Absolute 0.0 0.0 - 0.1 K/uL    Protime-INR  Result Value Ref Range   Prothrombin Time 12.3 11.4 - 15.2 seconds   INR 0.92       Assessment & Plan:   1. Moderate persistent asthma with  acute exacerbation - albuterol (PROAIR HFA) 108 (90 Base) MCG/ACT inhaler; USE 2 PUFFS EVERY 6 HOURS AS NEEDED  Dispense: 18 g; Refill: 2  2. Allergic conjunctivitis of both eyes  3. Perennial and seasonal allergic rhinitis - cetirizine (ZYRTEC) 10 MG tablet; Take 1 tablet (10 mg total) by mouth daily.  Dispense: 30 tablet; Refill: 11  4. Mood disorder (HCC) - DULoxetine (CYMBALTA) 30 MG capsule; Take 1 capsule (30 mg total) by mouth daily.  Dispense: 30 capsule; Refill: 3 - ALPRAZolam (XANAX) 1 MG tablet; Take 1 tablet (1 mg total) by mouth 4 (four) times daily as needed for anxiety.  Dispense: 120 tablet; Refill: 1    Current Outpatient Medications:  .  albuterol (PROAIR HFA) 108 (90 Base) MCG/ACT inhaler, USE 2 PUFFS EVERY 6 HOURS AS NEEDED, Disp: 18 g, Rfl: 2 .  ALPRAZolam (XANAX) 1 MG tablet, Take 1 tablet (1 mg total) by mouth 4 (four) times daily as needed for anxiety., Disp: 120 tablet, Rfl: 1 .  cetirizine (ZYRTEC) 10 MG tablet, Take 1 tablet (10 mg total) by mouth daily., Disp: 30 tablet, Rfl: 11 .  EPINEPHrine 0.3 mg/0.3 mL IJ SOAJ injection, Inject 0.3 mg into the muscle once., Disp: , Rfl:  .  ibuprofen (ADVIL,MOTRIN) 800 MG tablet, Take 800 mg by mouth every 8 (eight) hours as needed for headache or moderate pain., Disp: , Rfl:  .  DULoxetine (CYMBALTA) 30 MG capsule, Take 1 capsule (30 mg total) by mouth daily., Disp: 30 capsule, Rfl: 3 .  Olopatadine HCl (PATADAY) 0.2 % SOLN, Apply 1 drop to eye 3 (three) times daily - between meals and at bedtime., Disp: 2.5 mL, Rfl: 11 Continue all other maintenance medications as listed above.  Follow up plan: Return in about 6 weeks (around 11/10/2017).  Educational handout given for survey  Remus Loffler PA-C [facility] [address]   [place], [place] [address] [phone]   09/30/2017, 1:41 PM

## 2017-10-02 ENCOUNTER — Telehealth: Payer: Self-pay

## 2017-10-02 NOTE — Telephone Encounter (Signed)
[facility] left message 

## 2017-10-02 NOTE — Telephone Encounter (Signed)
[facility]   Writer left a voice mail message.  

## 2017-10-07 NOTE — Anesthesia Preprocedure Evaluation (Signed)
Anesthesia Evaluation  Patient identified by MRN, date of birth, ID band Patient awake    Reviewed: Allergy & Precautions, H&P , Patient's Chart, lab work & pertinent test results, reviewed documented beta blocker date and time   Airway Mallampati: II  TM Distance: >3 FB Neck ROM: full    Dental no notable dental hx.    Pulmonary Current Smoker,    Pulmonary exam normal breath sounds clear to auscultation       Cardiovascular  Rhythm:regular Rate:Normal     Neuro/Psych    GI/Hepatic   Endo/Other    Renal/GU      Musculoskeletal   Abdominal   Peds  Hematology   Anesthesia Other Findings   Reproductive/Obstetrics                             Anesthesia Physical Anesthesia Plan  ASA: II  Anesthesia Plan: General   Post-op Pain Management:    Induction: Intravenous  PONV Risk Score and Plan: 3 and Scopolamine patch - Pre-op, Dexamethasone, Ondansetron and Treatment may vary due to age or medical condition  Airway Management Planned: Oral ETT  Additional Equipment:   Intra-op Plan:   Post-operative Plan: Extubation in OR  Informed Consent: I have reviewed the patients History and Physical, chart, labs and discussed the procedure including the risks, benefits and alternatives for the proposed anesthesia with the patient or authorized representative who has indicated his/her understanding and acceptance.   Dental Advisory Given  Plan Discussed with: CRNA and Surgeon  Anesthesia Plan Comments: (  )        Anesthesia Quick Evaluation

## 2017-10-08 ENCOUNTER — Ambulatory Visit (HOSPITAL_COMMUNITY): Payer: Medicaid Other | Admitting: Anesthesiology

## 2017-10-08 ENCOUNTER — Encounter (HOSPITAL_COMMUNITY): Payer: Self-pay | Admitting: *Deleted

## 2017-10-08 ENCOUNTER — Ambulatory Visit (HOSPITAL_COMMUNITY)
Admission: RE | Disposition: A | Discharge status: Home / Self Care | Payer: Self-pay | Source: Ambulatory Visit | Attending: Neurological Surgery

## 2017-10-08 ENCOUNTER — Ambulatory Visit (HOSPITAL_COMMUNITY): Payer: Medicaid Other

## 2017-10-08 ENCOUNTER — Observation Stay (HOSPITAL_COMMUNITY)
Admission: RE | Admit: 2017-10-08 | Discharge: 2017-10-08 | Disposition: A | Discharge status: Home / Self Care | Payer: Medicaid Other | Source: Ambulatory Visit | Attending: Neurological Surgery | Admitting: Neurological Surgery

## 2017-10-08 DIAGNOSIS — F17210 Nicotine dependence, cigarettes, uncomplicated: Secondary | ICD-10-CM | POA: Insufficient documentation

## 2017-10-08 DIAGNOSIS — Z79899 Other long term (current) drug therapy: Secondary | ICD-10-CM | POA: Diagnosis not present

## 2017-10-08 DIAGNOSIS — M4802 Spinal stenosis, cervical region: Secondary | ICD-10-CM | POA: Diagnosis not present

## 2017-10-08 DIAGNOSIS — M50122 Cervical disc disorder at C5-C6 level with radiculopathy: Secondary | ICD-10-CM | POA: Diagnosis present

## 2017-10-08 DIAGNOSIS — J45909 Unspecified asthma, uncomplicated: Secondary | ICD-10-CM | POA: Insufficient documentation

## 2017-10-08 DIAGNOSIS — F329 Major depressive disorder, single episode, unspecified: Secondary | ICD-10-CM | POA: Insufficient documentation

## 2017-10-08 DIAGNOSIS — Z419 Encounter for procedure for purposes other than remedying health state, unspecified: Secondary | ICD-10-CM

## 2017-10-08 DIAGNOSIS — M4322 Fusion of spine, cervical region: Secondary | ICD-10-CM | POA: Diagnosis present

## 2017-10-08 HISTORY — PX: ANTERIOR CERVICAL DECOMP/DISCECTOMY FUSION: SHX1161

## 2017-10-08 SURGERY — ANTERIOR CERVICAL DECOMPRESSION/DISCECTOMY FUSION 1 LEVEL
Anesthesia: General | Site: Spine Cervical

## 2017-10-08 MED ORDER — HYDROMORPHONE HCL 1 MG/ML IJ SOLN
INTRAMUSCULAR | Status: AC
  Administered 2017-10-08: 0.5 mg via INTRAVENOUS
  Filled 2017-10-08: qty 1

## 2017-10-08 MED ORDER — PHENYLEPHRINE 40 MCG/ML (10ML) SYRINGE FOR IV PUSH (FOR BLOOD PRESSURE SUPPORT)
PREFILLED_SYRINGE | INTRAVENOUS | Status: AC
  Filled 2017-10-08: qty 10

## 2017-10-08 MED ORDER — ONDANSETRON HCL 4 MG/2ML IJ SOLN
4.0000 mg | Freq: Four times a day (QID) | INTRAMUSCULAR | Status: DC | PRN
  Administered 2017-10-08: 4 mg via INTRAVENOUS
  Filled 2017-10-08: qty 2

## 2017-10-08 MED ORDER — ONDANSETRON HCL 4 MG PO TABS
4.0000 mg | ORAL_TABLET | Freq: Four times a day (QID) | ORAL | Status: DC | PRN
  Filled 2017-10-08: qty 1

## 2017-10-08 MED ORDER — LIDOCAINE HCL (CARDIAC) 20 MG/ML IV SOLN
INTRAVENOUS | Status: DC | PRN
  Administered 2017-10-08: 100 mg via INTRAVENOUS

## 2017-10-08 MED ORDER — ROCURONIUM BROMIDE 10 MG/ML (PF) SYRINGE
PREFILLED_SYRINGE | INTRAVENOUS | Status: AC
  Filled 2017-10-08: qty 5

## 2017-10-08 MED ORDER — ONDANSETRON HCL 4 MG/2ML IJ SOLN
INTRAMUSCULAR | Status: DC | PRN
  Administered 2017-10-08: 4 mg via INTRAVENOUS

## 2017-10-08 MED ORDER — SODIUM CHLORIDE 0.9 % IV SOLN: 250.0000 mL | INTRAVENOUS | Status: DC

## 2017-10-08 MED ORDER — MORPHINE SULFATE (PF) 4 MG/ML IV SOLN: 2.0000 mg | INTRAVENOUS | Status: DC | PRN

## 2017-10-08 MED ORDER — FENTANYL CITRATE (PF) 250 MCG/5ML IJ SOLN
INTRAMUSCULAR | Status: AC
  Filled 2017-10-08: qty 5

## 2017-10-08 MED ORDER — ACETAMINOPHEN 325 MG PO TABS: 650.0000 mg | ORAL_TABLET | ORAL | Status: DC | PRN

## 2017-10-08 MED ORDER — ALBUTEROL SULFATE (2.5 MG/3ML) 0.083% IN NEBU
INHALATION_SOLUTION | RESPIRATORY_TRACT | Status: AC
  Administered 2017-10-08: 2.5 mg
  Filled 2017-10-08: qty 3

## 2017-10-08 MED ORDER — SENNA 8.6 MG PO TABS
1.0000 | ORAL_TABLET | Freq: Two times a day (BID) | ORAL | Status: DC
  Administered 2017-10-08: 8.6 mg via ORAL
  Filled 2017-10-08: qty 1

## 2017-10-08 MED ORDER — ONDANSETRON HCL 4 MG/2ML IJ SOLN
INTRAMUSCULAR | Status: AC
  Filled 2017-10-08: qty 2

## 2017-10-08 MED ORDER — THROMBIN 5000 UNITS EX SOLR
CUTANEOUS | Status: DC | PRN
  Administered 2017-10-08: 5000 [IU] via TOPICAL

## 2017-10-08 MED ORDER — SUGAMMADEX SODIUM 200 MG/2ML IV SOLN
INTRAVENOUS | Status: DC | PRN
  Administered 2017-10-08: 178.8 mg via INTRAVENOUS

## 2017-10-08 MED ORDER — HYDROMORPHONE HCL 1 MG/ML IJ SOLN
0.2500 mg | INTRAMUSCULAR | Status: DC | PRN
  Administered 2017-10-08 (×2): 0.5 mg via INTRAVENOUS

## 2017-10-08 MED ORDER — CHLORHEXIDINE GLUCONATE CLOTH 2 % EX PADS: 6.0000 | MEDICATED_PAD | Freq: Once | CUTANEOUS | Status: DC

## 2017-10-08 MED ORDER — METHOCARBAMOL 500 MG PO TABS: 500.0000 mg | ORAL_TABLET | Freq: Four times a day (QID) | ORAL | 1 refills | Status: DC | PRN

## 2017-10-08 MED ORDER — HYDROCODONE-ACETAMINOPHEN 5-325 MG PO TABS: 1.0000 | ORAL_TABLET | ORAL | 0 refills | Status: DC | PRN

## 2017-10-08 MED ORDER — DEXAMETHASONE SODIUM PHOSPHATE 10 MG/ML IJ SOLN
10.0000 mg | INTRAMUSCULAR | Status: AC
  Administered 2017-10-08: 10 mg via INTRAVENOUS
  Filled 2017-10-08: qty 1

## 2017-10-08 MED ORDER — ALBUTEROL SULFATE (2.5 MG/3ML) 0.083% IN NEBU: 2.5000 mg | INHALATION_SOLUTION | Freq: Four times a day (QID) | RESPIRATORY_TRACT | Status: DC | PRN

## 2017-10-08 MED ORDER — DEXAMETHASONE SODIUM PHOSPHATE 10 MG/ML IJ SOLN
INTRAMUSCULAR | Status: AC
  Filled 2017-10-08: qty 1

## 2017-10-08 MED ORDER — MIDAZOLAM HCL 5 MG/5ML IJ SOLN
INTRAMUSCULAR | Status: DC | PRN
  Administered 2017-10-08: 2 mg via INTRAVENOUS

## 2017-10-08 MED ORDER — SUGAMMADEX SODIUM 200 MG/2ML IV SOLN
INTRAVENOUS | Status: AC
  Filled 2017-10-08: qty 2

## 2017-10-08 MED ORDER — CEFAZOLIN SODIUM-DEXTROSE 2-4 GM/100ML-% IV SOLN
2.0000 g | INTRAVENOUS | Status: AC
  Administered 2017-10-08: 2 g via INTRAVENOUS

## 2017-10-08 MED ORDER — POTASSIUM CHLORIDE IN NACL 20-0.9 MEQ/L-% IV SOLN: INTRAVENOUS | Status: DC

## 2017-10-08 MED ORDER — BUPIVACAINE HCL (PF) 0.25 % IJ SOLN
INTRAMUSCULAR | Status: AC
  Filled 2017-10-08: qty 30

## 2017-10-08 MED ORDER — BUPIVACAINE HCL (PF) 0.25 % IJ SOLN
INTRAMUSCULAR | Status: DC | PRN
  Administered 2017-10-08: 7 mL

## 2017-10-08 MED ORDER — ALBUTEROL SULFATE (2.5 MG/3ML) 0.083% IN NEBU
2.5000 mg | INHALATION_SOLUTION | Freq: Once | RESPIRATORY_TRACT | Status: AC
  Administered 2017-10-08: 2.5 mg via RESPIRATORY_TRACT
  Filled 2017-10-08: qty 3

## 2017-10-08 MED ORDER — HEMOSTATIC AGENTS (NO CHARGE) OPTIME
TOPICAL | Status: DC | PRN
  Administered 2017-10-08: 1 via TOPICAL

## 2017-10-08 MED ORDER — PROPOFOL 10 MG/ML IV BOLUS
INTRAVENOUS | Status: AC
  Filled 2017-10-08: qty 40

## 2017-10-08 MED ORDER — SUCCINYLCHOLINE CHLORIDE 200 MG/10ML IV SOSY
PREFILLED_SYRINGE | INTRAVENOUS | Status: AC
  Filled 2017-10-08: qty 10

## 2017-10-08 MED ORDER — CEFAZOLIN SODIUM-DEXTROSE 2-4 GM/100ML-% IV SOLN
INTRAVENOUS | Status: AC
  Filled 2017-10-08: qty 100

## 2017-10-08 MED ORDER — THROMBIN (RECOMBINANT) 5000 UNITS EX SOLR
OROMUCOSAL | Status: DC | PRN
  Administered 2017-10-08: 5 mL via TOPICAL

## 2017-10-08 MED ORDER — SODIUM CHLORIDE 0.9% FLUSH: 3.0000 mL | INTRAVENOUS | Status: DC | PRN

## 2017-10-08 MED ORDER — METHOCARBAMOL 500 MG PO TABS
500.0000 mg | ORAL_TABLET | Freq: Four times a day (QID) | ORAL | Status: DC | PRN
Start: 2017-10-08 — End: 2017-10-08
  Administered 2017-10-08: 500 mg via ORAL
  Filled 2017-10-08: qty 1

## 2017-10-08 MED ORDER — SODIUM CHLORIDE 0.9% FLUSH: 3.0000 mL | Freq: Two times a day (BID) | INTRAVENOUS | Status: DC

## 2017-10-08 MED ORDER — METHOCARBAMOL 1000 MG/10ML IJ SOLN
500.0000 mg | Freq: Four times a day (QID) | INTRAVENOUS | Status: DC | PRN
  Filled 2017-10-08: qty 5

## 2017-10-08 MED ORDER — 0.9 % SODIUM CHLORIDE (POUR BTL) OPTIME
TOPICAL | Status: DC | PRN
  Administered 2017-10-08: 1000 mL

## 2017-10-08 MED ORDER — FENTANYL CITRATE (PF) 100 MCG/2ML IJ SOLN
INTRAMUSCULAR | Status: DC | PRN
  Administered 2017-10-08 (×2): 50 ug via INTRAVENOUS
  Administered 2017-10-08: 150 ug via INTRAVENOUS

## 2017-10-08 MED ORDER — PHENOL 1.4 % MT LIQD
1.0000 | OROMUCOSAL | Status: DC | PRN
  Administered 2017-10-08: 1 via OROMUCOSAL
  Filled 2017-10-08: qty 177

## 2017-10-08 MED ORDER — DULOXETINE HCL 30 MG PO CPEP
30.0000 mg | ORAL_CAPSULE | Freq: Every day | ORAL | Status: DC
  Administered 2017-10-08: 30 mg via ORAL
  Filled 2017-10-08: qty 1

## 2017-10-08 MED ORDER — LACTATED RINGERS IV SOLN
INTRAVENOUS | Status: DC | PRN
  Administered 2017-10-08: 07:00:00 via INTRAVENOUS

## 2017-10-08 MED ORDER — ALBUTEROL SULFATE (2.5 MG/3ML) 0.083% IN NEBU
2.5000 mg | INHALATION_SOLUTION | RESPIRATORY_TRACT | Status: AC
  Administered 2017-10-08: 2.5 mg via RESPIRATORY_TRACT
  Filled 2017-10-08: qty 3

## 2017-10-08 MED ORDER — LIDOCAINE 2% (20 MG/ML) 5 ML SYRINGE
INTRAMUSCULAR | Status: AC
  Filled 2017-10-08: qty 5

## 2017-10-08 MED ORDER — MIDAZOLAM HCL 2 MG/2ML IJ SOLN
INTRAMUSCULAR | Status: AC
  Filled 2017-10-08: qty 2

## 2017-10-08 MED ORDER — PROPOFOL 10 MG/ML IV BOLUS
INTRAVENOUS | Status: DC | PRN
  Administered 2017-10-08: 150 mg via INTRAVENOUS

## 2017-10-08 MED ORDER — SODIUM CHLORIDE 0.9 % IR SOLN
Status: DC | PRN
  Administered 2017-10-08: 500 mL

## 2017-10-08 MED ORDER — EPHEDRINE 5 MG/ML INJ
INTRAVENOUS | Status: AC
  Filled 2017-10-08: qty 10

## 2017-10-08 MED ORDER — ACETAMINOPHEN 650 MG RE SUPP: 650.0000 mg | RECTAL | Status: DC | PRN

## 2017-10-08 MED ORDER — MENTHOL 3 MG MT LOZG
1.0000 | LOZENGE | OROMUCOSAL | Status: DC | PRN
  Filled 2017-10-08: qty 9

## 2017-10-08 MED ORDER — HYDROCODONE-ACETAMINOPHEN 5-325 MG PO TABS
1.0000 | ORAL_TABLET | ORAL | Status: DC | PRN
  Administered 2017-10-08 (×2): 1 via ORAL
  Filled 2017-10-08 (×2): qty 1

## 2017-10-08 MED ORDER — ROCURONIUM BROMIDE 100 MG/10ML IV SOLN
INTRAVENOUS | Status: DC | PRN
  Administered 2017-10-08: 60 mg via INTRAVENOUS

## 2017-10-08 MED ORDER — OLOPATADINE HCL 0.1 % OP SOLN: 1.0000 [drp] | Freq: Two times a day (BID) | OPHTHALMIC | Status: DC

## 2017-10-08 MED ORDER — ACETAMINOPHEN 160 MG/5ML PO SOLN
960.0000 mg | Freq: Once | ORAL | Status: AC
  Administered 2017-10-08: 960 mg via ORAL
  Filled 2017-10-08: qty 40.6

## 2017-10-08 MED ORDER — CEFAZOLIN SODIUM-DEXTROSE 2-4 GM/100ML-% IV SOLN
2.0000 g | Freq: Three times a day (TID) | INTRAVENOUS | Status: DC
  Administered 2017-10-08: 2 g via INTRAVENOUS
  Filled 2017-10-08: qty 100

## 2017-10-08 MED ORDER — THROMBIN 5000 UNITS EX SOLR
CUTANEOUS | Status: AC
  Filled 2017-10-08: qty 10000

## 2017-10-08 SURGICAL SUPPLY — 59 items
BAG DECANTER FOR FLEXI CONT (MISCELLANEOUS) ×3 IMPLANT
BASKET BONE COLLECTION (BASKET) IMPLANT
BENZOIN TINCTURE PRP APPL 2/3 (GAUZE/BANDAGES/DRESSINGS) ×3 IMPLANT
BIT DRILL 2.3 12 FIXED (INSTRUMENTS) ×1 IMPLANT
BUR MATCHSTICK NEURO 3.0 LAGG (BURR) ×3 IMPLANT
CAGE CERVICAL 7 (Cage) ×1 IMPLANT
CAGE CERVICAL 7MM (Cage) ×1 IMPLANT
CANISTER SUCT 3000ML PPV (MISCELLANEOUS) ×3 IMPLANT
CARTRIDGE OIL MAESTRO DRILL (MISCELLANEOUS) ×1 IMPLANT
CLOSURE WOUND 1/2 X4 (GAUZE/BANDAGES/DRESSINGS) ×1
DERMABOND ADVANCED (GAUZE/BANDAGES/DRESSINGS) ×2
DERMABOND ADVANCED .7 DNX12 (GAUZE/BANDAGES/DRESSINGS) ×1 IMPLANT
DIFFUSER DRILL AIR PNEUMATIC (MISCELLANEOUS) ×3 IMPLANT
DRAPE C-ARM 42X72 X-RAY (DRAPES) ×6 IMPLANT
DRAPE LAPAROTOMY 100X72 PEDS (DRAPES) ×3 IMPLANT
DRAPE MICROSCOPE LEICA (MISCELLANEOUS) ×3 IMPLANT
DRAPE POUCH INSTRU U-SHP 10X18 (DRAPES) ×3 IMPLANT
DRILL 12MM (INSTRUMENTS) ×3
DRSG OPSITE POSTOP 3X4 (GAUZE/BANDAGES/DRESSINGS) ×3 IMPLANT
DURAPREP 6ML APPLICATOR 50/CS (WOUND CARE) ×3 IMPLANT
ELECT COATED BLADE 2.86 ST (ELECTRODE) ×3 IMPLANT
ELECT REM PT RETURN 9FT ADLT (ELECTROSURGICAL) ×3
ELECTRODE REM PT RTRN 9FT ADLT (ELECTROSURGICAL) ×1 IMPLANT
GAUZE SPONGE 4X4 16PLY XRAY LF (GAUZE/BANDAGES/DRESSINGS) IMPLANT
GLOVE BIO SURGEON STRL SZ7 (GLOVE) ×3 IMPLANT
GLOVE BIO SURGEON STRL SZ8 (GLOVE) ×3 IMPLANT
GLOVE BIOGEL PI IND STRL 7.0 (GLOVE) ×1 IMPLANT
GLOVE BIOGEL PI IND STRL 7.5 (GLOVE) ×2 IMPLANT
GLOVE BIOGEL PI INDICATOR 7.0 (GLOVE) ×2
GLOVE BIOGEL PI INDICATOR 7.5 (GLOVE) ×4
GLOVE SURG SS PI 7.5 STRL IVOR (GLOVE) ×6 IMPLANT
GOWN STRL REUS W/ TWL LRG LVL3 (GOWN DISPOSABLE) ×2 IMPLANT
GOWN STRL REUS W/ TWL XL LVL3 (GOWN DISPOSABLE) ×1 IMPLANT
GOWN STRL REUS W/TWL 2XL LVL3 (GOWN DISPOSABLE) IMPLANT
GOWN STRL REUS W/TWL LRG LVL3 (GOWN DISPOSABLE) ×4
GOWN STRL REUS W/TWL XL LVL3 (GOWN DISPOSABLE) ×2
HEMOSTAT POWDER KIT SURGIFOAM (HEMOSTASIS) ×3 IMPLANT
KIT BASIN OR (CUSTOM PROCEDURE TRAY) ×3 IMPLANT
KIT ROOM TURNOVER OR (KITS) ×3 IMPLANT
NEEDLE HYPO 25X1 1.5 SAFETY (NEEDLE) ×3 IMPLANT
NEEDLE SPNL 20GX3.5 QUINCKE YW (NEEDLE) ×3 IMPLANT
NS IRRIG 1000ML POUR BTL (IV SOLUTION) ×3 IMPLANT
OIL CARTRIDGE MAESTRO DRILL (MISCELLANEOUS) ×3
PACK LAMINECTOMY NEURO (CUSTOM PROCEDURE TRAY) ×3 IMPLANT
PAD ARMBOARD 7.5X6 YLW CONV (MISCELLANEOUS) ×3 IMPLANT
PIN DISTRACTION 14MM (PIN) ×6 IMPLANT
PLATE 14MM (Plate) ×3 IMPLANT
PUTTY BIOACTIVE 2CC KINEX (Miscellaneous) ×3 IMPLANT
RUBBERBAND STERILE (MISCELLANEOUS) ×6 IMPLANT
SCREW 12MM (Screw) ×12 IMPLANT
SPACER SPNL 7D LRG 16X14X7XNS (Cage) ×1 IMPLANT
SPCR SPNL 7D LRG 16X14X7XNS (Cage) ×1 IMPLANT
SPONGE INTESTINAL PEANUT (DISPOSABLE) ×3 IMPLANT
SPONGE SURGIFOAM ABS GEL SZ50 (HEMOSTASIS) ×3 IMPLANT
STRIP CLOSURE SKIN 1/2X4 (GAUZE/BANDAGES/DRESSINGS) ×2 IMPLANT
SUT VIC AB 3-0 SH 8-18 (SUTURE) ×6 IMPLANT
TOWEL GREEN STERILE (TOWEL DISPOSABLE) ×3 IMPLANT
TOWEL GREEN STERILE FF (TOWEL DISPOSABLE) ×3 IMPLANT
WATER STERILE IRR 1000ML POUR (IV SOLUTION) ×3 IMPLANT

## 2017-10-08 NOTE — Op Note (Signed)
10/08/2017  9:12 AM  PATIENT:  Alice Santiago  37 y.o. 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 Decompressive anterior cervical discectomy C5-6, 2. Anterior cervical arthrodesis C5-6 utilizing a peek interbody cage packed with locally harvested morcellized autologous bone graftmixed with morcellized allograft, 3. Anterior cervical plating C5-6 utilizing an Alphatecplate  SURGEON:  Marikay Alaravid Lamberto Dinapoli, MD  ASSISTANTSLovell Sheehan: jenkins M.D.  ANESTHESIA:   General  EBL: 50 ml  Total I/O In: 1200 [I.V.:1200] Out: 50 [Blood:50]  BLOOD ADMINISTERED: none  DRAINS: none  SPECIMEN:  none  INDICATION FOR PROCEDURE: This patient presented with severe neck and arm pain. Imaging showed herniated disc C5-6. The patient tried conservative measures without relief. Pain was debilitating. Recommended ACDF with plating. Patient understood the risks, benefits, and alternatives and potential outcomes and wished to proceed.  PROCEDURE DETAILS: Patient was brought to the operating room placed under general endotracheal anesthesia. Patient was placed in the supine position on the operating room table. The neck was prepped with Duraprep and draped in a sterile fashion.   Three cc of local anesthesia was injected and a transverse incision was made on the right side of the neck.  Dissection was carried down thru the subcutaneous tissue and the platysma was  elevated, opened, and undermined with Metzenbaum scissors.  Dissection was then carried out thru an avascular plane leaving the sternocleidomastoid carotid artery and jugular vein laterally and the trachea and esophagus medially. The ventral aspect of the vertebral column was identified and a localizing x-ray was taken. The C5-6 level was identified and confirmed by all in the room. The longus colli muscles were then elevated and the retractor was placed. The annulus was incised and the disc space  entered. Discectomy was performed with micro-curettes and pituitary rongeurs. I then used the high-speed drill to drill the endplates down to the level of the posterior longitudinal ligament. The drill shavings were saved in a mucous trap for later arthrodesis. The operating microscope was draped and brought into the field provided additional magnification, illumination and visualization. Discectomy was continued posteriorly thru the disc space. Posterior longitudinal ligament was opened with a nerve hook, and then removed along with disc herniation and osteophytes, decompressing the spinal canal and thecal sac. We then continued to remove osteophytic overgrowth and disc material decompressing the neural foramina and exiting nerve roots bilaterally. The scope was angled up and down to help decompress and undercut the vertebral bodies. Once the decompression was completed we could pass a nerve hook circumferentially to assure adequate decompression in the midline and in the neural foramina. So by both visualization and palpation we felt we had an adequate decompression of the neural elements. We then measured the height of the intravertebral disc space and selected a 7 millimeter Peek interbody cage packed with autograft and morcellized allograft. It was then gently positioned in the intravertebral disc space(s) and countersunk. I then used a 14 mm atec plate and placed variable angle screws into the vertebral bodies of each level and locked them into position. The wound was irrigated with bacitracin solution, checked for hemostasis which was established and confirmed. Once meticulous hemostasis was achieved, we then proceeded with closure. The platysma was closed with interrupted 3-0 undyed Vicryl suture, the subcuticular layer was closed with interrupted 3-0 undyed Vicryl suture. The skin edges were approximated with steristrips. The drapes were removed. A sterile dressing was applied. The patient was then awakened  from general  anesthesia and transferred to the recovery room in stable condition. At the end of the procedure all sponge, needle and instrument counts were correct.   PLAN OF CARE: Admit for overnight observation  PATIENT DISPOSITION:  PACU - hemodynamically stable.   Delay start of Pharmacological VTE agent (>24hrs) due to surgical blood loss or risk of bleeding:  yes

## 2017-10-08 NOTE — Transfer of Care (Signed)
Immediate Anesthesia Transfer of Care Note  Patient: Alice Santiago  Procedure(s) Performed: Anterior Cervical Decompression/Discectomy Fusion - Cervical five-Cervical six (Spine Cervical)  Patient Location: PACU  Anesthesia Type:General  Level of Consciousness: awake, alert , oriented and patient cooperative  Airway & Oxygen Therapy: Patient Spontanous Breathing and Patient connected to nasal cannula oxygen  Post-op Assessment: Report given to RN, Post -op Vital signs reviewed and stable and Patient moving all extremities X 4  Post vital signs: Reviewed and stable  Last Vitals:  Vitals:   10/08/17 0617 10/08/17 0918  BP:    Pulse:    Resp:    Temp: 36.6 C 36.5 C  SpO2:      Last Pain:  Vitals:   10/08/17 0918  PainSc: 10-Worst pain ever      Patients Stated Pain Goal: 3 (10/08/17 0601)  Complications: No apparent anesthesia complications

## 2017-10-08 NOTE — H&P (Signed)
Subjective:   Patient is a 37 y.o. female admitted for ACDF. The patient first presented to me with complaints of neck pain and numbness of the arm(s). Onset of symptoms was several years ago. The pain is described as aching and occurs all day. The pain is rated severe, unremitting, and is located in the neck. The symptoms have been progressive. Symptoms are exacerbated by none, and are relieved by none.  Previous work up includes MRI of cervical spine, results: spinal stenosis.  Past Medical History:  Diagnosis Date  . Anxiety   . Asthma   . Back problem   . Breast tenderness in female 04/23/2015  . Bronchitis    currently being treated for bronchititis,no fever  . Bronchitis    hx  . BV (bacterial vaginosis) 03/14/2015  . Chlamydia    history of   . Depression   . History of chlamydia 04/23/2015  . Vaginal discharge 03/14/2015  . Vaginal Pap smear, abnormal     Past Surgical History:  Procedure Laterality Date  . ABDOMINAL HYSTERECTOMY    . CESAREAN SECTION     times 2  . LUMBAR LAMINECTOMY/DECOMPRESSION MICRODISCECTOMY Left 11/10/2014   Procedure: LUMBAR LAMINECTOMY/DECOMPRESSION MICRODISCECTOMY LEFT LUMBAR FIVE-SACRAL ONE;  Surgeon: Tia Alert, MD;  Location: [facility];  Service: Neurosurgery;  Laterality: Left;  left  . TONSILLECTOMY    . TUBAL LIGATION      No Known Allergies  Social History   Tobacco Use  . Smoking status: Current Every Day Smoker    Packs/day: 1.00    Years: 21.00    Pack years: 21.00    Types: Cigarettes  . Smokeless tobacco: Never Used  Substance Use Topics  . Alcohol use: Yes    Comment: occ    Family History  Problem Relation Age of Onset  . Depression Mother   . Anxiety disorder Mother   . Heart murmur Father   . Other Son        problems with bowels  . Asthma Son   . ADD / ADHD Son   . ODD Son   . Asthma Son   . Cancer Neg Hx    Prior to Admission medications   Medication Sig Start Date End Date Taking? Authorizing Provider   albuterol [facility] HFA) 108 (90 Base) MCG/ACT inhaler USE 2 PUFFS EVERY 6 HOURS AS NEEDED 09/29/17  Yes Remus Loffler, PA-C  ALPRAZolam Prudy Feeler) 1 MG tablet Take 1 tablet (1 mg total) by mouth 4 (four) times daily as needed for anxiety. 09/29/17  Yes Remus Loffler, PA-C  cetirizine (ZYRTEC) 10 MG tablet Take 1 tablet (10 mg total) by mouth daily. 09/29/17  Yes Remus Loffler, PA-C  DULoxetine (CYMBALTA) 30 MG capsule Take 1 capsule (30 mg total) by mouth daily. 09/29/17  Yes Remus Loffler, PA-C  EPINEPHrine 0.3 mg/0.3 mL IJ SOAJ injection Inject 0.3 mg into the muscle once.   Yes [provider]  ibuprofen (ADVIL,MOTRIN) 800 MG tablet Take 800 mg by mouth every 8 (eight) hours as needed for headache or moderate pain.   Yes [provider]  Olopatadine HCl (PATADAY) 0.2 % SOLN Apply 1 drop to eye 3 (three) times daily - between meals and at bedtime. 09/30/17   Remus Loffler, PA-C     Review of Systems  Positive ROS: neg  All other systems have been reviewed and were otherwise negative with the exception of those mentioned in the HPI and as above.  Objective: Vital signs in last 24 hours: Temp:  [97.9 F (36.6 C)] 97.9 F (36.6 C) (01/31 0617) Pulse Rate:  [70] 70 (01/31 0601) Resp:  [18] 18 (01/31 0601) BP: (115)/(65) 115/65 (01/31 0601) SpO2:  [99 %] 99 % (01/31 0601) Weight:  [89.4 kg (197 lb)] 89.4 kg (197 lb) (01/31 0601)  General Appearance: Alert, cooperative, no distress, appears stated age Head: Normocephalic, without obvious abnormality, atraumatic Eyes: PERRL, conjunctiva/corneas clear, EOM's intact      Neck: Supple, symmetrical, trachea midline, Back: Symmetric, no curvature, ROM normal, no CVA tenderness Lungs:  respirations unlabored Heart: Regular rate and rhythm Abdomen: Soft, non-tender Extremities: Extremities normal, atraumatic, no cyanosis or edema Pulses: 2+ and symmetric all extremities Skin: Skin color, texture, turgor normal, no rashes or  lesions  NEUROLOGIC:  Mental status: Alert and oriented x4, no aphasia, good attention span, fund of knowledge and memory  Motor Exam - grossly normal Sensory Exam - grossly normal Reflexes: neg Coordination - grossly normal Gait - grossly normal Balance - grossly normal Cranial Nerves: I: smell Not tested  II: visual acuity  OS: nl    OD: nl  II: visual fields Full to confrontation  II: pupils Equal, round, reactive to light  III,VII: ptosis None  III,IV,VI: extraocular muscles  Full ROM  V: mastication Normal  V: facial light touch sensation  Normal  V,VII: corneal reflex  Present  VII: facial muscle function - upper  Normal  VII: facial muscle function - lower Normal  VIII: hearing Not tested  IX: soft palate elevation  Normal  IX,X: gag reflex Present  XI: trapezius strength  5/5  XI: sternocleidomastoid strength 5/5  XI: neck flexion strength  5/5  XII: tongue strength  Normal    Data Review Lab Results  Component Value Date   WBC 12.1 (H) 09/25/2017   HGB 13.4 09/25/2017   HCT 40.7 09/25/2017   MCV 97.4 09/25/2017   PLT 294 09/25/2017   Lab Results  Component Value Date   NA 142 09/25/2017   K 3.7 09/25/2017   CL 109 09/25/2017   CO2 24 09/25/2017   BUN 13 09/25/2017   CREATININE 0.77 09/25/2017   GLUCOSE 124 (H) 09/25/2017   Lab Results  Component Value Date   INR 0.92 09/25/2017    Assessment:   Cervical neck pain with herniated nucleus pulposus/ spondylosis/ stenosis at C5-6. Estimated body mass index is 34.9 kg/m as calculated from the following:   Height as of this encounter: 5\' 3"  (1.6 m).   Weight as of this encounter: 89.4 kg (197 lb).  Patient has failed conservative therapy. Planned surgery : ACDF C5-6  Plan:   I explained the condition and procedure to the patient and answered any questions.  Patient wishes to proceed with procedure as planned. Understands risks/ benefits/ and expected or typical outcomes.  Maygan Koeller S 10/08/2017  7:33 AM

## 2017-10-08 NOTE — Anesthesia Procedure Notes (Addendum)
Procedure Name: Intubation Date/Time: 10/08/2017 7:52 AM Performed by: Lowella Dell, CRNA Pre-anesthesia Checklist: Patient identified, Emergency Drugs available, Suction available and Patient being monitored Patient Re-evaluated:Patient Re-evaluated prior to induction Oxygen Delivery Method: Circle System Utilized Preoxygenation: Pre-oxygenation with 100% oxygen Induction Type: IV induction Ventilation: Mask ventilation without difficulty Laryngoscope Size: Mac and 3 Grade View: Grade I Tube type: Oral Tube size: 7.0 mm Number of attempts: 1 Airway Equipment and Method: Stylet Placement Confirmation: ETT inserted through vocal cords under direct vision,  positive ETCO2 and breath sounds checked- equal and bilateral Secured at: 21 cm Tube secured with: Tape Dental Injury: Teeth and Oropharynx as per pre-operative assessment

## 2017-10-08 NOTE — Progress Notes (Signed)
Pt doing well. Pt given D/C instructions with Rx's, verbal understanding was provided. Pt's incision is clean and dry with no sign of infection. Pt's IV was removed prior to D/C. Pt D/C'd home via wheelchair @ 1845 per MD order. Pt is stable @ D/C and has no other needs at this time. Rema FendtAshley Laia Wiley, RN

## 2017-10-08 NOTE — Anesthesia Postprocedure Evaluation (Signed)
Anesthesia Post Note  Patient: Alice Santiago  Procedure(s) Performed: Anterior Cervical Decompression/Discectomy Fusion - Cervical five-Cervical six (Spine Cervical)     Patient location during evaluation: PACU Anesthesia Type: General Level of consciousness: awake and alert Pain management: pain level controlled Vital Signs Assessment: post-procedure vital signs reviewed and stable Respiratory status: spontaneous breathing, nonlabored ventilation, respiratory function stable and patient connected to nasal cannula oxygen Cardiovascular status: blood pressure returned to baseline and stable Postop Assessment: no apparent nausea or vomiting Anesthetic complications: no    Last Vitals:  Vitals:   10/08/17 1030 10/08/17 1100  BP: 138/90 114/69  Pulse: 88 72  Resp: 15 16  Temp: 36.7 C (!) 36.4 C  SpO2: 99% 98%    Last Pain:  Vitals:   10/08/17 1100  PainSc: 3                  Carlea Badour EDWARD

## 2017-10-08 NOTE — Discharge Summary (Signed)
Physician Discharge Summary  Patient ID: Alice Santiago MRN: [id number] DOB/AGE: Feb 20, 1981 37 y.o.  Admit date: 10/08/2017 Discharge date: 10/08/2017  Admission Diagnoses: cervical HNP    Discharge Diagnoses: same   Discharged Condition: good  Hospital Course: The patient was admitted on 10/08/2017 and taken to the operating room where the patient underwent ACDF C5-6. The patient tolerated the procedure well and was taken to the recovery room and then to the floor in stable condition. The hospital course was routine. There were no complications. The wound remained clean dry and intact. Pt had appropriate neck soreness. No complaints of arm pain or new N/T/W. The patient remained afebrile with stable vital signs, and tolerated a regular diet. The patient continued to increase activities, and pain was well controlled with oral pain medications.   Consults: None  Significant Diagnostic Studies:  Results for orders placed or performed during the hospital encounter of 09/25/17  Surgical pcr screen  Result Value Ref Range   MRSA, PCR NEGATIVE NEGATIVE   Staphylococcus aureus NEGATIVE NEGATIVE  Basic metabolic panel  Result Value Ref Range   Sodium 142 135 - 145 mmol/L   Potassium 3.7 3.5 - 5.1 mmol/L   Chloride 109 101 - 111 mmol/L   CO2 24 22 - 32 mmol/L   Glucose, Bld 124 (H) 65 - 99 mg/dL   BUN 13 6 - 20 mg/dL   Creatinine, Ser 4.090.77 0.44 - 1.00 mg/dL   Calcium 9.2 8.9 - 81.110.3 mg/dL   GFR calc non Af Amer >60 >60 mL/min   GFR calc Af Amer >60 >60 mL/min   Anion gap 9 5 - 15  CBC WITH DIFFERENTIAL  Result Value Ref Range   WBC 12.1 (H) 4.0 - 10.5 K/uL   RBC 4.18 3.87 - 5.11 MIL/uL   Hemoglobin 13.4 12.0 - 15.0 g/dL   HCT 91.440.7 78.236.0 - 95.646.0 %   MCV 97.4 78.0 - 100.0 fL   MCH 32.1 26.0 - 34.0 pg   MCHC 32.9 30.0 - 36.0 g/dL   RDW 21.313.5 08.611.5 - 57.815.5 %   Platelets 294 150 - 400 K/uL   Neutrophils Relative % 74 %   Neutro Abs 9.0 (H) 1.7 - 7.7 K/uL   Lymphocytes Relative 19 %   Lymphs Abs 2.3 0.7 - 4.0 K/uL   Monocytes Relative 6 %   Monocytes Absolute 0.7 0.1 - 1.0 K/uL   Eosinophils Relative 1 %   Eosinophils Absolute 0.1 0.0 - 0.7 K/uL   Basophils Relative 0 %   Basophils Absolute 0.0 0.0 - 0.1 K/uL  Protime-INR  Result Value Ref Range   Prothrombin Time 12.3 11.4 - 15.2 seconds   INR 0.92     Chest 2 View  Result Date: 09/25/2017 CLINICAL DATA:  Preoperative chest x-ray. EXAM: CHEST  2 VIEW COMPARISON:  10/08/2016. FINDINGS: Mediastinum hilar structures normal. Lungs are clear. No pleural effusion or pneumothorax. No acute bony abnormality. IMPRESSION: No active cardiopulmonary disease. Electronically Signed   By: Maisie Fushomas  Register   On: 09/25/2017 13:29   Dg Cervical Spine 2-3 Views  Result Date: 10/08/2017 CLINICAL DATA:  ACDF C5-6 EXAM: CERVICAL SPINE - 2-3 VIEW; DG C-ARM 61-120 MIN COMPARISON:  07/21/2017 cervical spine MRI FINDINGS: Fluoroscopy time 0 minutes 21 seconds. 2 nondiagnostic spot fluoroscopic intraoperative lateral cervical spine radiographs demonstrate partially visualized anterior cervical plate with interlocking screws at the C5-6 level. IMPRESSION: Intraoperative fluoroscopic guidance for ACDF C5-6. Electronically Signed   By: Jannifer RodneyJason A Poff M.D.  On: 10/08/2017 09:10   Dg C-arm 1-60 Min  Result Date: 10/08/2017 CLINICAL DATA:  ACDF C5-6 EXAM: CERVICAL SPINE - 2-3 VIEW; DG C-ARM 61-120 MIN COMPARISON:  07/21/2017 cervical spine MRI FINDINGS: Fluoroscopy time 0 minutes 21 seconds. 2 nondiagnostic spot fluoroscopic intraoperative lateral cervical spine radiographs demonstrate partially visualized anterior cervical plate with interlocking screws at the C5-6 level. IMPRESSION: Intraoperative fluoroscopic guidance for ACDF C5-6. Electronically Signed   By: Delbert Phenix M.D.   On: 10/08/2017 09:10    Antibiotics:  Anti-infectives (From admission, onward)   Start     Dose/Rate Route Frequency Ordered Stop   10/08/17 1600  ceFAZolin (ANCEF) IVPB  2g/100 mL premix     2 g 200 mL/hr over 30 Minutes Intravenous Every 8 hours 10/08/17 1059 10/09/17 0759   10/08/17 0822  bacitracin 50,000 Units in sodium chloride irrigation 0.9 % 500 mL irrigation  Status:  Discontinued       As needed 10/08/17 0822 10/08/17 0912   10/08/17 0553  ceFAZolin (ANCEF) 2-4 GM/100ML-% IVPB    Comments:  Scronce, Trina   : cabinet override      10/08/17 0553 10/08/17 0759   10/08/17 0546  ceFAZolin (ANCEF) IVPB 2g/100 mL premix     2 g 200 mL/hr over 30 Minutes Intravenous On call to O.R. 10/08/17 0546 10/08/17 0814      Discharge Exam: Blood pressure 114/69, pulse 72, temperature (!) 97.5 F (36.4 C), resp. rate 16, height 5\' 3"  (1.6 m), weight 89.4 kg (197 lb), SpO2 98 %. Neurologic: Grossly normal Dressing dry  Discharge Medications:   Allergies as of 10/08/2017   No Known Allergies     Medication List    TAKE these medications   albuterol 108 (90 Base) MCG/ACT inhaler Commonly known as:  PROAIR HFA USE 2 PUFFS EVERY 6 HOURS AS NEEDED   ALPRAZolam 1 MG tablet Commonly known as:  XANAX Take 1 tablet (1 mg total) by mouth 4 (four) times daily as needed for anxiety.   cetirizine 10 MG tablet Commonly known as:  ZYRTEC Take 1 tablet (10 mg total) by mouth daily.   DULoxetine 30 MG capsule Commonly known as:  CYMBALTA Take 1 capsule (30 mg total) by mouth daily.   EPINEPHrine 0.3 mg/0.3 mL Soaj injection Commonly known as:  EPI-PEN Inject 0.3 mg into the muscle once.   HYDROcodone-acetaminophen 5-325 MG tablet Commonly known as:  NORCO/VICODIN Take 1 tablet by mouth every 4 (four) hours as needed for moderate pain ((score 4 to 6)).   ibuprofen 800 MG tablet Commonly known as:  ADVIL,MOTRIN Take 800 mg by mouth every 8 (eight) hours as needed for headache or moderate pain.   methocarbamol 500 MG tablet Commonly known as:  ROBAXIN Take 1 tablet (500 mg total) by mouth every 6 (six) hours as needed for muscle spasms.   Olopatadine  HCl 0.2 % Soln Commonly known as:  PATADAY Apply 1 drop to eye 3 (three) times daily - between meals and at bedtime.       Disposition: home   Final Dx: ACDF C5-6  Discharge Instructions     Remove dressing in 72 hours   Complete by:  As directed    Call MD for:  difficulty breathing, headache or visual disturbances   Complete by:  As directed    Call MD for:  persistant nausea and vomiting   Complete by:  As directed    Call MD for:  redness, tenderness, or signs of  infection (pain, swelling, redness, odor or green/yellow discharge around incision site)   Complete by:  As directed    Call MD for:  severe uncontrolled pain   Complete by:  As directed    Call MD for:  temperature >100.4   Complete by:  As directed    Diet - low sodium heart healthy   Complete by:  As directed    Increase activity slowly   Complete by:  As directed          Signed: Laniyah Rosenwald S 10/08/2017, 4:28 PM

## 2017-10-09 ENCOUNTER — Encounter (HOSPITAL_COMMUNITY): Payer: Self-pay | Admitting: Neurological Surgery

## 2017-10-09 MED FILL — Thrombin For Soln 5000 Unit: CUTANEOUS | Qty: 5000 | Status: AC

## 2017-10-10 ENCOUNTER — Telehealth: Payer: Self-pay

## 2017-10-10 NOTE — Telephone Encounter (Signed)
[facility] - left message.  

## 2017-10-20 ENCOUNTER — Telehealth: Payer: Self-pay

## 2017-10-20 NOTE — Telephone Encounter (Signed)
[facility] - Writer left a voice mail message on 10-02-2017; 10-10-2017 and 10-20-2017.  Patient will be placed in the inactive list with [facility].   Writer will inform the PCP and Dr. Vanetta ShawlHisada through epic in basket email

## 2017-11-17 ENCOUNTER — Encounter: Payer: Self-pay | Admitting: Physician Assistant

## 2017-11-17 ENCOUNTER — Ambulatory Visit: Payer: Medicaid Other | Admitting: Physician Assistant

## 2017-11-17 VITALS — BP 115/75 | HR 93 | Temp 97.5°F | Ht 63.0 in | Wt 199.6 lb

## 2017-11-17 DIAGNOSIS — J0100 Acute maxillary sinusitis, unspecified: Secondary | ICD-10-CM | POA: Diagnosis not present

## 2017-11-17 DIAGNOSIS — F39 Unspecified mood [affective] disorder: Secondary | ICD-10-CM | POA: Diagnosis not present

## 2017-11-17 MED ORDER — DULOXETINE HCL 60 MG PO CPEP: 60.0000 mg | ORAL_CAPSULE | Freq: Every day | ORAL | 5 refills | Status: DC

## 2017-11-17 MED ORDER — AMOXICILLIN-POT CLAVULANATE 875-125 MG PO TABS: 1.0000 | ORAL_TABLET | Freq: Two times a day (BID) | ORAL | 0 refills | Status: DC

## 2017-11-17 MED ORDER — FLUCONAZOLE 150 MG PO TABS: 150.0000 mg | ORAL_TABLET | Freq: Once | ORAL | 0 refills | Status: AC

## 2017-11-17 MED ORDER — ALPRAZOLAM 1 MG PO TABS: 1.0000 mg | ORAL_TABLET | Freq: Four times a day (QID) | ORAL | 2 refills | Status: DC | PRN

## 2017-11-19 NOTE — Progress Notes (Signed)
BP 115/75   Pulse 93   Temp (!) 97.5 F (36.4 C) (Oral)   Ht _0  (1.6 m)   Wt 199 lb 9.6 oz (90.5 kg)   BMI 35.36 kg/m    Subjective:    Patient ID: Alice Santiago, female    DOB: [birth year], 37 y.o.   MRN: [id number]  HPI: Alice Santiago is a 37 y.o. female presenting on 11/17/2017 for Follow-up (6 week ); Headache; Cough; and Nasal Congestion  This patient comes in for periodic recheck on medications and conditions including depression and mood.  This patient has had many days of sinus headache and postnasal drainage. There is copious drainage at times. Denies any fever at this time. There has been a history of sinus infections in the past.  No history of sinus surgery. There is cough at night. It has become more prevalent in recent days.  All medications are reviewed today. There are no reports of any problems with the medications. All of the medical conditions are reviewed and updated.  Lab work is reviewed and will be ordered as medically necessary. There are no new problems reported with today's visit.   Past Medical History:  Diagnosis Date  . Anxiety   . Asthma   . Back problem   . Breast tenderness in female 04/23/2015  . Bronchitis    currently being treated for bronchititis,no fever  . Bronchitis    hx  . BV (bacterial vaginosis) 03/14/2015  . Chlamydia    history of   . Depression   . History of chlamydia 04/23/2015  . Vaginal discharge 03/14/2015  . Vaginal Pap smear, abnormal    Relevant past medical, surgical, family and social history reviewed and updated as indicated. Interim medical history since our last visit reviewed. Allergies and medications reviewed and updated. DATA REVIEWED: CHART IN EPIC  Family History reviewed for pertinent findings.  Review of Systems  Constitutional: Positive for chills and fatigue. Negative for activity change and appetite change.  HENT: Positive for congestion, postnasal drip and sore throat.   Eyes: Negative.     Respiratory: Positive for cough and wheezing.   Cardiovascular: Negative.  Negative for chest pain, palpitations and leg swelling.  Gastrointestinal: Negative.   Genitourinary: Negative.   Musculoskeletal: Negative.   Skin: Negative.   Neurological: Positive for headaches.    Allergies as of 11/17/2017   No Known Allergies     Medication List        Accurate as of 11/17/17 11:59 PM. Always use your most recent med list.          albuterol 108 (90 Base) MCG/ACT inhaler Commonly known as:  PROAIR HFA USE 2 PUFFS EVERY 6 HOURS AS NEEDED   ALPRAZolam 1 MG tablet Commonly known as:  XANAX Take 1 tablet (1 mg total) by mouth 4 (four) times daily as needed for anxiety.   amoxicillin-clavulanate 875-125 MG tablet Commonly known as:  AUGMENTIN Take 1 tablet by mouth 2 (two) times daily.   cetirizine 10 MG tablet Commonly known as:  ZYRTEC Take 1 tablet (10 mg total) by mouth daily.   DULoxetine 60 MG capsule Commonly known as:  CYMBALTA Take 1 capsule (60 mg total) by mouth daily.   EPINEPHrine 0.3 mg/0.3 mL Soaj injection Commonly known as:  EPI-PEN Inject 0.3 mg into the muscle once.   fluconazole 150 MG tablet Commonly known as:  DIFLUCAN Take 1 tablet (150 mg total) by mouth once for 1 dose.  NARCAN 4 MG/0.1ML Liqd nasal spray kit Generic drug:  naloxone CALL 911. ADMINISTER A SINGLE SPRAY OF NARCAN IN ONE NOSTRIL, REPEAT EVERY 3 MINUTES AS NEEDED IF NO OR MINIMAL RESPONSE   Olopatadine HCl 0.2 % Soln Commonly known as:  PATADAY Apply 1 drop to eye 3 (three) times daily - between meals and at bedtime.   oxyCODONE-acetaminophen 5-325 MG tablet Commonly known as:  PERCOCET/ROXICET Take 1 tablet by mouth every 6 (six) hours as needed.          Objective:    BP 115/75   Pulse 93   Temp (!) 97.5 F (36.4 C) (Oral)   Ht '5\' 3"'$  (1.6 m)   Wt 199 lb 9.6 oz (90.5 kg)   BMI 35.36 kg/m   No Known Allergies  Wt Readings from Last 3 Encounters:  11/17/17 199  lb 9.6 oz (90.5 kg)  10/08/17 197 lb (89.4 kg)  09/29/17 197 lb 3.2 oz (89.4 kg)    Physical Exam  Constitutional: She is oriented to person, place, and time. She appears well-developed and well-nourished.  HENT:  Head: Normocephalic and atraumatic.  Right Ear: Tympanic membrane and external ear normal. No middle ear effusion.  Left Ear: Tympanic membrane and external ear normal.  No middle ear effusion.  Nose: Mucosal edema and rhinorrhea present. Right sinus exhibits no maxillary sinus tenderness. Left sinus exhibits no maxillary sinus tenderness.  Mouth/Throat: Uvula is midline. Posterior oropharyngeal erythema present.  Eyes: Conjunctivae and EOM are normal. Pupils are equal, round, and reactive to light. Right eye exhibits no discharge. Left eye exhibits no discharge.  Neck: Normal range of motion.  Cardiovascular: Normal rate, regular rhythm and normal heart sounds.  Pulmonary/Chest: Effort normal and breath sounds normal. No respiratory distress. She has no wheezes.  Abdominal: Soft.  Lymphadenopathy:    She has no cervical adenopathy.  Neurological: She is alert and oriented to person, place, and time.  Skin: Skin is warm and dry.  Psychiatric: She has a normal mood and affect.    Results for orders placed or performed during the hospital encounter of 09/25/17  Surgical pcr screen  Result Value Ref Range   MRSA, PCR NEGATIVE NEGATIVE   Staphylococcus aureus NEGATIVE NEGATIVE  Basic metabolic panel  Result Value Ref Range   Sodium 142 135 - 145 mmol/L   Potassium 3.7 3.5 - 5.1 mmol/L   Chloride 109 101 - 111 mmol/L   CO2 24 22 - 32 mmol/L   Glucose, Bld 124 (H) 65 - 99 mg/dL   BUN 13 6 - 20 mg/dL   Creatinine, Ser 0.77 0.44 - 1.00 mg/dL   Calcium 9.2 8.9 - 10.3 mg/dL   GFR calc non Af Amer >60 >60 mL/min   GFR calc Af Amer >60 >60 mL/min   Anion gap 9 5 - 15  CBC WITH DIFFERENTIAL  Result Value Ref Range   WBC 12.1 (H) 4.0 - 10.5 K/uL   RBC 4.18 3.87 - 5.11 MIL/uL     Hemoglobin 13.4 12.0 - 15.0 g/dL   HCT 40.7 36.0 - 46.0 %   MCV 97.4 78.0 - 100.0 fL   MCH 32.1 26.0 - 34.0 pg   MCHC 32.9 30.0 - 36.0 g/dL   RDW 13.5 11.5 - 15.5 %   Platelets 294 150 - 400 K/uL   Neutrophils Relative % 74 %   Neutro Abs 9.0 (H) 1.7 - 7.7 K/uL   Lymphocytes Relative 19 %   Lymphs Abs 2.3 0.7 -  4.0 K/uL   Monocytes Relative 6 %   Monocytes Absolute 0.7 0.1 - 1.0 K/uL   Eosinophils Relative 1 %   Eosinophils Absolute 0.1 0.0 - 0.7 K/uL   Basophils Relative 0 %   Basophils Absolute 0.0 0.0 - 0.1 K/uL  Protime-INR  Result Value Ref Range   Prothrombin Time 12.3 11.4 - 15.2 seconds   INR 0.92       Assessment & Plan:   1. Acute non-recurrent maxillary sinusitis - amoxicillin-clavulanate (AUGMENTIN) 875-125 MG tablet; Take 1 tablet by mouth 2 (two) times daily.  Dispense: 20 tablet; Refill: 0 - fluconazole (DIFLUCAN) 150 MG tablet; Take 1 tablet (150 mg total) by mouth once for 1 dose.  Dispense: 1 tablet; Refill: 0  2. Mood disorder (HCC) - DULoxetine (CYMBALTA) 60 MG capsule; Take 1 capsule (60 mg total) by mouth daily.  Dispense: 30 capsule; Refill: 5 - ALPRAZolam (XANAX) 1 MG tablet; Take 1 tablet (1 mg total) by mouth 4 (four) times daily as needed for anxiety.  Dispense: 120 tablet; Refill: 2   Continue all other maintenance medications as listed above.  Follow up plan: Return in about 6 months (around 05/20/2018) for recheck.  Educational handout given for [place] PA-C [place] [address]  [place], [place] [address] [phone]   11/19/2017, 11:04 PM

## 2018-02-10 ENCOUNTER — Other Ambulatory Visit: Payer: Self-pay | Admitting: Physician Assistant

## 2018-02-10 DIAGNOSIS — F39 Unspecified mood [affective] disorder: Secondary | ICD-10-CM

## 2018-03-08 ENCOUNTER — Telehealth: Payer: Self-pay | Admitting: Physician Assistant

## 2018-03-10 ENCOUNTER — Encounter (HOSPITAL_COMMUNITY): Payer: Self-pay | Admitting: Emergency Medicine

## 2018-03-10 ENCOUNTER — Emergency Department (HOSPITAL_COMMUNITY)
Admission: EM | Admit: 2018-03-10 | Discharge: 2018-03-10 | Disposition: A | Discharge status: Home / Self Care | Payer: Medicaid Other | Attending: Emergency Medicine | Admitting: Emergency Medicine

## 2018-03-10 ENCOUNTER — Encounter: Payer: Self-pay | Admitting: Physician Assistant

## 2018-03-10 ENCOUNTER — Other Ambulatory Visit: Payer: Self-pay

## 2018-03-10 ENCOUNTER — Ambulatory Visit: Payer: Medicaid Other | Admitting: Physician Assistant

## 2018-03-10 ENCOUNTER — Emergency Department (HOSPITAL_COMMUNITY): Payer: Medicaid Other

## 2018-03-10 VITALS — BP 108/75 | HR 79 | Temp 98.3°F | Ht 63.0 in | Wt 195.2 lb

## 2018-03-10 DIAGNOSIS — J4540 Moderate persistent asthma, uncomplicated: Secondary | ICD-10-CM | POA: Diagnosis not present

## 2018-03-10 DIAGNOSIS — Z8742 Personal history of other diseases of the female genital tract: Secondary | ICD-10-CM | POA: Diagnosis not present

## 2018-03-10 DIAGNOSIS — R109 Unspecified abdominal pain: Secondary | ICD-10-CM

## 2018-03-10 DIAGNOSIS — R102 Pelvic and perineal pain: Secondary | ICD-10-CM

## 2018-03-10 DIAGNOSIS — R634 Abnormal weight loss: Secondary | ICD-10-CM | POA: Diagnosis not present

## 2018-03-10 DIAGNOSIS — F17210 Nicotine dependence, cigarettes, uncomplicated: Secondary | ICD-10-CM | POA: Insufficient documentation

## 2018-03-10 DIAGNOSIS — Z79899 Other long term (current) drug therapy: Secondary | ICD-10-CM | POA: Diagnosis not present

## 2018-03-10 DIAGNOSIS — R5382 Chronic fatigue, unspecified: Secondary | ICD-10-CM

## 2018-03-10 LAB — MICROSCOPIC EXAMINATION
RBC MICROSCOPIC, UA: NONE SEEN /HPF (ref 0–2)
RENAL EPITHEL UA: NONE SEEN /HPF

## 2018-03-10 LAB — URINALYSIS, COMPLETE
Bilirubin, UA: NEGATIVE
Glucose, UA: NEGATIVE
Ketones, UA: NEGATIVE
LEUKOCYTES UA: NEGATIVE
NITRITE UA: NEGATIVE
PH UA: 5.5 (ref 5.0–7.5)
RBC, UA: NEGATIVE
Specific Gravity, UA: >1.03 — ABNORMAL HIGH (ref 1.005–1.030)
Urobilinogen, Ur: 0.2 mg/dL (ref 0.2–1.0)

## 2018-03-10 LAB — URINALYSIS, ROUTINE W REFLEX MICROSCOPIC
Bilirubin Urine: NEGATIVE
Glucose, UA: NEGATIVE mg/dL
Hgb urine dipstick: NEGATIVE
Ketones, ur: NEGATIVE mg/dL
LEUKOCYTES UA: NEGATIVE
NITRITE: NEGATIVE
PROTEIN: NEGATIVE mg/dL
Specific Gravity, Urine: 1.023 (ref 1.005–1.030)
pH: 7 (ref 5.0–8.0)

## 2018-03-10 LAB — CBC
HCT: 40.3 % (ref 36.0–46.0)
Hemoglobin: 13.6 g/dL (ref 12.0–15.0)
MCH: 32.1 pg (ref 26.0–34.0)
MCHC: 33.7 g/dL (ref 30.0–36.0)
MCV: 95 fL (ref 78.0–100.0)
PLATELETS: 284 10*3/uL (ref 150–400)
RBC: 4.24 MIL/uL (ref 3.87–5.11)
RDW: 13 % (ref 11.5–15.5)
WBC: 7.9 10*3/uL (ref 4.0–10.5)

## 2018-03-10 LAB — COMPREHENSIVE METABOLIC PANEL
ALT: 14 U/L (ref 0–44)
AST: 19 U/L (ref 15–41)
Albumin: 4.3 g/dL (ref 3.5–5.0)
Alkaline Phosphatase: 79 U/L (ref 38–126)
Anion gap: 8 (ref 5–15)
BILIRUBIN TOTAL: 0.4 mg/dL (ref 0.3–1.2)
BUN: 11 mg/dL (ref 6–20)
CHLORIDE: 110 mmol/L (ref 98–111)
CO2: 23 mmol/L (ref 22–32)
Calcium: 9.1 mg/dL (ref 8.9–10.3)
Creatinine, Ser: 0.69 mg/dL (ref 0.44–1.00)
Glucose, Bld: 91 mg/dL (ref 70–99)
Potassium: 3.7 mmol/L (ref 3.5–5.1)
Sodium: 141 mmol/L (ref 135–145)
TOTAL PROTEIN: 7.2 g/dL (ref 6.5–8.1)

## 2018-03-10 LAB — LIPASE, BLOOD: LIPASE: 25 U/L (ref 11–51)

## 2018-03-10 MED ORDER — METHOCARBAMOL 500 MG PO TABS: 1000.0000 mg | ORAL_TABLET | Freq: Four times a day (QID) | ORAL | 0 refills | Status: DC | PRN

## 2018-03-10 MED ORDER — SULFAMETHOXAZOLE-TRIMETHOPRIM 800-160 MG PO TABS: 1.0000 | ORAL_TABLET | Freq: Two times a day (BID) | ORAL | 0 refills | Status: DC

## 2018-03-10 MED ORDER — NAPROXEN 250 MG PO TABS: 250.0000 mg | ORAL_TABLET | Freq: Two times a day (BID) | ORAL | 0 refills | Status: DC | PRN

## 2018-03-10 MED ORDER — FLUCONAZOLE 150 MG PO TABS: 150.0000 mg | ORAL_TABLET | Freq: Once | ORAL | 0 refills | Status: AC

## 2018-03-10 NOTE — ED Triage Notes (Signed)
Pt reports abd pain X2 weeks, was seen by PCP today and was told to come to ED. Currently being tx for UTI, taken first dose today. Reports R sided abd pain to flank area.

## 2018-03-10 NOTE — Discharge Instructions (Addendum)
Take the prescriptions as directed.  Apply moist heat or ice to the area(s) of discomfort, for 15 minutes at a time, several times per day for the next few days.  Do not fall asleep on a heating or ice pack.  Call your regular medical doctor on Friday to schedule a follow up appointment within the next week.  Return to the Emergency Department immediately if worsening.

## 2018-03-10 NOTE — ED Provider Notes (Signed)
[facility] EMERGENCY DEPARTMENT Provider Note   CSN: [id number] Arrival date & time: 03/10/18  1524     History   Chief Complaint Chief Complaint  Patient presents with  . Abdominal Pain    HPI Alice Santiago is a 37 y.o. female.   Abdominal Pain     Pt was seen at 1655. Per pt, c/o sudden onset and persistence of constant right sided flank and abd "pain" that began several weeks ago.  Pt describes the pain as radiating from her back to her abd. Has been associated with no other symptoms. Pt was evaluated by her PMD today and was told she "had a urine infection," and "to come to the ED because I needed a CT scan."  Denies dysuria/hematuria, no abd pain, no N/V, no diarrhea, no black or blood in stools, no CP/SOB, no fevers, no rash.    Past Medical History:  Diagnosis Date  . Anxiety   . Asthma   . Back problem   . Breast tenderness in female 04/23/2015  . Bronchitis    currently being treated for bronchititis,no fever  . Bronchitis    hx  . BV (bacterial vaginosis) 03/14/2015  . Chlamydia    history of   . Depression   . History of chlamydia 04/23/2015  . Vaginal discharge 03/14/2015  . Vaginal Pap smear, abnormal     Patient Active Problem List   Diagnosis Date Noted  . Cervical vertebral fusion 10/08/2017  . Perennial and seasonal allergic rhinitis 12/02/2016  . Allergic conjunctivitis 12/02/2016  . Moderate persistent asthma 12/02/2016  . History of chlamydia 04/23/2015  . BV (bacterial vaginosis) 03/14/2015  . S/P lumbar laminectomy 11/10/2014  . Mood disorder ([place]) 07/26/2013    Past Surgical History:  Procedure Laterality Date  . ABDOMINAL HYSTERECTOMY    . ANTERIOR CERVICAL DECOMP/DISCECTOMY FUSION  10/08/2017   Procedure: Anterior Cervical Decompression/Discectomy Fusion - Cervical five-Cervical six;  Surgeon: Eustace Moore, MD;  Location: [place];  Service: Neurosurgery;;  . CESAREAN SECTION     times 2  . LUMBAR LAMINECTOMY/DECOMPRESSION  MICRODISCECTOMY Left 11/10/2014   Procedure: LUMBAR LAMINECTOMY/DECOMPRESSION MICRODISCECTOMY LEFT LUMBAR FIVE-SACRAL ONE;  Surgeon: Eustace Moore, MD;  Location: [place] [facility];  Service: Neurosurgery;  Laterality: Left;  left  . TONSILLECTOMY    . TUBAL LIGATION       OB History    Gravida  3   Para  2   Term      Preterm      AB  1   Living  2     SAB  1   TAB      Ectopic      Multiple      Live Births               Home Medications    Prior to Admission medications   Medication Sig Start Date End Date Taking? Authorizing Provider  albuterol [facility] HFA) 108 (90 Base) MCG/ACT inhaler USE 2 PUFFS EVERY 6 HOURS AS NEEDED 09/29/17   Terald Sleeper, PA-C  ALPRAZolam Duanne Moron) 1 MG tablet TAKE 1 TABLET BY MOUTH FOUR TIMES DAILY AS NEEDED FOR ANXIETY 02/10/18   Terald Sleeper, PA-C  cetirizine (ZYRTEC) 10 MG tablet Take 1 tablet (10 mg total) by mouth daily. 09/29/17   Terald Sleeper, PA-C  DULoxetine (CYMBALTA) 60 MG capsule Take 1 capsule (60 mg total) by mouth daily. 11/17/17   Terald Sleeper, PA-C  EPINEPHrine 0.3  mg/0.3 mL IJ SOAJ injection Inject 0.3 mg into the muscle once.    [provider]  fluconazole (DIFLUCAN) 150 MG tablet Take 1 tablet (150 mg total) by mouth once for 1 dose. 03/10/18 03/10/18  Terald Sleeper, PA-C  NARCAN 4 MG/0.1ML LIQD nasal spray kit CALL 911. ADMINISTER A SINGLE SPRAY OF NARCAN IN ONE NOSTRIL, REPEAT EVERY 3 MINUTES AS NEEDED IF NO OR MINIMAL RESPONSE 11/16/17   [provider]  Olopatadine HCl (PATADAY) 0.2 % SOLN Apply 1 drop to eye 3 (three) times daily - between meals and at bedtime. 09/30/17   Terald Sleeper, PA-C  oxyCODONE-acetaminophen (PERCOCET/ROXICET) 5-325 MG tablet Take 1 tablet by mouth every 6 (six) hours as needed. 11/16/17   [provider]  sulfamethoxazole-trimethoprim (BACTRIM DS) 800-160 MG tablet Take 1 tablet by mouth 2 (two) times daily. 03/10/18   Terald Sleeper, PA-C    Family History Family  History  Problem Relation Age of Onset  . Depression Mother   . Anxiety disorder Mother   . Heart murmur Father   . Other Son        problems with bowels  . Asthma Son   . ADD / ADHD Son   . ODD Son   . Asthma Son   . Cancer Neg Hx     Social History Social History   Tobacco Use  . Smoking status: Current Every Day Smoker    Packs/day: 1.00    Years: 21.00    Pack years: 21.00    Types: Cigarettes  . Smokeless tobacco: Never Used  Substance Use Topics  . Alcohol use: Yes    Comment: occ  . Drug use: No     Allergies   Patient has no known allergies.   Review of Systems Review of Systems  Gastrointestinal: Positive for abdominal pain.  ROS: Statement: All systems negative except as marked or noted in the HPI; Constitutional: Negative for fever and chills. ; ; Eyes: Negative for eye pain, redness and discharge. ; ; ENMT: Negative for ear pain, hoarseness, nasal congestion, sinus pressure and sore throat. ; ; Cardiovascular: Negative for chest pain, palpitations, diaphoresis, dyspnea and peripheral edema. ; ; Respiratory: Negative for cough, wheezing and stridor. ; ; Gastrointestinal: +abd pain. Negative for nausea, vomiting, diarrhea, blood in stool, hematemesis, jaundice and rectal bleeding. . ; ; Genitourinary: +flank pain. Negative for dysuria and hematuria. ; ; GYN:  No pelvic pain, no vaginal bleeding, no vaginal discharge, no vulvar pain. ;; Musculoskeletal: Negative for back pain and neck pain. Negative for swelling and trauma.; ; Skin: Negative for pruritus, rash, abrasions, blisters, bruising and skin lesion.; ; Neuro: Negative for headache, lightheadedness and neck stiffness. Negative for weakness, altered level of consciousness, altered mental status, extremity weakness, paresthesias, involuntary movement, seizure and syncope.        Physical Exam Updated Vital Signs BP (!) 151/95   Pulse 96   Temp 97.7 F (36.5 C) (Oral)   Resp 20   SpO2 96%   Physical  Exam 1700: Physical examination:  Nursing notes reviewed; Vital signs and O2 SAT reviewed;  Constitutional: Well developed, Well nourished, Well hydrated, In no acute distress; Head:  Normocephalic, atraumatic; Eyes: EOMI, PERRL, No scleral icterus; ENMT: Mouth and pharynx normal, Mucous membranes moist; Neck: Supple, Full range of motion, No lymphadenopathy; Cardiovascular: Regular rate and rhythm, No gallop; Respiratory: Breath sounds clear & equal bilaterally, No wheezes.  Speaking full sentences with ease, Normal respiratory effort/excursion; Chest:  Nontender, Movement normal; Abdomen: Soft, Nontender, Nondistended, Normal bowel sounds. +TTP right lateral torso/abd. No rebound or guarding.; Genitourinary: No CVA tenderness; Spine:  No midline CS, TS, LS tenderness. +mild TTP right lumbar paraspinal muscles. No rash.;; Extremities: Peripheral pulses normal, No tenderness, No edema, No calf edema or asymmetry.; Neuro: AA&Ox3, Major CN grossly intact.  Speech clear. No gross focal motor or sensory deficits in extremities.; Skin: Color normal, Warm, Dry.   ED Treatments / Results  Labs (all labs ordered are listed, but only abnormal results are displayed)   EKG None  Radiology   Procedures Procedures (including critical care time)  Medications Ordered in ED Medications - No data to display   Initial Impression / Assessment and Plan / ED Course  I have reviewed the triage vital signs and the nursing notes.  Pertinent labs & imaging results that were available during my care of the patient were reviewed by me and considered in my medical decision making (see chart for details).  MDM Reviewed: previous chart, nursing note and vitals Reviewed previous: labs Interpretation: labs and CT scan   Results for orders placed or performed during the hospital encounter of 03/10/18  Lipase, blood  Result Value Ref Range   Lipase 25 11 - 51 U/L  Comprehensive metabolic panel  Result Value Ref  Range   Sodium 141 135 - 145 mmol/L   Potassium 3.7 3.5 - 5.1 mmol/L   Chloride 110 98 - 111 mmol/L   CO2 23 22 - 32 mmol/L   Glucose, Bld 91 70 - 99 mg/dL   BUN 11 6 - 20 mg/dL   Creatinine, Ser 0.69 0.44 - 1.00 mg/dL   Calcium 9.1 8.9 - 10.3 mg/dL   Total Protein 7.2 6.5 - 8.1 g/dL   Albumin 4.3 3.5 - 5.0 g/dL   AST 19 15 - 41 U/L   ALT 14 0 - 44 U/L   Alkaline Phosphatase 79 38 - 126 U/L   Total Bilirubin 0.4 0.3 - 1.2 mg/dL   GFR calc non Af Amer >60 >60 mL/min   GFR calc Af Amer >60 >60 mL/min   Anion gap 8 5 - 15  CBC  Result Value Ref Range   WBC 7.9 4.0 - 10.5 K/uL   RBC 4.24 3.87 - 5.11 MIL/uL   Hemoglobin 13.6 12.0 - 15.0 g/dL   HCT 40.3 36.0 - 46.0 %   MCV 95.0 78.0 - 100.0 fL   MCH 32.1 26.0 - 34.0 pg   MCHC 33.7 30.0 - 36.0 g/dL   RDW 13.0 11.5 - 15.5 %   Platelets 284 150 - 400 K/uL  Urinalysis, Routine w reflex microscopic  Result Value Ref Range   Color, Urine YELLOW YELLOW   APPearance CLOUDY (A) CLEAR   Specific Gravity, Urine 1.023 1.005 - 1.030   pH 7.0 5.0 - 8.0   Glucose, UA NEGATIVE NEGATIVE mg/dL   Hgb urine dipstick NEGATIVE NEGATIVE   Bilirubin Urine NEGATIVE NEGATIVE   Ketones, ur NEGATIVE NEGATIVE mg/dL   Protein, ur NEGATIVE NEGATIVE mg/dL   Nitrite NEGATIVE NEGATIVE   Leukocytes, UA NEGATIVE NEGATIVE   Ct Renal Stone Study Result Date: 03/10/2018 CLINICAL DATA:  Right flank pain. EXAM: CT ABDOMEN AND PELVIS WITHOUT CONTRAST TECHNIQUE: Multidetector CT imaging of the abdomen and pelvis was performed following the standard protocol without IV contrast. COMPARISON:  CT abdomen pelvis 11/08/2009 FINDINGS: LOWER CHEST: No basilar pulmonary nodules or pleural effusion. No apical pericardial effusion. HEPATOBILIARY: Normal hepatic contours and  density. No intra- or extrahepatic biliary dilatation. Normal gallbladder. PANCREAS: Normal parenchymal contours without ductal dilatation. No peripancreatic fluid collection. SPLEEN: Normal. ADRENALS/URINARY  TRACT: --Adrenal glands: Normal. --Right kidney/ureter: Single nonobstructing renal calculus measuring 1 mm. No hydronephrosis, perinephric stranding or solid renal mass. --Left kidney/ureter: No hydronephrosis, nephroureterolithiasis, perinephric stranding or solid renal mass. --Urinary bladder: Normal for degree of distention STOMACH/BOWEL: --Stomach/Duodenum: No hiatal hernia or other gastric abnormality. Normal duodenal course. --Small bowel: No dilatation or inflammation. --Colon: No focal abnormality. --Appendix: Normal. VASCULAR/LYMPHATIC: Normal course and caliber of the major abdominal vessels. No abdominal or pelvic lymphadenopathy. REPRODUCTIVE: Status post hysterectomy. No adnexal mass. MUSCULOSKELETAL. No bony spinal canal stenosis or focal osseous abnormality. OTHER: None. IMPRESSION: No acute abnormality of the abdomen or pelvis. Electronically Signed   By: Ulyses Jarred M.D.   On: 03/10/2018 19:02    1915:  Workup reassuring. No UTI on Udip; UC pending. Pt already rx and taking abx as instructed by PMD. [place] Controlled Substance Database accessed:  Pt has been receiving weekly oxycodone rx from Neurosurgeon's office since 10/08/2017. Will not rx any further narcotics. Will tx pain symptomatically at this time. Dx and testing d/w pt.  Questions answered.  Verb understanding, agreeable to d/c home with outpt f/u.      Final Clinical Impressions(s) / ED Diagnoses   Final diagnoses:  None    ED Discharge Orders    None       Francine Graven, DO 03/14/18 0158

## 2018-03-11 LAB — URINE CULTURE: ORGANISM ID, BACTERIA: NO GROWTH

## 2018-03-12 LAB — URINE CULTURE

## 2018-03-14 DIAGNOSIS — Z8742 Personal history of other diseases of the female genital tract: Secondary | ICD-10-CM | POA: Insufficient documentation

## 2018-03-14 NOTE — Progress Notes (Signed)
BP 108/75   Pulse 79   Temp 98.3 F (36.8 C) (Oral)   Ht 5' 3"  (1.6 m)   Wt 195 lb 3.2 oz (88.5 kg)   BMI 34.58 kg/m    Subjective:    Patient ID: Alice Santiago, female    DOB: 1981/03/11, 37 y.o.   MRN: [id number]  HPI: Alice Santiago is a 37 y.o. female presenting on 03/10/2018 for Pelvic Pain and Back Pain  This patient comes in because of chronic pelvic pain that is been going on for several weeks.  She reports that she does have some frequent urination.  She had episodes of this back in January and did see her gynecologist.  She has had a history of an ovarian cyst in the past.  She does complain of pelvic pain, back pain and right-sided pain.  She denies any changes to her bowel movements.   Past Medical History:  Diagnosis Date  . Anxiety   . Asthma   . Back problem   . Breast tenderness in female 04/23/2015  . Bronchitis    currently being treated for bronchititis,no fever  . Bronchitis    hx  . BV (bacterial vaginosis) 03/14/2015  . Chlamydia    history of   . Depression   . History of chlamydia 04/23/2015  . Vaginal discharge 03/14/2015  . Vaginal Pap smear, abnormal    Relevant past medical, surgical, family and social history reviewed and updated as indicated. Interim medical history since our last visit reviewed. Allergies and medications reviewed and updated. DATA REVIEWED: CHART IN EPIC  Family History reviewed for pertinent findings.  Review of Systems  Constitutional: Positive for fatigue. Negative for fever.  HENT: Negative.   Eyes: Negative.   Respiratory: Negative.   Gastrointestinal: Positive for abdominal distention and abdominal pain. Negative for blood in stool, constipation, diarrhea and vomiting.  Genitourinary: Positive for frequency, pelvic pain and urgency. Negative for menstrual problem.    Allergies as of 03/10/2018   No Known Allergies     Medication List        Accurate as of 03/10/18 11:59 PM. Always use your most recent med list.          albuterol 108 (90 Base) MCG/ACT inhaler Commonly known as:  PROAIR HFA USE 2 PUFFS EVERY 6 HOURS AS NEEDED   ALPRAZolam 1 MG tablet Commonly known as:  XANAX TAKE 1 TABLET BY MOUTH FOUR TIMES DAILY AS NEEDED FOR ANXIETY   cetirizine 10 MG tablet Commonly known as:  ZYRTEC Take 1 tablet (10 mg total) by mouth daily.   DULoxetine 60 MG capsule Commonly known as:  CYMBALTA Take 1 capsule (60 mg total) by mouth daily.   EPINEPHrine 0.3 mg/0.3 mL Soaj injection Commonly known as:  EPI-PEN Inject 0.3 mg into the muscle once.   fluconazole 150 MG tablet Commonly known as:  DIFLUCAN Take 1 tablet (150 mg total) by mouth once for 1 dose.   methocarbamol 500 MG tablet Commonly known as:  ROBAXIN Take 2 tablets (1,000 mg total) by mouth 4 (four) times daily as needed for muscle spasms (muscle spasm/pain).   naproxen 250 MG tablet Commonly known as:  NAPROSYN Take 1 tablet (250 mg total) by mouth 2 (two) times daily as needed for mild pain or moderate pain (take with food).   NARCAN 4 MG/0.1ML Liqd nasal spray kit Generic drug:  naloxone CALL 911. ADMINISTER A SINGLE SPRAY OF NARCAN IN ONE NOSTRIL, REPEAT EVERY 3 MINUTES  AS NEEDED IF NO OR MINIMAL RESPONSE   Olopatadine HCl 0.2 % Soln Commonly known as:  PATADAY Apply 1 drop to eye 3 (three) times daily - between meals and at bedtime.   oxyCODONE-acetaminophen 5-325 MG tablet Commonly known as:  PERCOCET/ROXICET Take 1 tablet by mouth every 6 (six) hours as needed.   sulfamethoxazole-trimethoprim 800-160 MG tablet Commonly known as:  BACTRIM DS Take 1 tablet by mouth 2 (two) times daily.          Objective:    BP 108/75   Pulse 79   Temp 98.3 F (36.8 C) (Oral)   Ht 5' 3"  (1.6 m)   Wt 195 lb 3.2 oz (88.5 kg)   BMI 34.58 kg/m   No Known Allergies  Wt Readings from Last 3 Encounters:  03/10/18 195 lb 3.2 oz (88.5 kg)  11/17/17 199 lb 9.6 oz (90.5 kg)  10/08/17 197 lb (89.4 kg)    Physical Exam    Constitutional: She is oriented to person, place, and time. She appears well-developed and well-nourished.  HENT:  Head: Normocephalic and atraumatic.  Eyes: Pupils are equal, round, and reactive to light. Conjunctivae and EOM are normal.  Cardiovascular: Normal rate, regular rhythm, normal heart sounds and intact distal pulses.  Pulmonary/Chest: Effort normal and breath sounds normal.  Abdominal: Soft. Bowel sounds are decreased. There is tenderness in the right lower quadrant. There is no rigidity and no rebound.    Neurological: She is alert and oriented to person, place, and time. She has normal reflexes.  Skin: Skin is warm and dry. No rash noted.  Psychiatric: She has a normal mood and affect. Her behavior is normal. Judgment and thought content normal.    Results for orders placed or performed in visit on 03/10/18  Urine Culture  Result Value Ref Range   Urine Culture, Routine Final report    Organism ID, Bacteria No growth   Microscopic Examination  Result Value Ref Range   WBC, UA 0-5 0 - 5 /hpf   RBC, UA None seen 0 - 2 /hpf   Epithelial Cells (non renal) 0-10 0 - 10 /hpf   Renal Epithel, UA None seen None seen /hpf   Mucus, UA Present Not Estab.   Bacteria, UA Few None seen/Few  Urinalysis, Complete  Result Value Ref Range   Specific Gravity, UA >1.030 (H) 1.005 - 1.030   pH, UA 5.5 5.0 - 7.5   Color, UA Yellow Yellow   Appearance Ur Clear Clear   Leukocytes, UA Negative Negative   Protein, UA 1+ (A) Negative/Trace   Glucose, UA Negative Negative   Ketones, UA Negative Negative   RBC, UA Negative Negative   Bilirubin, UA Negative Negative   Urobilinogen, Ur 0.2 0.2 - 1.0 mg/dL   Nitrite, UA Negative Negative   Microscopic Examination See below:       Assessment & Plan:   1. Abdominal pain, unspecified abdominal location - Urine Culture - Urinalysis, Complete - CMP14+EGFR - CBC with Differential/Platelet  2. Abnormal weight loss - CT Abdomen Pelvis Wo  Contrast; Future  3. Pelvic pain - CMP14+EGFR - CBC with Differential/Platelet  4. History of ovarian cyst - CT Abdomen Pelvis Wo Contrast; Future  5. Chronic fatigue - TSH   Continue all other maintenance medications as listed above.  Follow up plan: Return in about 1 week (around 03/17/2018).  Educational handout given for [place] PA-C [place] [address]  [place], [place] [address] [phone]   03/14/2018, 10:25 PM

## 2018-03-17 ENCOUNTER — Ambulatory Visit: Payer: Medicaid Other | Admitting: Physician Assistant

## 2018-05-09 ENCOUNTER — Other Ambulatory Visit: Payer: Self-pay | Admitting: Physician Assistant

## 2018-05-09 DIAGNOSIS — F39 Unspecified mood [affective] disorder: Secondary | ICD-10-CM

## 2018-05-12 NOTE — Telephone Encounter (Signed)
Last seen 03/10/18  Angel 

## 2018-05-12 NOTE — Telephone Encounter (Signed)
Has 05/20/18 appointment

## 2018-05-20 ENCOUNTER — Encounter: Payer: Self-pay | Admitting: Physician Assistant

## 2018-05-20 ENCOUNTER — Ambulatory Visit: Payer: Medicaid Other | Admitting: Physician Assistant

## 2018-05-20 VITALS — BP 126/87 | HR 86 | Temp 97.5°F | Ht 63.0 in | Wt 190.8 lb

## 2018-05-20 DIAGNOSIS — J4541 Moderate persistent asthma with (acute) exacerbation: Secondary | ICD-10-CM

## 2018-05-20 DIAGNOSIS — F411 Generalized anxiety disorder: Secondary | ICD-10-CM | POA: Diagnosis not present

## 2018-05-20 DIAGNOSIS — Z9889 Other specified postprocedural states: Secondary | ICD-10-CM | POA: Diagnosis not present

## 2018-05-20 DIAGNOSIS — F39 Unspecified mood [affective] disorder: Secondary | ICD-10-CM | POA: Diagnosis not present

## 2018-05-20 MED ORDER — DULOXETINE HCL 60 MG PO CPEP: 60.0000 mg | ORAL_CAPSULE | Freq: Every day | ORAL | 5 refills | Status: DC

## 2018-05-20 MED ORDER — ALPRAZOLAM 1 MG PO TABS: ORAL_TABLET | ORAL | 5 refills | Status: DC

## 2018-05-20 MED ORDER — ARIPIPRAZOLE 5 MG PO TABS: 5.0000 mg | ORAL_TABLET | Freq: Every day | ORAL | 1 refills | Status: DC

## 2018-05-20 MED ORDER — ALBUTEROL SULFATE HFA 108 (90 BASE) MCG/ACT IN AERS: INHALATION_SPRAY | RESPIRATORY_TRACT | 2 refills | Status: DC

## 2018-05-20 MED ORDER — EPINEPHRINE 0.3 MG/0.3ML IJ SOAJ: 0.3000 mg | Freq: Once | INTRAMUSCULAR | 0 refills | Status: AC

## 2018-05-20 MED ORDER — IBUPROFEN 800 MG PO TABS: 800.0000 mg | ORAL_TABLET | Freq: Three times a day (TID) | ORAL | 5 refills | Status: DC | PRN

## 2018-05-20 NOTE — Progress Notes (Signed)
BP 126/87   Pulse 86   Temp (!) 97.5 F (36.4 C) (Oral)   Ht 5' 3"  (1.6 m)   Wt 190 lb 12.8 oz (86.5 kg)   BMI 33.80 kg/m    Subjective:    Patient ID: Alice Santiago, female    DOB: 06-05-1981, 37 y.o.   MRN: [id number]  HPI: Alice Santiago is a 37 y.o. female presenting on 05/20/2018 for Mood disorder (6 month follow up )  This patient comes in for periodic recheck on medications and conditions including mood disorder generalized anxiety chronic back pain, asthma with severe allergies.  All of her medications are reviewed today.  We will update some changes.  She is willing to be referred to virtual behavioral health.  She does not have very good transportation at this time..   All medications are reviewed today. There are no reports of any problems with the medications. All of the medical conditions are reviewed and updated.  Lab work is reviewed and will be ordered as medically necessary. There are no new problems reported with today's visit.   Past Medical History:  Diagnosis Date  . Anxiety   . Asthma   . Back problem   . Breast tenderness in female 04/23/2015  . Bronchitis    currently being treated for bronchititis,no fever  . Bronchitis    hx  . BV (bacterial vaginosis) 03/14/2015  . Chlamydia    history of   . Depression   . History of chlamydia 04/23/2015  . Vaginal discharge 03/14/2015  . Vaginal Pap smear, abnormal    Relevant past medical, surgical, family and social history reviewed and updated as indicated. Interim medical history since our last visit reviewed. Allergies and medications reviewed and updated. DATA REVIEWED: CHART IN EPIC  Family History reviewed for pertinent findings.  Review of Systems  Constitutional: Negative.  Negative for activity change, fatigue and fever.  HENT: Negative.   Eyes: Negative.   Respiratory: Negative.  Negative for cough.   Cardiovascular: Negative.  Negative for chest pain.  Gastrointestinal: Negative.  Negative for  abdominal pain.  Endocrine: Negative.   Genitourinary: Negative.  Negative for dysuria.  Musculoskeletal: Positive for arthralgias, back pain, myalgias, neck pain and neck stiffness.  Skin: Negative.   Neurological: Negative.   Psychiatric/Behavioral: Positive for dysphoric mood and sleep disturbance. Negative for suicidal ideas. The patient is nervous/anxious and is hyperactive.     Allergies as of 05/20/2018   No Known Allergies     Medication List        Accurate as of 05/20/18 11:45 AM. Always use your most recent med list.          albuterol 108 (90 Base) MCG/ACT inhaler Commonly known as:  PROVENTIL HFA;VENTOLIN HFA USE 2 PUFFS EVERY 6 HOURS AS NEEDED   ALPRAZolam 1 MG tablet Commonly known as:  XANAX TAKE 1 TABLET BY MOUTH FOUR TIMES DAILY AS NEEDED FOR ANXIETY   ARIPiprazole 5 MG tablet Commonly known as:  ABILIFY Take 1 tablet (5 mg total) by mouth daily.   cetirizine 10 MG tablet Commonly known as:  ZYRTEC Take 1 tablet (10 mg total) by mouth daily.   DULoxetine 60 MG capsule Commonly known as:  CYMBALTA Take 1 capsule (60 mg total) by mouth daily.   EPINEPHrine 0.3 mg/0.3 mL Soaj injection Commonly known as:  EPI-PEN Inject 0.3 mg into the muscle once.   methocarbamol 500 MG tablet Commonly known as:  ROBAXIN Take 2 tablets (  1,000 mg total) by mouth 4 (four) times daily as needed for muscle spasms (muscle spasm/pain).   naproxen 250 MG tablet Commonly known as:  NAPROSYN Take 1 tablet (250 mg total) by mouth 2 (two) times daily as needed for mild pain or moderate pain (take with food).   NARCAN 4 MG/0.1ML Liqd nasal spray kit Generic drug:  naloxone CALL 911. ADMINISTER A SINGLE SPRAY OF NARCAN IN ONE NOSTRIL, REPEAT EVERY 3 MINUTES AS NEEDED IF NO OR MINIMAL RESPONSE   Olopatadine HCl 0.2 % Soln Apply 1 drop to eye 3 (three) times daily - between meals and at bedtime.          Objective:    BP 126/87   Pulse 86   Temp (!) 97.5 F (36.4 C)  (Oral)   Ht 5' 3"  (1.6 m)   Wt 190 lb 12.8 oz (86.5 kg)   BMI 33.80 kg/m   No Known Allergies  Wt Readings from Last 3 Encounters:  05/20/18 190 lb 12.8 oz (86.5 kg)  03/10/18 195 lb 3.2 oz (88.5 kg)  11/17/17 199 lb 9.6 oz (90.5 kg)    Physical Exam  Constitutional: She is oriented to person, place, and time. She appears well-developed and well-nourished.  HENT:  Head: Normocephalic and atraumatic.  Eyes: Pupils are equal, round, and reactive to light. Conjunctivae and EOM are normal.  Cardiovascular: Normal rate, regular rhythm, normal heart sounds and intact distal pulses.  Pulmonary/Chest: Effort normal and breath sounds normal.  Abdominal: Soft. Bowel sounds are normal.  Neurological: She is alert and oriented to person, place, and time. She has normal reflexes.  Skin: Skin is warm and dry. No rash noted.  Psychiatric: Her behavior is normal. Judgment and thought content normal. Her mood appears anxious. Her speech is rapid and/or pressured.        Assessment & Plan:   1. Mood disorder (HCC) - ALPRAZolam (XANAX) 1 MG tablet; TAKE 1 TABLET BY MOUTH FOUR TIMES DAILY AS NEEDED FOR ANXIETY  Dispense: 120 tablet; Refill: 5 - ARIPiprazole (ABILIFY) 5 MG tablet; Take 1 tablet (5 mg total) by mouth daily.  Dispense: 30 tablet; Refill: 1 - DULoxetine (CYMBALTA) 60 MG capsule; Take 1 capsule (60 mg total) by mouth daily.  Dispense: 30 capsule; Refill: 5 - Ambulatory referral to Psychiatry  2. GAD (generalized anxiety disorder) - ALPRAZolam (XANAX) 1 MG tablet; TAKE 1 TABLET BY MOUTH FOUR TIMES DAILY AS NEEDED FOR ANXIETY  Dispense: 120 tablet; Refill: 5 - DULoxetine (CYMBALTA) 60 MG capsule; Take 1 capsule (60 mg total) by mouth daily.  Dispense: 30 capsule; Refill: 5 - Ambulatory referral to Psychiatry  3. S/P lumbar laminectomy - ibuprofen (IBU) 800 MG tablet; Take 1 tablet (800 mg total) by mouth every 8 (eight) hours as needed.  Dispense: 90 tablet; Refill: 5  4. Moderate  persistent asthma with acute exacerbation - albuterol (PROAIR HFA) 108 (90 Base) MCG/ACT inhaler; USE 2 PUFFS EVERY 6 HOURS AS NEEDED  Dispense: 18 g; Refill: 2    Continue all other maintenance medications as listed above.  Follow up plan: Return in about 3 weeks (around 06/10/2018) for recheck.  Educational handout given for mania  Terald Sleeper PA-C [place] [address]  [place], [place] [address] [phone]   05/20/2018, 11:45 AM

## 2018-05-20 NOTE — Patient Instructions (Signed)
Mania Mania is a condition that affects people who have certain mood disorders. Mania involves episodes of emotional highs that include having very high energy, racing thoughts, very high self-esteem, and decreased ability to concentrate. These episodes are very intense and can last longer than a week. In some cases, episodes of mania can be so strong that people with this condition need to be hospitalized for their safety and the safety of people around them. What are the causes? The cause of this condition is not known. What increases the risk? You are more likely to develop mania if you have a mood disorder, especially bipolar disorder. If you have a mood disorder, the following factors may increase your risk of developing mania:  Not getting enough sleep.  Using substances such as tobacco, caffeine, or illegal drugs.  Certain prescription medicines, such as antidepressants or antibiotics.  Stress or emotional events.  Certain seasons. Mania is more common in spring and summer.  The period of time after having a baby (postpartum period).  What are the signs or symptoms? Symptoms of this condition include:  Periods of having very high energy that may last longer than a week. In some cases, you have so much energy that you may become unsafe and need to go to the hospital.  Very high self-esteem or self-confidence.  Decreased need for sleep.  Being unusually talkative, or feeling a need to keep talking. Speech may be very fast. It may seem like you cannot stop talking.  Racing thoughts or constant talking, with quick shifts between topics that may or may not be related (flight of ideas).  Decreased ability to focus or concentrate.  Increased purposeful activity, such as work, study, or social activity.  Increased nonproductive activity. This could be pacing, squirming and fidgeting, or finger and toe tapping.  Impulsive behavior and poor judgment. This may result in high-risk  activities, such as having unprotected sex or spending a lot of money.  Having false beliefs (delusions) or seeing, hearing, or feeling things that do not exist (hallucinations).  How is this diagnosed? This condition may be diagnosed based on:  Your symptoms and medical history.  A physical exam. Your health care provider will check for physical conditions that may be causing your symptoms.  A mental health evaluation. You may be referred to a mental health provider who specializes in diagnosing and treating mood disorders.  How is this treated? This condition may be treated with:  Medicines, such as mood stabilizers.  Talk therapy (psychotherapy) with a mental health provider.  A procedure to change the brain chemicals that send messages between brain cells (neurotransmitters). This procedure, called electroconvulsive therapy (ECT), applies short electrical pulses to the brain through the scalp. This may be used in cases of severe mania when other treatments have not helped.  Follow these instructions at home:  Take over-the-counter and prescription medicines only as told by your health care provider.  Try to go to sleep and wake up at the same time every day.  Make and follow a routine for daily meal times.  Ask for support from family, friends, or relatives to make sure you stay on track with your treatment.  Keep all follow-up visits as told by your health care provider. This is important. Contact a health care provider if:  You have concerns about your treatment.  You have side effects from your prescription medicines.  Your symptoms do not improve or they get worse.  Your mania may be putting your   health, or others' health, at risk. Get help right away if:  You think about hurting yourself or you try to hurt yourself.  You think about suicide. If you ever feel like you may hurt yourself or others, or have thoughts about taking your own life, get help right away.  You can go to your nearest emergency department or call:  Your local emergency services (911 in the [place]  A suicide crisis helpline, such as the National Suicide Prevention Lifeline at 1-800-273-8255. This is open 24 hours a day.  Summary  Mania involves episodes of emotional highs that include having very high energy, racing thoughts, very high self-esteem, and decreased ability to concentrate.  Episodes of mania are very intense and can last longer than a week.  Treatment for mania may include medicines and talk therapy (psychotherapy). This information is not intended to replace advice given to you by your health care provider. Make sure you discuss any questions you have with your health care provider. Document Released: 12/19/2016 Document Revised: 12/19/2016 Document Reviewed: 12/19/2016 Elsevier Interactive Patient Education  2018 Elsevier Inc.  

## 2018-06-02 ENCOUNTER — Ambulatory Visit (INDEPENDENT_AMBULATORY_CARE_PROVIDER_SITE_OTHER): Payer: Medicaid Other

## 2018-06-02 DIAGNOSIS — Z23 Encounter for immunization: Secondary | ICD-10-CM

## 2018-06-02 NOTE — Patient Instructions (Signed)
Preventing Influenza, Adult Influenza, more commonly known as "the flu," is a viral infection that mainly affects the respiratory tract. The respiratory tract includes structures that help you breathe, such as the lungs, nose, and throat. The flu causes many common cold symptoms, as well as a high fever and body aches. The flu spreads easily from person to person (is contagious). The flu is most common from December through March. This is called flu season.You can catch the flu virus by:  Breathing in droplets from an infected person's cough or sneeze.  Touching something that was recently contaminated with the virus and then touching your mouth, nose, or eyes.  What can I do to lower my risk? You can decrease your risk of getting the flu by:  Getting a flu shot (influenza vaccination) every year. This is the best way to prevent the flu. A flu shot is recommended for everyone age 6 months and older. ? It is best to get a flu shot in the fall, as soon as it is available. Getting a flu shot during winter or spring instead is still a good idea. Flu season can last into early spring. ? Preventing the flu through vaccination requires getting a new flu shot every year. This is because the flu virus changes slightly (mutates) from one year to the next. Even if a flu shot does not completely protect you from all flu virus mutations, it can reduce the severity of your illness and prevent dangerous complications of the flu. ? If you are pregnant, you can and should get a flu shot. ? If you have had a reaction to the shot in the past or if you are allergic to eggs, check with your health care provider before getting a flu shot. ? Sometimes the vaccine is available as a nasal spray. In some years, the nasal spray has not been as effective against the flu virus. Check with your health care provider if you have questions about this.  Practicing good health habits. This is especially important during flu  season. ? Avoid contact with people who are sick with flu or cold symptoms. ? Wash your hands with soap and water often. If soap and water are not available, use hand sanitizer. ? Avoid touching your hands to your face, especially when you have not washed your hands recently. ? Use a disinfectant to clean surfaces at home and at work that may be contaminated with the flu virus. ? Keep your body's disease-fighting system (immune system) in good shape by eating a healthy diet, drinking plenty of fluids, getting enough sleep, and exercising regularly.  If you do get the flu, avoid spreading it to others by:  Staying home until your symptoms have been gone for at least one day.  Covering your mouth and nose with your elbow when you cough or sneeze.  Avoiding close contact with others, especially babies and elderly people.  Why are these changes important? Getting a flu shot and practicing good health habits protects you as well as other people. If you get the flu, your friends, family, and co-workers are also at risk of getting it, because it spreads so easily to others. Each year, about 2 out of every 10 people get the flu. Having the flu can lead to complications, such as pneumonia, ear infection, and sinus infection. The flu also can be deadly, especially for babies, people older than age 65, and people who have serious long-term diseases. How is this treated? Most people recover   from the flu by resting at home and drinking plenty of fluids. However, a prescription antiviral medicine may reduce your flu symptoms and may make your flu go away sooner. This medicine must be started within a few days of getting flu symptoms. You can talk with your health care provider about whether you need an antiviral medicine. Antiviral medicine may be prescribed for people who are at risk for more serious flu symptoms. This includes people who:  Are older than age 65.  Are pregnant.  Have a condition that  makes the flu worse or more dangerous.  Where to find more information:  Centers for Disease Control and Prevention: www.cdc.gov/flu/index.htm  Flu.gov: www.flu.gov/prevention-vaccination  American Academy of Family Physicians: familydoctor.org/familydoctor/en/kids/vaccines/preventing-the-flu.html Contact a health care provider if:  You have influenza and you develop new symptoms.  You have: ? Chest pain. ? Diarrhea. ? A fever.  Your cough gets worse, or you produce more mucus. Summary  The best way to prevent the flu is to get a flu shot every year in the fall.  Even if you get the flu after you have received the yearly vaccine, your flu may be milder and go away sooner because of your flu shot.  If you get the flu, antiviral medicines that are started with a few days of symptoms may reduce your flu symptoms and may make your flu go away sooner.  You can also help prevent the flu by practicing good health habits. This information is not intended to replace advice given to you by your health care provider. Make sure you discuss any questions you have with your health care provider. Document Released: 09/09/2015 Document Revised: 05/03/2016 Document Reviewed: 05/03/2016 Elsevier Interactive Patient Education  2018 Elsevier Inc.  

## 2018-06-02 NOTE — Progress Notes (Signed)
Flu vaccine given to right deltoid, patient tolerated well

## 2018-06-09 ENCOUNTER — Telehealth: Payer: Self-pay | Admitting: Physician Assistant

## 2018-06-09 NOTE — Telephone Encounter (Signed)
noted 

## 2018-06-17 ENCOUNTER — Ambulatory Visit: Payer: Medicaid Other | Admitting: Physician Assistant

## 2018-06-21 ENCOUNTER — Ambulatory Visit: Payer: Medicaid Other | Admitting: Physician Assistant

## 2018-06-24 ENCOUNTER — Telehealth: Payer: Self-pay

## 2018-06-24 NOTE — Telephone Encounter (Signed)
[facility] - Left Msg 

## 2018-06-25 ENCOUNTER — Telehealth: Payer: Self-pay

## 2018-06-25 NOTE — Telephone Encounter (Signed)
Patient declined [facility] services 

## 2018-06-28 ENCOUNTER — Ambulatory Visit: Payer: Medicaid Other | Admitting: Physician Assistant

## 2018-07-16 ENCOUNTER — Telehealth: Payer: Self-pay

## 2018-07-16 NOTE — Telephone Encounter (Signed)
Needs a referral back to my pain management Ginger Organ

## 2018-07-18 ENCOUNTER — Other Ambulatory Visit: Payer: Self-pay | Admitting: Physician Assistant

## 2018-07-18 NOTE — Telephone Encounter (Signed)
What practice is she at?

## 2018-07-19 NOTE — Telephone Encounter (Signed)
Left message for patient to call .    Is it  [facility] in [place]?

## 2018-09-08 ENCOUNTER — Other Ambulatory Visit: Payer: Self-pay | Admitting: Physician Assistant

## 2018-09-08 DIAGNOSIS — F39 Unspecified mood [affective] disorder: Secondary | ICD-10-CM

## 2018-11-12 ENCOUNTER — Encounter: Payer: Self-pay | Admitting: Physician Assistant

## 2018-11-12 ENCOUNTER — Ambulatory Visit (INDEPENDENT_AMBULATORY_CARE_PROVIDER_SITE_OTHER): Payer: Medicaid Other | Admitting: Physician Assistant

## 2018-11-12 VITALS — BP 129/82 | HR 106 | Temp 97.9°F | Ht 63.0 in | Wt 194.4 lb

## 2018-11-12 DIAGNOSIS — F39 Unspecified mood [affective] disorder: Secondary | ICD-10-CM | POA: Diagnosis not present

## 2018-11-12 DIAGNOSIS — F411 Generalized anxiety disorder: Secondary | ICD-10-CM | POA: Diagnosis not present

## 2018-11-12 MED ORDER — BUSPIRONE HCL 10 MG PO TABS: 10.0000 mg | ORAL_TABLET | Freq: Three times a day (TID) | ORAL | 2 refills | Status: DC

## 2018-11-12 MED ORDER — ALPRAZOLAM 1 MG PO TABS: ORAL_TABLET | ORAL | 2 refills | Status: DC

## 2018-11-14 NOTE — Progress Notes (Signed)
BP 129/82   Pulse (!) 106   Temp 97.9 F (36.6 C) (Oral)   Ht 5\' 3"  (1.6 m)   Wt 194 lb 6.4 oz (88.2 kg)   BMI 34.44 kg/m    Subjective:    Patient ID: Alice Santiago, female    DOB: [birth year], 38 y.o.   MRN: [id number]  HPI: Alice M Wieber is a 38 y.o. female presenting on 11/12/2018 for Anxiety (6 month )  This patient comes in for periodic recheck on medications and conditions including mood disorder and anxiety  Depression screen [facility] 2/9 11/12/2018 05/20/2018 03/10/2018 09/29/2017 09/29/2017  Decreased Interest 2 2 2 2 2   Down, Depressed, Hopeless 3 3 2 3 3   PHQ - 2 Score 5 5 4 5 5   Altered sleeping 3 2 2 2 2   Tired, decreased energy 3 3 2 3 3   Change in appetite 3 3 3 3 3   Feeling bad or failure about yourself  3 3 1 3 3   Trouble concentrating 3 3 3 3 3   Moving slowly or fidgety/restless 0 3 2 2 2   Suicidal thoughts 0 1 0 0 0  PHQ-9 Score 20 23 17 21 21    .   All medications are reviewed today. There are no reports of any problems with the medications. All of the medical conditions are reviewed and updated.  Lab work is reviewed and will be ordered as medically necessary. There are no new problems reported with today's visit.   Past Medical History:  Diagnosis Date  . Anxiety   . Asthma   . Back problem   . Breast tenderness in female 04/23/2015  . Bronchitis    currently being treated for bronchititis,no fever  . Bronchitis    hx  . BV (bacterial vaginosis) 03/14/2015  . Chlamydia    history of   . Depression   . History of chlamydia 04/23/2015  . Vaginal discharge 03/14/2015  . Vaginal Pap smear, abnormal    Relevant past medical, surgical, family and social history reviewed and updated as indicated. Interim medical history since our last visit reviewed. Allergies and medications reviewed and updated. DATA REVIEWED: CHART IN EPIC  Family History reviewed for pertinent findings.  Review of Systems  Constitutional: Negative.   HENT: Negative.   Eyes: Negative.    Respiratory: Negative.   Gastrointestinal: Negative.   Genitourinary: Negative.   Psychiatric/Behavioral: Positive for decreased concentration and dysphoric mood. The patient is nervous/anxious.     Allergies as of 11/12/2018   No Known Allergies     Medication List       Accurate as of November 12, 2018 11:59 PM. Always use your most recent med list.        albuterol 108 (90 Base) MCG/ACT inhaler Commonly known as:  ProAir HFA USE 2 PUFFS EVERY 6 HOURS AS NEEDED   ALPRAZolam 1 MG tablet Commonly known as:  XANAX TAKE 1 TABLET BY MOUTH FOUR TIMES DAILY AS NEEDED FOR ANXIETY. Decrease 1/2 tab daily   ARIPiprazole 5 MG tablet Commonly known as:  ABILIFY TAKE 1 TABLET BY MOUTH DAILY   busPIRone 10 MG tablet Commonly known as:  BUSPAR Take 1 tablet (10 mg total) by mouth 3 (three) times daily.   cetirizine 10 MG tablet Commonly known as:  ZYRTEC Take 1 tablet (10 mg total) by mouth daily.   DULoxetine 60 MG capsule Commonly known as:  CYMBALTA Take 1 capsule (60 mg total) by mouth daily.   EPINEPHrine  0.3 mg/0.3 mL Soaj injection Commonly known as:  EPI-PEN Inject one dose intramuscularly for allergic reaction. May repeat one dose if needed after 5-15 minutes. Proceed to the ER   ibuprofen 800 MG tablet Commonly known as:  IBU Take 1 tablet (800 mg total) by mouth every 8 (eight) hours as needed.   Olopatadine HCl 0.2 % Soln Commonly known as:  Pataday Apply 1 drop to eye 3 (three) times daily - between meals and at bedtime.          Objective:    BP 129/82   Pulse (!) 106   Temp 97.9 F (36.6 C) (Oral)   Ht 5\' 3"  (1.6 m)   Wt 194 lb 6.4 oz (88.2 kg)   BMI 34.44 kg/m   No Known Allergies  Wt Readings from Last 3 Encounters:  11/12/18 194 lb 6.4 oz (88.2 kg)  05/20/18 190 lb 12.8 oz (86.5 kg)  03/10/18 195 lb 3.2 oz (88.5 kg)    Physical Exam Constitutional:      Appearance: She is well-developed.  HENT:     Head: Normocephalic and atraumatic.    Eyes:     Conjunctiva/sclera: Conjunctivae normal.     Pupils: Pupils are equal, round, and reactive to light.  Cardiovascular:     Rate and Rhythm: Normal rate and regular rhythm.     Heart sounds: Normal heart sounds.  Pulmonary:     Effort: Pulmonary effort is normal.     Breath sounds: Normal breath sounds.  Abdominal:     General: Bowel sounds are normal.     Palpations: Abdomen is soft.  Skin:    General: Skin is warm and dry.     Findings: No rash.  Neurological:     Mental Status: She is alert and oriented to person, place, and time.     Deep Tendon Reflexes: Reflexes are normal and symmetric.  Psychiatric:        Behavior: Behavior normal.        Thought Content: Thought content normal.        Judgment: Judgment normal.         Assessment & Plan:   1. GAD (generalized anxiety disorder) - ALPRAZolam (XANAX) 1 MG tablet; TAKE 1 TABLET BY MOUTH FOUR TIMES DAILY AS NEEDED FOR ANXIETY. Decrease 1/2 tab daily  Dispense: 115 tablet; Refill: 2 - busPIRone (BUSPAR) 10 MG tablet; Take 1 tablet (10 mg total) by mouth 3 (three) times daily.  Dispense: 90 tablet; Refill: 2  2. Mood disorder (HCC) - ALPRAZolam (XANAX) 1 MG tablet; TAKE 1 TABLET BY MOUTH FOUR TIMES DAILY AS NEEDED FOR ANXIETY. Decrease 1/2 tab daily  Dispense: 115 tablet; Refill: 2 Continue  cymbalta 60 mg, consider increase to 120 mg Continue abilify 5 ng one daily  Continue all other maintenance medications as listed above.  Follow up plan: Return in about 3 months (around 02/12/2019) for recheck.  Educational handout given for survey  Remus Loffler PA-C [facility] [address]  [place], [place] [address] [phone]   11/14/2018, 10:52 PM

## 2018-11-22 ENCOUNTER — Other Ambulatory Visit: Payer: Self-pay | Admitting: Physician Assistant

## 2018-11-22 DIAGNOSIS — F39 Unspecified mood [affective] disorder: Secondary | ICD-10-CM

## 2019-01-17 ENCOUNTER — Other Ambulatory Visit: Payer: Self-pay | Admitting: Physician Assistant

## 2019-01-17 DIAGNOSIS — F411 Generalized anxiety disorder: Secondary | ICD-10-CM

## 2019-01-17 DIAGNOSIS — F39 Unspecified mood [affective] disorder: Secondary | ICD-10-CM

## 2019-02-13 ENCOUNTER — Other Ambulatory Visit: Payer: Self-pay | Admitting: Physician Assistant

## 2019-02-13 DIAGNOSIS — F411 Generalized anxiety disorder: Secondary | ICD-10-CM

## 2019-02-13 DIAGNOSIS — F39 Unspecified mood [affective] disorder: Secondary | ICD-10-CM

## 2019-02-14 ENCOUNTER — Ambulatory Visit (INDEPENDENT_AMBULATORY_CARE_PROVIDER_SITE_OTHER): Payer: Medicaid Other | Admitting: Physician Assistant

## 2019-02-14 ENCOUNTER — Other Ambulatory Visit: Payer: Self-pay

## 2019-02-14 ENCOUNTER — Telehealth: Payer: Self-pay | Admitting: Physician Assistant

## 2019-02-14 DIAGNOSIS — F39 Unspecified mood [affective] disorder: Secondary | ICD-10-CM | POA: Diagnosis not present

## 2019-02-14 DIAGNOSIS — F411 Generalized anxiety disorder: Secondary | ICD-10-CM | POA: Diagnosis not present

## 2019-02-14 MED ORDER — ALPRAZOLAM 1 MG PO TABS: ORAL_TABLET | ORAL | 2 refills | Status: DC

## 2019-02-14 NOTE — Telephone Encounter (Signed)
Has appt today 6/8 at 10:25 with Particia Nearing, PA-C

## 2019-02-17 ENCOUNTER — Encounter: Payer: Self-pay | Admitting: Physician Assistant

## 2019-02-17 NOTE — Progress Notes (Signed)
Telephone visit  Subjective: CC: Mood disorder and anxiety PCP: Terald Sleeper, PA-C [id number] Alice Santiago is a 38 y.o. female calls for telephone consult today. Patient provides verbal consent for consult held via phone.  Patient is identified with 2 separate identifiers.  At this time the entire area is on COVID-19 social distancing and stay home orders are in place.  Patient is of higher risk and therefore we are performing this by a virtual method.  Location of patient: Home Location of provider: HOME Others present for call: No  This patient is having a recheck on her chronic conditions of mood disorder and anxiety.  All of her medications are reviewed.  We are going to slowly work on lowering her Xanax.  Her LME currently is 7.5 there are no red flags in the P DMP review we will put in another referral for virtual behavioral health.  She has no transportation and is not able to get to counseling.   ROS: Per HPI  No Known Allergies Past Medical History:  Diagnosis Date  . Anxiety   . Asthma   . Back problem   . Breast tenderness in female 04/23/2015  . Bronchitis    currently being treated for bronchititis,no fever  . Bronchitis    hx  . BV (bacterial vaginosis) 03/14/2015  . Chlamydia    history of   . Depression   . History of chlamydia 04/23/2015  . Vaginal discharge 03/14/2015  . Vaginal Pap smear, abnormal     Current Outpatient Medications:  .  albuterol (PROAIR HFA) 108 (90 Base) MCG/ACT inhaler, USE 2 PUFFS EVERY 6 HOURS AS NEEDED, Disp: 18 g, Rfl: 2 .  ALPRAZolam (XANAX) 1 MG tablet, TAKE 1 TABLET BY MOUTH FOUR TIMES DAILY AS NEEDED FOR ANXIETY - DECREASE ONE-HALF TABLET DAILY. Must make appointment, Disp: 115 tablet, Rfl: 2 .  ARIPiprazole (ABILIFY) 5 MG tablet, TAKE 1 TABLET BY MOUTH DAILY, Disp: 30 tablet, Rfl: 2 .  busPIRone (BUSPAR) 10 MG tablet, Take 1 tablet (10 mg total) by mouth 3 (three) times daily., Disp: 90 tablet, Rfl: 2 .  cetirizine (ZYRTEC) 10 MG  tablet, Take 1 tablet (10 mg total) by mouth daily., Disp: 30 tablet, Rfl: 11 .  DULoxetine (CYMBALTA) 60 MG capsule, TAKE ONE CAPSULE BY MOUTH DAILY, Disp: 30 capsule, Rfl: 3 .  EPINEPHrine 0.3 mg/0.3 mL IJ SOAJ injection, Inject one dose intramuscularly for allergic reaction. May repeat one dose if needed after 5-15 minutes. Proceed to the ER, Disp: , Rfl: 0 .  ibuprofen (IBU) 800 MG tablet, Take 1 tablet (800 mg total) by mouth every 8 (eight) hours as needed., Disp: 90 tablet, Rfl: 5 .  Olopatadine HCl (PATADAY) 0.2 % SOLN, Apply 1 drop to eye 3 (three) times daily - between meals and at bedtime., Disp: 2.5 mL, Rfl: 11  Assessment/ Plan: 38 y.o. female   1. Mood disorder (HCC) - ALPRAZolam (XANAX) 1 MG tablet; TAKE 1 TABLET BY MOUTH FOUR TIMES DAILY AS NEEDED FOR ANXIETY - DECREASE ONE-HALF TABLET DAILY. Must make appointment  Dispense: 115 tablet; Refill: 2  2. GAD (generalized anxiety disorder) - ALPRAZolam (XANAX) 1 MG tablet; TAKE 1 TABLET BY MOUTH FOUR TIMES DAILY AS NEEDED FOR ANXIETY - DECREASE ONE-HALF TABLET DAILY. Must make appointment  Dispense: 115 tablet; Refill: 2   Start time: 10:36AM End time: 10:45 AM  Meds ordered this encounter  Medications  . ALPRAZolam (XANAX) 1 MG tablet    Sig: TAKE  1 TABLET BY MOUTH FOUR TIMES DAILY AS NEEDED FOR ANXIETY - DECREASE ONE-HALF TABLET DAILY. Must make appointment    Dispense:  115 tablet    Refill:  2    This prescription was filled on 01/24/2019. Any refills authorized will be placed on file.    Order Specific Question:   Supervising Provider    Answer:   Raliegh IpGOTTSCHALK, ASHLY M [[id number]]    Prudy FeelerAngel Reginald Weida PA-C [facility] [phone]

## 2019-03-15 ENCOUNTER — Other Ambulatory Visit: Payer: Self-pay | Admitting: Physician Assistant

## 2019-03-15 DIAGNOSIS — F39 Unspecified mood [affective] disorder: Secondary | ICD-10-CM

## 2019-05-18 ENCOUNTER — Encounter: Payer: Self-pay | Admitting: Physician Assistant

## 2019-05-18 ENCOUNTER — Ambulatory Visit (INDEPENDENT_AMBULATORY_CARE_PROVIDER_SITE_OTHER): Payer: Medicaid Other | Admitting: Physician Assistant

## 2019-05-18 DIAGNOSIS — F411 Generalized anxiety disorder: Secondary | ICD-10-CM | POA: Diagnosis not present

## 2019-05-18 DIAGNOSIS — F39 Unspecified mood [affective] disorder: Secondary | ICD-10-CM

## 2019-05-18 DIAGNOSIS — N393 Stress incontinence (female) (male): Secondary | ICD-10-CM | POA: Diagnosis not present

## 2019-05-18 MED ORDER — ALPRAZOLAM 1 MG PO TABS: ORAL_TABLET | ORAL | 2 refills | Status: DC

## 2019-05-18 MED ORDER — ARIPIPRAZOLE 5 MG PO TABS: 5.0000 mg | ORAL_TABLET | Freq: Every day | ORAL | 11 refills | Status: DC

## 2019-05-18 MED ORDER — BUSPIRONE HCL 10 MG PO TABS: 10.0000 mg | ORAL_TABLET | Freq: Three times a day (TID) | ORAL | 2 refills | Status: DC

## 2019-05-18 MED ORDER — DULOXETINE HCL 60 MG PO CPEP: 60.0000 mg | ORAL_CAPSULE | Freq: Every day | ORAL | 5 refills | Status: DC

## 2019-05-18 NOTE — Progress Notes (Signed)
Telephone visit  Subjective: ZO:[id number]:recheck meds, bladder leak PCP: Remus LofflerJones, Kenora Spayd S, PA-C WUJ:[id number]:Alice Santiago is a 38 y.o. female calls for telephone consult today. Patient provides verbal consent for consult held via phone.  Patient is identified with 2 separate identifiers.  At this time the entire area is on COVID-19 social distancing and stay home orders are in place.  Patient is of higher risk and therefore we are performing this by a virtual method.  Location of patient: home Location of provider: HOME Others present for call: no  This patient reports that she has been having consistent bladder leakage every time she coughs or lifts or strains.  It has been going on for years.  But it is very persistent now.  She denies any difficulties with infection.  She did have a partial hysterectomy related to menorrhagia.  She does not remember ever having any abnormal Pap smears in the past.  We will have her come in in 3 months to have a complete female exam with us.  But in the meantime we will make a urology referral.  ANXIETY ASSESSMENT Cause of anxiety: Generalized anxiety disorder This patient returns for a  month recheck on narcotic use for the above named condition(s)  Current medications-alprazolam 1 mg 1 tablet 3 times a day as needed for anxiety. BuSpar 10 mg 3 times a day Duloxetine 60 mg 1 daily Abilify 5 mg 1 daily Other medications tried: Multiple SSRIs Medication side effects-no Any concerns-no Any change in general medical condition-no Effectiveness of current meds-good PMP AWARE website reviewed: Yes Any suspicious activity on PMP Aware: No LME daily dose: 8  Contract on file 05/24/2018 Last UDS will perform at her next physical  History of overdose or risk of abuse no    ROS: Per HPI  No Known Allergies Past Medical History:  Diagnosis Date  . Anxiety   . Asthma   . Back problem   . Breast tenderness in female 04/23/2015  . Bronchitis    currently being  treated for bronchititis,no fever  . Bronchitis    hx  . BV (bacterial vaginosis) 03/14/2015  . Chlamydia    history of   . Depression   . History of chlamydia 04/23/2015  . Vaginal discharge 03/14/2015  . Vaginal Pap smear, abnormal     Current Outpatient Medications:  .  albuterol (PROAIR HFA) 108 (90 Base) MCG/ACT inhaler, USE 2 PUFFS EVERY 6 HOURS AS NEEDED, Disp: 18 g, Rfl: 2 .  ALPRAZolam (XANAX) 1 MG tablet, TAKE 1 TABLET BY MOUTH FOUR TIMES DAILY AS NEEDED FOR ANXIETY - DECREASE ONE-HALF TABLET DAILY. Must make appointment, Disp: 115 tablet, Rfl: 2 .  ARIPiprazole (ABILIFY) 5 MG tablet, Take 1 tablet (5 mg total) by mouth daily., Disp: 30 tablet, Rfl: 11 .  busPIRone (BUSPAR) 10 MG tablet, Take 1 tablet (10 mg total) by mouth 3 (three) times daily., Disp: 90 tablet, Rfl: 2 .  cetirizine (ZYRTEC) 10 MG tablet, Take 1 tablet (10 mg total) by mouth daily., Disp: 30 tablet, Rfl: 11 .  DULoxetine (CYMBALTA) 60 MG capsule, Take 1 capsule (60 mg total) by mouth daily., Disp: 30 capsule, Rfl: 5 .  EPINEPHrine 0.3 mg/0.3 mL IJ SOAJ injection, Inject one dose intramuscularly for allergic reaction. May repeat one dose if needed after 5-15 minutes. Proceed to the ER, Disp: , Rfl: 0 .  ibuprofen (IBU) 800 MG tablet, Take 1 tablet (800 mg total) by mouth every 8 (eight) hours as needed.,  Disp: 90 tablet, Rfl: 5 .  Olopatadine HCl (PATADAY) 0.2 % SOLN, Apply 1 drop to eye 3 (three) times daily - between meals and at bedtime., Disp: 2.5 mL, Rfl: 11  Assessment/ Plan: 38 y.o. female   1. Mood disorder (HCC) - ALPRAZolam (XANAX) 1 MG tablet; TAKE 1 TABLET BY MOUTH FOUR TIMES DAILY AS NEEDED FOR ANXIETY - DECREASE ONE-HALF TABLET DAILY. Must make appointment  Dispense: 115 tablet; Refill: 2 - ARIPiprazole (ABILIFY) 5 MG tablet; Take 1 tablet (5 mg total) by mouth daily.  Dispense: 30 tablet; Refill: 11 - DULoxetine (CYMBALTA) 60 MG capsule; Take 1 capsule (60 mg total) by mouth daily.  Dispense: 30  capsule; Refill: 5  2. GAD (generalized anxiety disorder) - ALPRAZolam (XANAX) 1 MG tablet; TAKE 1 TABLET BY MOUTH FOUR TIMES DAILY AS NEEDED FOR ANXIETY - DECREASE ONE-HALF TABLET DAILY. Must make appointment  Dispense: 115 tablet; Refill: 2 - busPIRone (BUSPAR) 10 MG tablet; Take 1 tablet (10 mg total) by mouth 3 (three) times daily.  Dispense: 90 tablet; Refill: 2 - DULoxetine (CYMBALTA) 60 MG capsule; Take 1 capsule (60 mg total) by mouth daily.  Dispense: 30 capsule; Refill: 5  3. Stress incontinence - Ambulatory referral to Urology   Return in about 3 months (around 08/17/2019) for well exam.  Continue all other maintenance medications as listed above.  Start time: 11:05 AM End time: 11:17 AM  Meds ordered this encounter  Medications  . ALPRAZolam (XANAX) 1 MG tablet    Sig: TAKE 1 TABLET BY MOUTH FOUR TIMES DAILY AS NEEDED FOR ANXIETY - DECREASE ONE-HALF TABLET DAILY. Must make appointment    Dispense:  115 tablet    Refill:  2    This prescription was filled on 01/24/2019. Any refills authorized will be placed on file.    Order Specific Question:   Supervising Provider    Answer:   Janora Norlander [[id number]]  . busPIRone (BUSPAR) 10 MG tablet    Sig: Take 1 tablet (10 mg total) by mouth 3 (three) times daily.    Dispense:  90 tablet    Refill:  2    Place on file until patient calls    Order Specific Question:   Supervising Provider    Answer:   Janora Norlander [[id number]]  . ARIPiprazole (ABILIFY) 5 MG tablet    Sig: Take 1 tablet (5 mg total) by mouth daily.    Dispense:  30 tablet    Refill:  11    This prescription was filled on 02/23/2019. Any refills authorized will be placed on file.    Order Specific Question:   Supervising Provider    Answer:   Janora Norlander [[id number]]  . DULoxetine (CYMBALTA) 60 MG capsule    Sig: Take 1 capsule (60 mg total) by mouth daily.    Dispense:  30 capsule    Refill:  5    This prescription was filled on 12/28/2018. Any  refills authorized will be placed on file.    Order Specific Question:   Supervising Provider    Answer:   Janora Norlander [[id number]]    Particia Nearing PA-C [place] [phone]

## 2019-05-30 ENCOUNTER — Other Ambulatory Visit: Payer: Self-pay

## 2019-05-30 ENCOUNTER — Encounter: Payer: Self-pay | Admitting: Family Medicine

## 2019-05-30 ENCOUNTER — Ambulatory Visit (INDEPENDENT_AMBULATORY_CARE_PROVIDER_SITE_OTHER): Payer: Medicaid Other | Admitting: Family Medicine

## 2019-05-30 DIAGNOSIS — L03317 Cellulitis of buttock: Secondary | ICD-10-CM

## 2019-05-30 DIAGNOSIS — L0231 Cutaneous abscess of buttock: Secondary | ICD-10-CM | POA: Diagnosis not present

## 2019-05-30 MED ORDER — SULFAMETHOXAZOLE-TRIMETHOPRIM 800-160 MG PO TABS: 1.0000 | ORAL_TABLET | Freq: Two times a day (BID) | ORAL | 0 refills | Status: AC

## 2019-05-30 NOTE — Progress Notes (Signed)
Virtual Visit via telephone Note Due to COVID-19 pandemic this visit was conducted virtually. This visit type was conducted due to national recommendations for restrictions regarding the COVID-19 Pandemic (e.g. social distancing, sheltering in place) in an effort to limit this patient's exposure and mitigate transmission in our community. All issues noted in this document were discussed and addressed.  A physical exam was not performed with this format.   I connected with Alice M Kaas on 05/30/19 at 1500 by telephone and verified that I am speaking with the correct person using two identifiers. Alice ISZABELLA TRULL is currently located at home and family is currently with them during visit. The provider, Kari Baars, FNP is located in their office at time of visit.  I discussed the limitations, risks, security and privacy concerns of performing an evaluation and management service by telephone and the availability of in person appointments. I also discussed with the patient that there may be a patient responsible charge related to this service. The patient expressed understanding and agreed to proceed.  Subjective:  Patient ID: Alice M Schreifels, female    DOB: [birth year], 38 y.o.   MRN: [id number]  Chief Complaint:  Abscess   HPI: Alice Santiago is a 38 y.o. female presenting on 05/30/2019 for Abscess   Pt reports an abscess to her left lower buttock. States this started several days ago and is not getting better. States it is hard, swollen, red, and very tender to touch. States she has been soaking with warm water to try to get it to drain and it will not drain. No fever, chills, weakness, or confusion. No known wound or bite.   Abscess This is a new problem. The current episode started 1 to 4 weeks ago. The problem occurs constantly. The problem has been gradually worsening. Associated symptoms include a rash. Pertinent negatives include no abdominal pain, anorexia, arthralgias, change in bowel  habit, chest pain, chills, congestion, coughing, diaphoresis, fatigue, fever, headaches, joint swelling, myalgias, nausea, neck pain, numbness, sore throat, swollen glands, urinary symptoms, vertigo, visual change, vomiting or weakness. The symptoms are aggravated by walking and bending. She has tried heat for the symptoms. The treatment provided no relief.     Relevant past medical, surgical, family, and social history reviewed and updated as indicated.  Allergies and medications reviewed and updated.   Past Medical History:  Diagnosis Date   Anxiety    Asthma    Back problem    Breast tenderness in female 04/23/2015   Bronchitis    currently being treated for bronchititis,no fever   Bronchitis    hx   BV (bacterial vaginosis) 03/14/2015   Chlamydia    history of    Depression    History of chlamydia 04/23/2015   Vaginal discharge 03/14/2015   Vaginal Pap smear, abnormal     Past Surgical History:  Procedure Laterality Date   ABDOMINAL HYSTERECTOMY     ANTERIOR CERVICAL DECOMP/DISCECTOMY FUSION  10/08/2017   Procedure: Anterior Cervical Decompression/Discectomy Fusion - Cervical five-Cervical six;  Surgeon: Tia Alert, MD;  Location: [facility] OR;  Service: Neurosurgery;;   CESAREAN SECTION     times 2   LUMBAR LAMINECTOMY/DECOMPRESSION MICRODISCECTOMY Left 11/10/2014   Procedure: LUMBAR LAMINECTOMY/DECOMPRESSION MICRODISCECTOMY LEFT LUMBAR FIVE-SACRAL ONE;  Surgeon: Tia Alert, MD;  Location: [facility];  Service: Neurosurgery;  Laterality: Left;  left   TONSILLECTOMY     TUBAL LIGATION      Social History   Socioeconomic History  Marital status: Single    Spouse name: Not on file   Number of children: Not on file   Years of education: Not on file   Highest education level: Not on file  Occupational History   Not on file  Social Needs   Financial resource strain: Not on file   Food insecurity    Worry: Not on file    Inability: Not on file     Transportation needs    Medical: Not on file    Non-medical: Not on file  Tobacco Use   Smoking status: Current Every Day Smoker    Packs/day: 1.00    Years: 21.00    Pack years: 21.00    Types: Cigarettes   Smokeless tobacco: Never Used  Substance and Sexual Activity   Alcohol use: Yes    Comment: occ   Drug use: No   Sexual activity: Not Currently    Birth control/protection: Surgical    Comment: hyst  Lifestyle   Physical activity    Days per week: Not on file    Minutes per session: Not on file   Stress: Not on file  Relationships   Social connections    Talks on phone: Not on file    Gets together: Not on file    Attends religious service: Not on file    Active member of club or organization: Not on file    Attends meetings of clubs or organizations: Not on file    Relationship status: Not on file   Intimate partner violence    Fear of current or ex partner: Not on file    Emotionally abused: Not on file    Physically abused: Not on file    Forced sexual activity: Not on file  Other Topics Concern   Not on file  Social History Narrative   Not on file    Outpatient Encounter Medications as of 05/30/2019  Medication Sig   albuterol (PROAIR HFA) 108 (90 Base) MCG/ACT inhaler USE 2 PUFFS EVERY 6 HOURS AS NEEDED   ALPRAZolam (XANAX) 1 MG tablet TAKE 1 TABLET BY MOUTH FOUR TIMES DAILY AS NEEDED FOR ANXIETY - DECREASE ONE-HALF TABLET DAILY. Must make appointment   ARIPiprazole (ABILIFY) 5 MG tablet Take 1 tablet (5 mg total) by mouth daily.   busPIRone (BUSPAR) 10 MG tablet Take 1 tablet (10 mg total) by mouth 3 (three) times daily.   cetirizine (ZYRTEC) 10 MG tablet Take 1 tablet (10 mg total) by mouth daily.   DULoxetine (CYMBALTA) 60 MG capsule Take 1 capsule (60 mg total) by mouth daily.   EPINEPHrine 0.3 mg/0.3 mL IJ SOAJ injection Inject one dose intramuscularly for allergic reaction. May repeat one dose if needed after 5-15 minutes. Proceed  to the ER   ibuprofen (IBU) 800 MG tablet Take 1 tablet (800 mg total) by mouth every 8 (eight) hours as needed.   Olopatadine HCl (PATADAY) 0.2 % SOLN Apply 1 drop to eye 3 (three) times daily - between meals and at bedtime.   sulfamethoxazole-trimethoprim (BACTRIM DS) 800-160 MG tablet Take 1 tablet by mouth 2 (two) times daily for 10 days.   No facility-administered encounter medications on file as of 05/30/2019.     No Known Allergies  Review of Systems  Constitutional: Negative for activity change, appetite change, chills, diaphoresis, fatigue, fever and unexpected weight change.  HENT: Negative.  Negative for congestion and sore throat.   Eyes: Negative.   Respiratory: Negative for cough, chest tightness and  shortness of breath.   Cardiovascular: Negative for chest pain, palpitations and leg swelling.  Gastrointestinal: Negative for abdominal pain, anal bleeding, anorexia, blood in stool, change in bowel habit, constipation, diarrhea, nausea and vomiting.  Endocrine: Negative.   Genitourinary: Negative for decreased urine volume, difficulty urinating, dysuria, frequency, urgency, vaginal bleeding, vaginal discharge and vaginal pain.  Musculoskeletal: Negative for arthralgias, joint swelling, myalgias and neck pain.  Skin: Positive for color change and rash. Negative for pallor and wound.  Allergic/Immunologic: Negative.   Neurological: Negative for dizziness, vertigo, weakness, numbness and headaches.  Hematological: Negative.   Psychiatric/Behavioral: Negative for confusion, hallucinations, sleep disturbance and suicidal ideas.  All other systems reviewed and are negative.        Observations/Objective: No vital signs or physical exam, this was a telephone or virtual health encounter.  Pt alert and oriented, answers all questions appropriately, and able to speak in full sentences.    Assessment and Plan: [place] was seen today for abscess.  Diagnoses and all orders for  this visit:  Cellulitis and abscess of buttock Reported symptoms consistent with abscess to left buttock with surrounding erythema. No red flags concerning for sepsis or bacteremia. Will treat with Bactrim. Symptomatic care discussed. Pt aware to report any new or worsening symptoms. Follow up if no improvement after 4 days of antibiotic therapy.  -     sulfamethoxazole-trimethoprim (BACTRIM DS) 800-160 MG tablet; Take 1 tablet by mouth 2 (two) times daily for 10 days.     Follow Up Instructions: Return if symptoms worsen or fail to improve.    I discussed the assessment and treatment plan with the patient. The patient was provided an opportunity to ask questions and all were answered. The patient agreed with the plan and demonstrated an understanding of the instructions.   The patient was advised to call back or seek an in-person evaluation if the symptoms worsen or if the condition fails to improve as anticipated.  The above assessment and management plan was discussed with the patient. The patient verbalized understanding of and has agreed to the management plan. Patient is aware to call the clinic if they develop any new symptoms or if symptoms persist or worsen. Patient is aware when to return to the clinic for a follow-up visit. Patient educated on when it is appropriate to go to the emergency department.    I provided 15 minutes of non-face-to-face time during this encounter. The call started at 1500. The call ended at 1515. The other time was used for coordination of care.    Kari BaarsMichelle Shanira Tine, FNP-C [facility] [address] [place], [place] [address] [phone] 05/30/19

## 2019-06-14 ENCOUNTER — Other Ambulatory Visit: Payer: Self-pay

## 2019-06-14 ENCOUNTER — Ambulatory Visit (INDEPENDENT_AMBULATORY_CARE_PROVIDER_SITE_OTHER): Payer: Medicaid Other

## 2019-06-14 DIAGNOSIS — Z23 Encounter for immunization: Secondary | ICD-10-CM | POA: Diagnosis not present

## 2019-08-09 ENCOUNTER — Other Ambulatory Visit: Payer: Self-pay | Admitting: Physician Assistant

## 2019-08-09 DIAGNOSIS — F411 Generalized anxiety disorder: Secondary | ICD-10-CM

## 2019-08-09 DIAGNOSIS — F39 Unspecified mood [affective] disorder: Secondary | ICD-10-CM

## 2019-08-23 ENCOUNTER — Ambulatory Visit (INDEPENDENT_AMBULATORY_CARE_PROVIDER_SITE_OTHER): Payer: Medicaid Other | Admitting: Physician Assistant

## 2019-08-23 ENCOUNTER — Encounter: Payer: Self-pay | Admitting: Physician Assistant

## 2019-08-23 ENCOUNTER — Other Ambulatory Visit: Payer: Self-pay

## 2019-08-23 VITALS — BP 128/87 | HR 113 | Temp 96.6°F | Ht 63.0 in | Wt 210.4 lb

## 2019-08-23 DIAGNOSIS — F39 Unspecified mood [affective] disorder: Secondary | ICD-10-CM | POA: Diagnosis not present

## 2019-08-23 DIAGNOSIS — Z0001 Encounter for general adult medical examination with abnormal findings: Secondary | ICD-10-CM | POA: Diagnosis not present

## 2019-08-23 DIAGNOSIS — R300 Dysuria: Secondary | ICD-10-CM | POA: Diagnosis not present

## 2019-08-23 DIAGNOSIS — F411 Generalized anxiety disorder: Secondary | ICD-10-CM

## 2019-08-23 DIAGNOSIS — Z79899 Other long term (current) drug therapy: Secondary | ICD-10-CM

## 2019-08-23 DIAGNOSIS — Z0000 Encounter for general adult medical examination without abnormal findings: Secondary | ICD-10-CM

## 2019-08-23 LAB — URINALYSIS, COMPLETE
Bilirubin, UA: NEGATIVE
Glucose, UA: NEGATIVE
Ketones, UA: NEGATIVE
Leukocytes,UA: NEGATIVE
Nitrite, UA: NEGATIVE
Protein,UA: NEGATIVE
RBC, UA: NEGATIVE
Specific Gravity, UA: 1.02 (ref 1.005–1.030)
Urobilinogen, Ur: 0.2 mg/dL (ref 0.2–1.0)
pH, UA: 6.5 (ref 5.0–7.5)

## 2019-08-23 LAB — MICROSCOPIC EXAMINATION
Epithelial Cells (non renal): >10 /hpf — AB (ref 0–10)
Renal Epithel, UA: NONE SEEN /hpf

## 2019-08-23 MED ORDER — ALPRAZOLAM 1 MG PO TABS: 1.0000 mg | ORAL_TABLET | Freq: Three times a day (TID) | ORAL | 0 refills | Status: DC | PRN

## 2019-08-23 MED ORDER — ALPRAZOLAM 1 MG PO TABS: 1.0000 mg | ORAL_TABLET | Freq: Three times a day (TID) | ORAL | 2 refills | Status: DC | PRN

## 2019-08-23 MED ORDER — BUSPIRONE HCL 15 MG PO TABS: 15.0000 mg | ORAL_TABLET | Freq: Three times a day (TID) | ORAL | 5 refills | Status: DC

## 2019-08-23 NOTE — Patient Instructions (Signed)

## 2019-08-24 LAB — CBC WITH DIFFERENTIAL/PLATELET
Basophils Absolute: 0.1 10*3/uL (ref 0.0–0.2)
Basos: 1 %
EOS (ABSOLUTE): 0.1 10*3/uL (ref 0.0–0.4)
Eos: 1 %
Hematocrit: 41.6 % (ref 34.0–46.6)
Hemoglobin: 14 g/dL (ref 11.1–15.9)
Immature Grans (Abs): 0 10*3/uL (ref 0.0–0.1)
Immature Granulocytes: 0 %
Lymphocytes Absolute: 2.8 10*3/uL (ref 0.7–3.1)
Lymphs: 32 %
MCH: 31.9 pg (ref 26.6–33.0)
MCHC: 33.7 g/dL (ref 31.5–35.7)
MCV: 95 fL (ref 79–97)
Monocytes Absolute: 0.6 10*3/uL (ref 0.1–0.9)
Monocytes: 7 %
Neutrophils Absolute: 5.1 10*3/uL (ref 1.4–7.0)
Neutrophils: 59 %
Platelets: 332 10*3/uL (ref 150–450)
RBC: 4.39 x10E6/uL (ref 3.77–5.28)
RDW: 12.5 % (ref 11.7–15.4)
WBC: 8.8 10*3/uL (ref 3.4–10.8)

## 2019-08-24 LAB — CMP14+EGFR
ALT: 24 IU/L (ref 0–32)
AST: 26 IU/L (ref 0–40)
Albumin/Globulin Ratio: 1.8 (ref 1.2–2.2)
Albumin: 4.4 g/dL (ref 3.8–4.8)
Alkaline Phosphatase: 97 IU/L (ref 39–117)
BUN/Creatinine Ratio: 11 (ref 9–23)
BUN: 8 mg/dL (ref 6–20)
Bilirubin Total: <0.2 mg/dL (ref 0.0–1.2)
CO2: 20 mmol/L (ref 20–29)
Calcium: 9.3 mg/dL (ref 8.7–10.2)
Chloride: 104 mmol/L (ref 96–106)
Creatinine, Ser: 0.76 mg/dL (ref 0.57–1.00)
GFR calc Af Amer: 114 mL/min/{1.73_m2} (ref >59)
GFR calc non Af Amer: 99 mL/min/{1.73_m2} (ref >59)
Globulin, Total: 2.4 g/dL (ref 1.5–4.5)
Glucose: 98 mg/dL (ref 65–99)
Potassium: 4.4 mmol/L (ref 3.5–5.2)
Sodium: 142 mmol/L (ref 134–144)
Total Protein: 6.8 g/dL (ref 6.0–8.5)

## 2019-08-24 LAB — TSH: TSH: 1.46 u[IU]/mL (ref 0.450–4.500)

## 2019-08-25 LAB — URINE CULTURE

## 2019-08-26 LAB — TOXASSURE SELECT 13 (MW), URINE

## 2019-08-27 LAB — PAP LB, CT-NG TV HPV-HR
Chlamydia, Nuc. Acid Amp: NEGATIVE
Gonococcus, Nuc. Acid Amp: NEGATIVE
HPV, high-risk: NEGATIVE
Trich vag by NAA: NEGATIVE

## 2019-08-28 ENCOUNTER — Encounter: Payer: Self-pay | Admitting: Physician Assistant

## 2019-08-28 ENCOUNTER — Other Ambulatory Visit: Payer: Self-pay | Admitting: Physician Assistant

## 2019-08-28 DIAGNOSIS — F39 Unspecified mood [affective] disorder: Secondary | ICD-10-CM

## 2019-08-28 DIAGNOSIS — F411 Generalized anxiety disorder: Secondary | ICD-10-CM

## 2019-08-28 MED ORDER — ALPRAZOLAM 0.5 MG PO TABS: 1.0000 mg | ORAL_TABLET | Freq: Three times a day (TID) | ORAL | 0 refills | Status: DC | PRN

## 2019-08-29 NOTE — Progress Notes (Signed)
Acute Office Visit  Subjective:    Patient ID: Alice Santiago, female    DOB: 25-Oct-1980, 38 y.o.   MRN: [id number]  Chief Complaint  Patient presents with  . Annual Exam   This patient comes in for annual well physical examination. All medications are reviewed today. There are no reports of any problems with the medications. All of the medical conditions are reviewed and updated.  Lab work is reviewed and will be ordered as medically necessary. There are no new problems reported with today's visit.  Patient reports doing well overall.  Depression screen [facility] 2/9 08/23/2019 11/12/2018 05/20/2018 03/10/2018 09/29/2017  Decreased Interest 2 2 2 2 2   Down, Depressed, Hopeless 2 3 3 2 3   PHQ - 2 Score 4 5 5 4 5   Altered sleeping 1 3 2 2 2   Tired, decreased energy 1 3 3 2 3   Change in appetite 1 3 3 3 3   Feeling bad or failure about yourself  2 3 3 1 3   Trouble concentrating 3 3 3 3 3   Moving slowly or fidgety/restless 1 0 3 2 2   Suicidal thoughts 0 0 1 0 0  PHQ-9 Score 13 20 23 17 21   Difficult doing work/chores Somewhat difficult - - - -   GAD 7 : Generalized Anxiety Score 08/23/2019  Nervous, Anxious, on Edge 2  Control/stop worrying 3  Worry too much - different things 3  Trouble relaxing 2  Restless 2  Easily annoyed or irritable 3  Afraid - awful might happen 0  Total GAD 7 Score 15  Anxiety Difficulty Somewhat difficult     Depression        This is a chronic problem.  The current episode started 1 to 4 weeks ago.   The problem occurs daily.  The problem has been waxing and waning since onset.  Associated symptoms include decreased concentration, fatigue and sad.  Associated symptoms include no suicidal ideas.     The symptoms are aggravated by family issues and social issues.  Past treatments include SSRIs - Selective serotonin reuptake inhibitors and SNRIs - Serotonin and norepinephrine reuptake inhibitors.  Compliance with treatment is variable.  Previous treatment provided  mild relief.  Past medical history includes anxiety.   Anxiety Presents for follow-up visit. Symptoms include decreased concentration, excessive worry and irritability. Patient reports no chest pain, palpitations or suicidal ideas. Symptoms occur most days. The severity of symptoms is moderate. The quality of sleep is fair.      Past Medical History:  Diagnosis Date  . Anxiety   . Asthma   . Back problem   . Breast tenderness in female 04/23/2015  . Bronchitis    currently being treated for bronchititis,no fever  . Bronchitis    hx  . BV (bacterial vaginosis) 03/14/2015  . Chlamydia    history of   . Depression   . History of chlamydia 04/23/2015  . Vaginal discharge 03/14/2015  . Vaginal Pap smear, abnormal     Past Surgical History:  Procedure Laterality Date  . ABDOMINAL HYSTERECTOMY    . ANTERIOR CERVICAL DECOMP/DISCECTOMY FUSION  10/08/2017   Procedure: Anterior Cervical Decompression/Discectomy Fusion - Cervical five-Cervical six;  Surgeon: Eustace Moore, MD;  Location: [place];  Service: Neurosurgery;;  . CESAREAN SECTION     times 2  . LUMBAR LAMINECTOMY/DECOMPRESSION MICRODISCECTOMY Left 11/10/2014   Procedure: LUMBAR LAMINECTOMY/DECOMPRESSION MICRODISCECTOMY LEFT LUMBAR FIVE-SACRAL ONE;  Surgeon: Eustace Moore, MD;  Location: [facility];  Service: Neurosurgery;  Laterality: Left;  left  . TONSILLECTOMY    . TUBAL LIGATION      Family History  Problem Relation Age of Onset  . Depression Mother   . Anxiety disorder Mother   . Heart murmur Father   . Other Son        problems with bowels  . Asthma Son   . ADD / ADHD Son   . ODD Son   . Asthma Son   . Cancer Neg Hx     Social History   Socioeconomic History  . Marital status: Single    Spouse name: Not on file  . Number of children: Not on file  . Years of education: Not on file  . Highest education level: Not on file  Occupational History  . Not on file  Tobacco Use  . Smoking status: Current Every Day  Smoker    Packs/day: 1.00    Years: 21.00    Pack years: 21.00    Types: Cigarettes  . Smokeless tobacco: Never Used  Substance and Sexual Activity  . Alcohol use: Yes    Comment: occ  . Drug use: No  . Sexual activity: Not Currently    Birth control/protection: Surgical    Comment: hyst  Other Topics Concern  . Not on file  Social History Narrative  . Not on file   Social Determinants of Health   Financial Resource Strain:   . Difficulty of Paying Living Expenses: Not on file  Food Insecurity:   . Worried About Charity fundraiser in the Last Year: Not on file  . Ran Out of Food in the Last Year: Not on file  Transportation Needs:   . Lack of Transportation (Medical): Not on file  . Lack of Transportation (Non-Medical): Not on file  Physical Activity:   . Days of Exercise per Week: Not on file  . Minutes of Exercise per Session: Not on file  Stress:   . Feeling of Stress : Not on file  Social Connections:   . Frequency of Communication with Friends and Family: Not on file  . Frequency of Social Gatherings with Friends and Family: Not on file  . Attends Religious Services: Not on file  . Active Member of Clubs or Organizations: Not on file  . Attends Archivist Meetings: Not on file  . Marital Status: Not on file  Intimate Partner Violence:   . Fear of Current or Ex-Partner: Not on file  . Emotionally Abused: Not on file  . Physically Abused: Not on file  . Sexually Abused: Not on file    Outpatient Medications Prior to Visit  Medication Sig Dispense Refill  . albuterol (PROAIR HFA) 108 (90 Base) MCG/ACT inhaler USE 2 PUFFS EVERY 6 HOURS AS NEEDED 18 g 2  . ARIPiprazole (ABILIFY) 5 MG tablet Take 1 tablet (5 mg total) by mouth daily. 30 tablet 11  . cetirizine (ZYRTEC) 10 MG tablet Take 1 tablet (10 mg total) by mouth daily. 30 tablet 11  . DULoxetine (CYMBALTA) 60 MG capsule Take 1 capsule (60 mg total) by mouth daily. 30 capsule 5  . EPINEPHrine 0.3  mg/0.3 mL IJ SOAJ injection Inject one dose intramuscularly for allergic reaction. May repeat one dose if needed after 5-15 minutes. Proceed to the ER  0  . ibuprofen (IBU) 800 MG tablet Take 1 tablet (800 mg total) by mouth every 8 (eight) hours as needed. 90 tablet 5  . Olopatadine HCl (  PATADAY) 0.2 % SOLN Apply 1 drop to eye 3 (three) times daily - between meals and at bedtime. 2.5 mL 11  . ALPRAZolam (XANAX) 1 MG tablet TAKE 1 TABLET BY MOUTH FOUR TIMES DAILY AS NEEDED FOR ANXIETY - DECREASE ONE-HALF TABLET DAILY. Must make appointment 115 tablet 2  . busPIRone (BUSPAR) 10 MG tablet Take 1 tablet (10 mg total) by mouth 3 (three) times daily. 90 tablet 2   No facility-administered medications prior to visit.    No Known Allergies  Review of Systems  Constitutional: Positive for fatigue and irritability. Negative for chills and fever.  HENT: Negative.   Respiratory: Negative.  Negative for cough.   Cardiovascular: Negative.  Negative for chest pain and palpitations.  Gastrointestinal: Negative.   Skin: Negative.   Neurological: Negative.   Psychiatric/Behavioral: Positive for decreased concentration and depression. Negative for suicidal ideas.       Objective:    Physical Exam Constitutional:      Appearance: She is well-developed.  HENT:     Head: Normocephalic and atraumatic.  Eyes:     Conjunctiva/sclera: Conjunctivae normal.     Pupils: Pupils are equal, round, and reactive to light.  Cardiovascular:     Rate and Rhythm: Normal rate and regular rhythm.     Heart sounds: Normal heart sounds.  Pulmonary:     Effort: Pulmonary effort is normal.     Breath sounds: Normal breath sounds.  Chest:     Breasts: Breasts are symmetrical.        Right: No mass, skin change or tenderness.        Left: No mass, skin change or tenderness.  Abdominal:     General: Bowel sounds are normal.     Palpations: Abdomen is soft.  Genitourinary:    Labia:        Right: No tenderness or  lesion.        Left: No tenderness or lesion.      Vagina: Normal. No vaginal discharge, tenderness or bleeding.     Cervix: No cervical motion tenderness, discharge or friability.     Uterus: Not deviated, not enlarged and not tender.      Adnexa:        Right: No mass, tenderness or fullness.         Left: No mass, tenderness or fullness.       Rectum: No anal fissure.  Musculoskeletal:     Cervical back: Normal range of motion and neck supple.  Skin:    General: Skin is warm and dry.     Findings: No rash.  Neurological:     Mental Status: She is alert and oriented to person, place, and time.     Deep Tendon Reflexes: Reflexes are normal and symmetric.  Psychiatric:        Behavior: Behavior normal.        Thought Content: Thought content normal.        Judgment: Judgment normal.     BP 128/87   Pulse (!) 113   Temp (!) 96.6 F (35.9 C) (Temporal)   Ht 5' 3"  (1.6 m)   Wt 210 lb 6.4 oz (95.4 kg)   SpO2 98%   BMI 37.27 kg/m  Wt Readings from Last 3 Encounters:  08/23/19 210 lb 6.4 oz (95.4 kg)  11/12/18 194 lb 6.4 oz (88.2 kg)  05/20/18 190 lb 12.8 oz (86.5 kg)    Health Maintenance Due  Topic Date Due  .  TETANUS/TDAP  03/26/1999  . PAP SMEAR-Modifier  07/26/2016    There are no preventive care reminders to display for this patient.   Lab Results  Component Value Date   TSH 1.460 08/23/2019   Lab Results  Component Value Date   WBC 8.8 08/23/2019   HGB 14.0 08/23/2019   HCT 41.6 08/23/2019   MCV 95 08/23/2019   PLT 332 08/23/2019   Lab Results  Component Value Date   NA 142 08/23/2019   K 4.4 08/23/2019   CO2 20 08/23/2019   GLUCOSE 98 08/23/2019   BUN 8 08/23/2019   CREATININE 0.76 08/23/2019   BILITOT <0.2 08/23/2019   ALKPHOS 97 08/23/2019   AST 26 08/23/2019   ALT 24 08/23/2019   PROT 6.8 08/23/2019   ALBUMIN 4.4 08/23/2019   CALCIUM 9.3 08/23/2019   ANIONGAP 8 03/10/2018   No results found for: CHOL No results found for: HDL No  results found for: LDLCALC No results found for: TRIG No results found for: CHOLHDL Lab Results  Component Value Date   HGBA1C 5.9 (H) 07/28/2014       Assessment & Plan:   Problem List Items Addressed This Visit      Other   Mood disorder ([place])   GAD (generalized anxiety disorder)   Relevant Medications   busPIRone (BUSPAR) 15 MG tablet    Other Visit Diagnoses    Dysuria    -  Primary   Relevant Orders   Urine culture (Completed)   urinalysis- dip and micro (Completed)   Controlled substance agreement signed       Relevant Orders   DRUG SCREEN-TOXASSURE (Completed)   Well adult exam       Relevant Orders   CBC with Differential/Platelet (Completed)   CMP14+EGFR (Completed)   TSH (Completed)   Pap Lb, Ct-Ng TV HPV-hr (Completed)       Meds ordered this encounter  Medications  . DISCONTD: ALPRAZolam (XANAX) 1 MG tablet    Sig: Take 1 tablet (1 mg total) by mouth 3 (three) times daily as needed for anxiety. After 2 weeks try to reduce by one more 1/2 tab    Dispense:  90 tablet    Refill:  2    Order Specific Question:   Supervising Provider    Answer:   Janora Norlander [[id number]]  . DISCONTD: ALPRAZolam (XANAX) 1 MG tablet    Sig: Take 1 tablet (1 mg total) by mouth 3 (three) times daily as needed for anxiety. After 2 weeks try to reduce by one more 1/2 tab    Dispense:  90 tablet    Refill:  0    No refill please    Order Specific Question:   Supervising Provider    Answer:   Janora Norlander [[id number]]  . busPIRone (BUSPAR) 15 MG tablet    Sig: Take 1 tablet (15 mg total) by mouth 3 (three) times daily.    Dispense:  90 tablet    Refill:  5    Order Specific Question:   Supervising Provider    Answer:   Janora Norlander [[id number]]     Terald Sleeper, PA-C

## 2019-09-30 ENCOUNTER — Encounter: Payer: Self-pay | Admitting: Physician Assistant

## 2019-09-30 ENCOUNTER — Ambulatory Visit (INDEPENDENT_AMBULATORY_CARE_PROVIDER_SITE_OTHER): Payer: Medicaid Other | Admitting: Physician Assistant

## 2019-09-30 DIAGNOSIS — N393 Stress incontinence (female) (male): Secondary | ICD-10-CM | POA: Diagnosis not present

## 2019-09-30 DIAGNOSIS — F411 Generalized anxiety disorder: Secondary | ICD-10-CM

## 2019-09-30 MED ORDER — MIRTAZAPINE 30 MG PO TABS: 30.0000 mg | ORAL_TABLET | Freq: Every day | ORAL | 2 refills | Status: DC

## 2019-09-30 NOTE — Patient Instructions (Signed)

## 2019-09-30 NOTE — Progress Notes (Signed)
[phone]      Telephone visit  Subjective: EU:MPNTIRW and incontinence PCP: Remus Loffler, PA-C [id number] Alice Santiago is a 39 y.o. female calls for telephone consult today. Patient provides verbal consent for consult held via phone.  Patient is identified with 2 separate identifiers.  At this time the entire area is on COVID-19 social distancing and stay home orders are in place.  Patient is of higher risk and therefore we are performing this by a virtual method.  Location of patient: home Location of provider: [facility] Others present for call: no  This patient continues with incontinence issues.  Previously it would happen more whenever she coughs or sneeze.  However at this point she will have spontaneous loss of bladder control.  She states she will not even feel the urge to go the bathroom but then will have a void.  She denies any fever or chills.  We did place referral for urology.  I see that they did try to call her on 09/28/2019.  I have given her the telephone number to give them a call back  Patient has finished titrating off of her alprazolam.  She had a positive drug screen at the last visit.  And therefore we had to titrate her off.  She is still having anxiety and difficulty with sleep.  I am add Remeron at bedtime, 30 mg.  And have encouraged her to take her BuSpar 1-1/2 tablets twice daily to keep it in her system for the anxiety.   ROS: Per HPI  No Known Allergies Past Medical History:  Diagnosis Date  . Anxiety   . Asthma   . Back problem   . Breast tenderness in female 04/23/2015  . Bronchitis    currently being treated for bronchititis,no fever  . Bronchitis    hx  . BV (bacterial vaginosis) 03/14/2015  . Chlamydia    history of   . Depression   . History of chlamydia 04/23/2015  . Vaginal discharge 03/14/2015  . Vaginal Pap smear, abnormal     Current Outpatient Medications:  .  albuterol (PROAIR HFA) 108 (90 Base) MCG/ACT inhaler, USE 2 PUFFS EVERY 6 HOURS  AS NEEDED, Disp: 18 g, Rfl: 2 .  ARIPiprazole (ABILIFY) 5 MG tablet, Take 1 tablet (5 mg total) by mouth daily., Disp: 30 tablet, Rfl: 11 .  busPIRone (BUSPAR) 15 MG tablet, Take 1 tablet (15 mg total) by mouth 3 (three) times daily., Disp: 90 tablet, Rfl: 5 .  cetirizine (ZYRTEC) 10 MG tablet, Take 1 tablet (10 mg total) by mouth daily., Disp: 30 tablet, Rfl: 11 .  DULoxetine (CYMBALTA) 60 MG capsule, Take 1 capsule (60 mg total) by mouth daily., Disp: 30 capsule, Rfl: 5 .  EPINEPHrine 0.3 mg/0.3 mL IJ SOAJ injection, Inject one dose intramuscularly for allergic reaction. May repeat one dose if needed after 5-15 minutes. Proceed to the ER, Disp: , Rfl: 0 .  ibuprofen (IBU) 800 MG tablet, Take 1 tablet (800 mg total) by mouth every 8 (eight) hours as needed., Disp: 90 tablet, Rfl: 5 .  mirtazapine (REMERON) 30 MG tablet, Take 1 tablet (30 mg total) by mouth at bedtime., Disp: 30 tablet, Rfl: 2 .  Olopatadine HCl (PATADAY) 0.2 % SOLN, Apply 1 drop to eye 3 (three) times daily - between meals and at bedtime., Disp: 2.5 mL, Rfl: 11  Assessment/ Plan: 39 y.o. female   1. GAD (generalized anxiety disorder) Discontinue alprazolam Encouraged regular BuSpar use Added Remeron 30 mg  at bed  2. Stress incontinence Encouraged her to reach out to urology office   Return in about 4 weeks (around 10/28/2019).  Continue all other maintenance medications as listed above.  Start time: 9:11 AM End time: 9:24 AM  Meds ordered this encounter  Medications  . mirtazapine (REMERON) 30 MG tablet    Sig: Take 1 tablet (30 mg total) by mouth at bedtime.    Dispense:  30 tablet    Refill:  2    Order Specific Question:   Supervising Provider    Answer:   Janora Norlander [[id number]]    Particia Nearing PA-C [place] [phone]

## 2019-10-12 ENCOUNTER — Telehealth: Payer: Self-pay | Admitting: Physician Assistant

## 2019-10-12 NOTE — Telephone Encounter (Signed)
The patient had a failed drug screen and therefore I cannot prescribe controlled substances to her anymore.  We can make a referral for psychiatry.

## 2019-10-12 NOTE — Telephone Encounter (Signed)
She is speaking of the alprazolam. The other meds do not help that much.

## 2019-10-12 NOTE — Telephone Encounter (Signed)
Patient aware verbalized understanding. Patient was not happy and does not want referral

## 2019-11-15 ENCOUNTER — Other Ambulatory Visit: Payer: Self-pay | Admitting: Physician Assistant

## 2019-11-15 DIAGNOSIS — F39 Unspecified mood [affective] disorder: Secondary | ICD-10-CM

## 2019-11-15 DIAGNOSIS — F411 Generalized anxiety disorder: Secondary | ICD-10-CM

## 2019-11-22 ENCOUNTER — Other Ambulatory Visit: Payer: Self-pay

## 2019-11-22 ENCOUNTER — Ambulatory Visit: Payer: Medicaid Other | Admitting: Urology

## 2019-11-22 ENCOUNTER — Encounter: Payer: Self-pay | Admitting: Urology

## 2019-11-22 VITALS — BP 126/79 | HR 101 | Temp 98.2°F | Ht 63.0 in | Wt 212.0 lb

## 2019-11-22 DIAGNOSIS — N393 Stress incontinence (female) (male): Secondary | ICD-10-CM

## 2019-11-22 DIAGNOSIS — N3281 Overactive bladder: Secondary | ICD-10-CM | POA: Diagnosis not present

## 2019-11-22 LAB — POCT URINALYSIS DIPSTICK
Appearance: NEGATIVE
Bilirubin, UA: NEGATIVE
Blood, UA: NEGATIVE
Glucose, UA: NEGATIVE
Ketones, UA: NEGATIVE
Leukocytes, UA: NEGATIVE
Nitrite, UA: NEGATIVE
Protein, UA: NEGATIVE
Spec Grav, UA: 1.025 (ref 1.010–1.025)
Urobilinogen, UA: 0.2 E.U./dL
pH, UA: 7 (ref 5.0–8.0)

## 2019-11-22 LAB — BLADDER SCAN AMB NON-IMAGING: Scan Result: 25.5

## 2019-11-22 NOTE — Progress Notes (Signed)
Urological Symptom Review  Patient is experiencing the following symptoms: Frequent urination Hard to postpone urination Get up at night to urinate Leakage of urine   Review of Systems  Gastrointestinal (upper)  : Negative for upper GI symptoms  Gastrointestinal (lower) : Negative for lower GI symptoms  Constitutional : Negative for symptoms  Skin: Negative for skin symptoms  Eyes: Negative for eye symptoms  Ear/Nose/Throat : Negative for Ear/Nose/Throat symptoms  Hematologic/Lymphatic: Negative for Hematologic/Lymphatic symptoms  Cardiovascular : Negative for cardiovascular symptoms  Respiratory : Negative for respiratory symptoms  Endocrine: Negative for endocrine symptoms  Musculoskeletal: Negative for musculoskeletal symptoms  Neurological: Negative for neurological symptoms  Psychologic: Negative for psychiatric symptoms 

## 2019-11-22 NOTE — Progress Notes (Signed)
H&P  Chief Complaint: Urinary Incontinence  History of Present Illness:  3.16.2021: Alice Santiago is a 39 y.o. year old female c/o OAB like sx's (mixed urinary incontinence, urinary freq/urgnecy, nocturia). She feels like this has been a noticeable issue for around 1-2 years but has struggled to be seen per our office. She has not been wearing pads because these are not well tolerated but she has had freq episodes of leakage. She also experiences stress urinary incontinence when coughing and sneezing (this is more freq than her UUI). She receives regular GU exams per her PCP and has had a partial hysterectomy. She drinks a high volume of caffeinated sodas (Coke or Pepsi, on average 4-5 12 oz cans a day). PVR today is 25.5 mL.   She reports having had a hx of UTI's -- her sx's typically include dysuria, freq, and incomplete emptying. These are usually confirmed with UA and/or culture. She also reports a family hx of "kidney issues" with her mother having her "bladder stretched" at least once a year.   Past Medical History:  Diagnosis Date  . Anxiety   . Asthma   . Back problem   . Breast tenderness in female 04/23/2015  . Bronchitis    currently being treated for bronchititis,no fever  . Bronchitis    hx  . BV (bacterial vaginosis) 03/14/2015  . Chlamydia    history of   . Depression   . History of chlamydia 04/23/2015  . Vaginal discharge 03/14/2015  . Vaginal Pap smear, abnormal     Past Surgical History:  Procedure Laterality Date  . ABDOMINAL HYSTERECTOMY    . ANTERIOR CERVICAL DECOMP/DISCECTOMY FUSION  10/08/2017   Procedure: Anterior Cervical Decompression/Discectomy Fusion - Cervical five-Cervical six;  Surgeon: Tia Alert, MD;  Location: [facility];  Service: Neurosurgery;;  . CESAREAN SECTION     times 2  . LUMBAR LAMINECTOMY/DECOMPRESSION MICRODISCECTOMY Left 11/10/2014   Procedure: LUMBAR LAMINECTOMY/DECOMPRESSION MICRODISCECTOMY LEFT LUMBAR FIVE-SACRAL ONE;  Surgeon: Tia Alert, MD;  Location: [facility];  Service: Neurosurgery;  Laterality: Left;  left  . TONSILLECTOMY    . TUBAL LIGATION      Home Medications:  (Not in a hospital admission)   Allergies: No Known Allergies  Family History  Problem Relation Age of Onset  . Depression Mother   . Anxiety disorder Mother   . Heart murmur Father   . Other Son        problems with bowels  . Asthma Son   . ADD / ADHD Son   . ODD Son   . Asthma Son   . Cancer Neg Hx     Social History:  reports that she has been smoking cigarettes. She has a 21.00 pack-year smoking history. She has never used smokeless tobacco. She reports current alcohol use. She reports that she does not use drugs.  ROS: A complete review of systems was performed.  All systems are negative except for pertinent findings as noted.  Physical Exam:  Vital signs in last 24 hours: Temp:  [98.2 F (36.8 C)] 98.2 F (36.8 C) (03/16 1606) Pulse Rate:  [101] 101 (03/16 1606) BP: (126)/(79) 126/79 (03/16 1606) Weight:  [212 lb (96.2 kg)] 212 lb (96.2 kg) (03/16 1606) General:  Alert and oriented, No acute distress HEENT: Normocephalic, atraumatic Neck: No JVD or lymphadenopathy Cardiovascular: Regular rate  Extremities: No edema Neurologic: Grossly intact  Laboratory Data:  Results for orders placed or performed in visit on 11/22/19 (from  the past 24 hour(s))  POCT urinalysis dipstick     Status: None   Collection Time: 11/22/19  4:17 PM  Result Value Ref Range   Color, UA yellow    Clarity, UA clear    Glucose, UA Negative Negative   Bilirubin, UA neg    Ketones, UA neg    Spec Grav, UA 1.025 1.010 - 1.025   Blood, UA neg    pH, UA 7.0 5.0 - 8.0   Protein, UA Negative Negative   Urobilinogen, UA 0.2 0.2 or 1.0 E.U./dL   Nitrite, UA neg    Leukocytes, UA Negative Negative   Appearance neg    Odor     I have reviewed prior pt notes  I have reviewed notes from referring/previous physicians  I have reviewed  urinalysis results   Impression/Assessment:  She does have moderate stress urinary incontinence. Her other sx's are more than likely secondary to OAB -- addressing these sx's may also help alleviate her SUI. We will start with dietary and behavioral modifications to address her sx's.   Plan:  1. OAB guide sheet given for her to try non-medical management of her urinary sx's. Specifically, she should try to limit her intake of caffeinated sodas, work on smoking cessation, and try bladder retraining exercises.   2. If her SUI does not improve following non-medical (and potentially medical) treatment of her OAB, we will discuss surgical options  3. Return in 3 mo for OV. We will discuss starting her on an OAB medication at this time.   Dena Billet 11/22/2019, 4:30 PM  Lillette Boxer. Azura Tufaro MD

## 2020-01-08 ENCOUNTER — Encounter (HOSPITAL_COMMUNITY): Payer: Self-pay | Admitting: *Deleted

## 2020-01-08 ENCOUNTER — Emergency Department (HOSPITAL_COMMUNITY)
Admission: EM | Admit: 2020-01-08 | Discharge: 2020-01-08 | Disposition: A | Discharge status: Home / Self Care | Payer: Medicaid Other | Attending: Emergency Medicine | Admitting: Emergency Medicine

## 2020-01-08 ENCOUNTER — Other Ambulatory Visit: Payer: Self-pay

## 2020-01-08 ENCOUNTER — Emergency Department (HOSPITAL_COMMUNITY): Payer: Medicaid Other

## 2020-01-08 DIAGNOSIS — R079 Chest pain, unspecified: Secondary | ICD-10-CM

## 2020-01-08 DIAGNOSIS — F419 Anxiety disorder, unspecified: Secondary | ICD-10-CM

## 2020-01-08 DIAGNOSIS — Z79899 Other long term (current) drug therapy: Secondary | ICD-10-CM | POA: Diagnosis not present

## 2020-01-08 DIAGNOSIS — J45909 Unspecified asthma, uncomplicated: Secondary | ICD-10-CM | POA: Insufficient documentation

## 2020-01-08 DIAGNOSIS — F17210 Nicotine dependence, cigarettes, uncomplicated: Secondary | ICD-10-CM | POA: Insufficient documentation

## 2020-01-08 DIAGNOSIS — R0789 Other chest pain: Secondary | ICD-10-CM | POA: Diagnosis not present

## 2020-01-08 LAB — BASIC METABOLIC PANEL
Anion gap: 7 (ref 5–15)
BUN: 12 mg/dL (ref 6–20)
CO2: 22 mmol/L (ref 22–32)
Calcium: 8.6 mg/dL — ABNORMAL LOW (ref 8.9–10.3)
Chloride: 109 mmol/L (ref 98–111)
Creatinine, Ser: 0.64 mg/dL (ref 0.44–1.00)
GFR calc Af Amer: >60 mL/min (ref >60)
GFR calc non Af Amer: >60 mL/min (ref >60)
Glucose, Bld: 114 mg/dL — ABNORMAL HIGH (ref 70–99)
Potassium: 4.1 mmol/L (ref 3.5–5.1)
Sodium: 138 mmol/L (ref 135–145)

## 2020-01-08 LAB — CBC
HCT: 41.5 % (ref 36.0–46.0)
Hemoglobin: 13.7 g/dL (ref 12.0–15.0)
MCH: 32.1 pg (ref 26.0–34.0)
MCHC: 33 g/dL (ref 30.0–36.0)
MCV: 97.2 fL (ref 80.0–100.0)
Platelets: 268 10*3/uL (ref 150–400)
RBC: 4.27 MIL/uL (ref 3.87–5.11)
RDW: 13.1 % (ref 11.5–15.5)
WBC: 8.8 10*3/uL (ref 4.0–10.5)
nRBC: 0 % (ref 0.0–0.2)

## 2020-01-08 LAB — TROPONIN I (HIGH SENSITIVITY): Troponin I (High Sensitivity): 2 ng/L (ref <18)

## 2020-01-08 MED ORDER — SODIUM CHLORIDE 0.9% FLUSH: 3.0000 mL | Freq: Once | INTRAVENOUS | Status: DC

## 2020-01-08 MED ORDER — HYDROXYZINE HCL 25 MG PO TABS: 100.0000 mg | ORAL_TABLET | Freq: Once | ORAL | Status: DC

## 2020-01-08 MED ORDER — SODIUM CHLORIDE 0.9 % IV BOLUS: 1000.0000 mL | Freq: Once | INTRAVENOUS | Status: DC

## 2020-01-08 MED ORDER — HYDROXYZINE HCL 25 MG PO TABS
25.0000 mg | ORAL_TABLET | Freq: Four times a day (QID) | ORAL | 0 refills | Status: DC
Start: 2020-01-08 — End: 2020-02-09
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
## 2020-01-08 NOTE — Discharge Instructions (Addendum)
Please follow-up with your primary care doctor.  I have provided you with a resource list that has some therapist/counseling to be helpful for you.  Please ignore the substance abuse portion of this.  Please return to ED if you have any new or concerning symptoms.  Please use hydroxyzine as prescribed.

## 2020-01-08 NOTE — ED Triage Notes (Signed)
Patient sent over by urgent care for abnormal EKG today during visit.  Patient seen for intermittent chest pain for 2 days.  Patient denies cardiac history.  EMS reports EKG sinus rhythm.
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
## 2020-02-09 ENCOUNTER — Encounter: Payer: Self-pay | Admitting: Family Medicine

## 2020-02-09 ENCOUNTER — Ambulatory Visit (INDEPENDENT_AMBULATORY_CARE_PROVIDER_SITE_OTHER): Payer: Medicaid Other | Admitting: Family Medicine

## 2020-02-09 DIAGNOSIS — J4541 Moderate persistent asthma with (acute) exacerbation: Secondary | ICD-10-CM | POA: Diagnosis not present

## 2020-02-09 DIAGNOSIS — W57XXXD Bitten or stung by nonvenomous insect and other nonvenomous arthropods, subsequent encounter: Secondary | ICD-10-CM

## 2020-02-09 DIAGNOSIS — R52 Pain, unspecified: Secondary | ICD-10-CM | POA: Diagnosis not present

## 2020-02-09 DIAGNOSIS — G479 Sleep disorder, unspecified: Secondary | ICD-10-CM | POA: Diagnosis not present

## 2020-02-09 DIAGNOSIS — F411 Generalized anxiety disorder: Secondary | ICD-10-CM | POA: Diagnosis not present

## 2020-02-09 MED ORDER — ALBUTEROL SULFATE HFA 108 (90 BASE) MCG/ACT IN AERS: 2.0000 | INHALATION_SPRAY | Freq: Four times a day (QID) | RESPIRATORY_TRACT | 2 refills | Status: DC | PRN

## 2020-02-09 MED ORDER — HYDROXYZINE HCL 25 MG PO TABS: 25.0000 mg | ORAL_TABLET | Freq: Three times a day (TID) | ORAL | 0 refills | Status: DC | PRN

## 2020-02-09 MED ORDER — DULOXETINE HCL 30 MG PO CPEP: 30.0000 mg | ORAL_CAPSULE | Freq: Every day | ORAL | 0 refills | Status: DC

## 2020-02-09 NOTE — Progress Notes (Signed)
Virtual Visit via Telephone Note  I connected with Alice Santiago on 02/09/20 at 10:26 AM by telephone and verified that I am speaking with the correct person using two identifiers. Alice Santiago is currently located at home and nobody is currently with her during this visit. The provider, Gwenlyn Fudge, FNP is located in their home at time of visit.  I discussed the limitations, risks, security and privacy concerns of performing an evaluation and management service by telephone and the availability of in person appointments. I also discussed with the patient that there may be a patient responsible charge related to this service. The patient expressed understanding and agreed to proceed.  Subjective: PCP: Remus Loffler, PA-C  Chief Complaint  Patient presents with  . Medical Management of Chronic Issues   Patient reports she was seen in the ER multiple times throughout the month of May.  She most recently was seen for tick and bug bites for which she was prescribed doxycycline.  She has not yet completed the course of antibiotics.  She was also given Zofran for nausea.  Patient reports her anxiety has been out of control.  She has not been on her medications for a couple of months now as she has not been able to afford them.  She does mention that she has been on Xanax for years and her previous PCP just stopped it cold [place] for no reason.  She does have Medicaid and states her prescriptions are only $3 apiece but she cannot afford them until she gets her next child support check.  She does report neck and back pain that has resulted from laying around so much.  When asked why she is laying around so much she reports it is because she just does not feel well and has been feeling "yucky".  Patient also concerned that it has been hard for her to sleep recently.   ROS: Per HPI  Current Outpatient Medications:  .  albuterol (PROAIR HFA) 108 (90 Base) MCG/ACT inhaler, USE 2 PUFFS EVERY  6 HOURS AS NEEDED (Patient not taking: Reported on 01/08/2020), Disp: 18 g, Rfl: 2 .  ARIPiprazole (ABILIFY) 5 MG tablet, Take 1 tablet (5 mg total) by mouth daily. (Patient not taking: Reported on 01/08/2020), Disp: 30 tablet, Rfl: 11 .  DULoxetine (CYMBALTA) 60 MG capsule, TAKE ONE CAPSULE BY MOUTH EVERY DAY (Patient not taking: Reported on 01/08/2020), Disp: 30 capsule, Rfl: 1 .  EPINEPHrine 0.3 mg/0.3 mL IJ SOAJ injection, Inject 0.3 mg into the muscle as needed. , Disp: , Rfl: 0 .  hydrOXYzine (ATARAX/VISTARIL) 25 MG tablet, Take 1 tablet (25 mg total) by mouth every 6 (six) hours., Disp: 12 tablet, Rfl: 0 .  ibuprofen (IBU) 800 MG tablet, Take 1 tablet (800 mg total) by mouth every 8 (eight) hours as needed., Disp: 90 tablet, Rfl: 5 .  nitroGLYCERIN (NITROSTAT) 0.4 MG SL tablet, Place 0.4 mg under the tongue every 5 (five) minutes as needed for chest pain., Disp: , Rfl:   No Known Allergies Past Medical History:  Diagnosis Date  . Anxiety   . Asthma   . Back problem   . Breast tenderness in female 04/23/2015  . Bronchitis    currently being treated for bronchititis,no fever  . BV (bacterial vaginosis) 03/14/2015  . Depression   . History of chlamydia 04/23/2015  . Vaginal Pap smear, abnormal     Observations/Objective: A&O  No respiratory distress or wheezing audible over the phone  Mood, judgement, and thought processes all WNL   Assessment and Plan: 1. GAD (generalized anxiety disorder) - Uncontrolled.  Patient prescribed Cymbalta 30 mg x 2 weeks, she can then increase back to the 60 mg capsules that she has at home.  She is going to also resume her Abilify.  Hydroxyzine as needed.  Patient does have Cymbalta 60 mg and Abilify 5 mg at home, she just has not been taking them.  Discussed with her that upon reviewing her chart it appears her previous PCP stopped her Xanax because she failed a urine drug screen and that she was appropriately weaned off.  Also discussed that I would not be  restarting her Xanax as I do not use Xanax for long-term management of anxiety. - DULoxetine (CYMBALTA) 30 MG capsule; Take 1 capsule (30 mg total) by mouth daily for 14 days.  Dispense: 14 capsule; Refill: 0 - hydrOXYzine (ATARAX/VISTARIL) 25 MG tablet; Take 1 tablet (25 mg total) by mouth every 8 (eight) hours as needed for anxiety.  Dispense: 30 tablet; Refill: 0  2. Difficulty sleeping - Encouraged to try melatonin or Unisom.  3. Generalized pain - Discussed pain should improve when she gets up out of the bed.  4. Tick bite, subsequent encounter - Encouraged to complete entire course of antibiotics.  5. Moderate persistent asthma with acute exacerbation - New albuterol inhaler prescribed as the one that patient has at home is expired. - albuterol (PROAIR HFA) 108 (90 Base) MCG/ACT inhaler; Inhale 2 puffs into the lungs every 6 (six) hours as needed for wheezing or shortness of breath.  Dispense: 18 g; Refill: 2   Follow Up Instructions: Return in about 6 weeks (around 03/22/2020) for anxiety & sleep.  I discussed the assessment and treatment plan with the patient. The patient was provided an opportunity to ask questions and all were answered. The patient agreed with the plan and demonstrated an understanding of the instructions.   The patient was advised to call back or seek an in-person evaluation if the symptoms worsen or if the condition fails to improve as anticipated.  The above assessment and management plan was discussed with the patient. The patient verbalized understanding of and has agreed to the management plan. Patient is aware to call the clinic if symptoms persist or worsen. Patient is aware when to return to the clinic for a follow-up visit. Patient educated on when it is appropriate to go to the emergency department.   Time call ended: 10:50 AM  I provided 26 minutes of non-face-to-face time during this encounter.  Hendricks Limes, MSN, APRN, FNP-C [facility] 02/09/20

## 2020-02-28 ENCOUNTER — Ambulatory Visit: Payer: Medicaid Other | Admitting: Urology

## 2020-03-20 ENCOUNTER — Ambulatory Visit: Payer: Medicaid Other | Admitting: Family Medicine

## 2020-03-27 ENCOUNTER — Encounter: Payer: Self-pay | Admitting: Physician Assistant

## 2020-06-07 ENCOUNTER — Other Ambulatory Visit: Payer: Self-pay | Admitting: Family Medicine

## 2020-06-07 DIAGNOSIS — F411 Generalized anxiety disorder: Secondary | ICD-10-CM

## 2020-06-07 DIAGNOSIS — F39 Unspecified mood [affective] disorder: Secondary | ICD-10-CM

## 2020-06-21 ENCOUNTER — Ambulatory Visit (INDEPENDENT_AMBULATORY_CARE_PROVIDER_SITE_OTHER): Payer: Medicaid Other | Admitting: Family Medicine

## 2020-06-21 ENCOUNTER — Other Ambulatory Visit: Payer: Self-pay

## 2020-06-21 DIAGNOSIS — Z23 Encounter for immunization: Secondary | ICD-10-CM | POA: Diagnosis not present

## 2020-07-30 ENCOUNTER — Other Ambulatory Visit: Payer: Self-pay | Admitting: Family Medicine

## 2020-07-30 DIAGNOSIS — F39 Unspecified mood [affective] disorder: Secondary | ICD-10-CM

## 2020-07-30 DIAGNOSIS — F411 Generalized anxiety disorder: Secondary | ICD-10-CM

## 2020-07-30 NOTE — Telephone Encounter (Signed)
Alice Santiago NTBS 30 days given 06/07/20 

## 2020-08-13 ENCOUNTER — Ambulatory Visit: Payer: Medicaid Other | Admitting: Nurse Practitioner

## 2020-08-13 ENCOUNTER — Encounter: Payer: Self-pay | Admitting: Nurse Practitioner

## 2020-08-13 ENCOUNTER — Other Ambulatory Visit: Payer: Self-pay

## 2020-08-13 VITALS — BP 122/81 | HR 94 | Temp 98.0°F | Resp 20 | Ht 63.0 in | Wt 223.4 lb

## 2020-08-13 DIAGNOSIS — F411 Generalized anxiety disorder: Secondary | ICD-10-CM

## 2020-08-13 DIAGNOSIS — F39 Unspecified mood [affective] disorder: Secondary | ICD-10-CM

## 2020-08-13 DIAGNOSIS — M545 Low back pain, unspecified: Secondary | ICD-10-CM

## 2020-08-13 DIAGNOSIS — G8929 Other chronic pain: Secondary | ICD-10-CM | POA: Insufficient documentation

## 2020-08-13 MED ORDER — PREDNISONE 10 MG (21) PO TBPK: ORAL_TABLET | ORAL | 0 refills | Status: DC

## 2020-08-13 MED ORDER — NAPROXEN SODIUM 550 MG PO TABS: 550.0000 mg | ORAL_TABLET | Freq: Two times a day (BID) | ORAL | 0 refills | Status: DC

## 2020-08-13 MED ORDER — ARIPIPRAZOLE 5 MG PO TABS: 5.0000 mg | ORAL_TABLET | Freq: Every day | ORAL | 0 refills | Status: DC

## 2020-08-13 MED ORDER — DULOXETINE HCL 60 MG PO CPEP: 60.0000 mg | ORAL_CAPSULE | Freq: Every day | ORAL | 0 refills | Status: DC

## 2020-08-13 MED ORDER — CYCLOBENZAPRINE HCL 5 MG PO TABS: 5.0000 mg | ORAL_TABLET | Freq: Three times a day (TID) | ORAL | 0 refills | Status: DC | PRN

## 2020-08-13 NOTE — Assessment & Plan Note (Signed)
Generalized anxiety disorder well managed on current medication regimen no changes made to current dose.  Rx refill sent to pharmacy. Provided education with printed handouts given.  Follow-up with worsening or unresolved symptoms.

## 2020-08-13 NOTE — Assessment & Plan Note (Signed)
Patient has a history of back surgery and chronic back pain in the last 3 to 4 years.  Symptoms are not well controlled.  Depo-Medrol shot given, prednisone taper, naproxen, and Flexeril.  Follow-up in 4 to 6 weeks.  Provided education with printed handouts given.  Rx sent to pharmacy.

## 2020-08-13 NOTE — Progress Notes (Signed)
Established Patient Office Visit  Subjective:  Patient ID: Alice Santiago, female    DOB: [birth year]  Age: 39 y.o. MRN: [id number]  CC:  Chief Complaint  Patient presents with  . Medication Refill  . Back Pain    HPI Alice Santiago presents for Pain  She reports chronic back pain. was not an injury that may have caused the pain. The pain started a few years ago and is worsening. The pain does not radiate . The pain is described as aching, soreness and throbbing, is severe in intensity, occurring constantly. Symptoms are worse in the: all day  Aggravating factors: bending backwards, bending forwards and bending sideways Relieving factors: none.  She has tried application of heat, application of ice, NSAIDs and prescription pain relievers with mild relief    Anxiety: Patient complains of anxiety disorder.  She has the following symptoms: difficulty concentrating, fatigue, feelings of losing control, irritable, palpitations. Onset of symptoms was approximately 2 years ago, unchanged since that time. She denies current suicidal and homicidal ideation. Family history significant for depression.Possible organic causes contributing are: none. Risk factors: positive family history in  mother and previous episode of depression Previous treatment includes Abilify and none.  She complains of the following side effects from the treatment: none.  [name]   ---------------------------------------------------------------------------------------------------    Past Medical History:  Diagnosis Date  . Anxiety   . Asthma   . Back problem   . Breast tenderness in female 04/23/2015  . Bronchitis    currently being treated for bronchititis,no fever  . BV (bacterial vaginosis) 03/14/2015  . Depression   . History of chlamydia 04/23/2015  . Vaginal Pap smear, abnormal     Past Surgical History:  Procedure Laterality Date  . ABDOMINAL HYSTERECTOMY    . ANTERIOR CERVICAL DECOMP/DISCECTOMY FUSION   10/08/2017   Procedure: Anterior Cervical Decompression/Discectomy Fusion - Cervical five-Cervical six;  Surgeon: Tia Alert, MD;  Location: [facility] OR;  Service: Neurosurgery;;  . CESAREAN SECTION     times 2  . LUMBAR LAMINECTOMY/DECOMPRESSION MICRODISCECTOMY Left 11/10/2014   Procedure: LUMBAR LAMINECTOMY/DECOMPRESSION MICRODISCECTOMY LEFT LUMBAR FIVE-SACRAL ONE;  Surgeon: Tia Alert, MD;  Location: [facility];  Service: Neurosurgery;  Laterality: Left;  left  . TONSILLECTOMY    . TUBAL LIGATION      Family History  Problem Relation Age of Onset  . Depression Mother   . Anxiety disorder Mother   . Heart murmur Father   . Other Son        problems with bowels  . Asthma Son   . ADD / ADHD Son   . ODD Son   . Asthma Son   . Cancer Neg Hx     Social History   Socioeconomic History  . Marital status: Single    Spouse name: Not on file  . Number of children: 2  . Years of education: Not on file  . Highest education level: Not on file  Occupational History  . Not on file  Tobacco Use  . Smoking status: Current Every Day Smoker    Packs/day: 1.00    Years: 21.00    Pack years: 21.00    Types: Cigarettes  . Smokeless tobacco: Never Used  Vaping Use  . Vaping Use: Former  Substance and Sexual Activity  . Alcohol use: Yes    Comment: occ  . Drug use: Yes    Types: Marijuana  . Sexual activity: Not Currently    Birth control/protection:  Surgical    Comment: hyst  Other Topics Concern  . Not on file  Social History Narrative  . Not on file   Social Determinants of Health   Financial Resource Strain:   . Difficulty of Paying Living Expenses: Not on file  Food Insecurity:   . Worried About Programme researcher, broadcasting/film/video in the Last Year: Not on file  . Ran Out of Food in the Last Year: Not on file  Transportation Needs:   . Lack of Transportation (Medical): Not on file  . Lack of Transportation (Non-Medical): Not on file  Physical Activity:   . Days of Exercise per Week:  Not on file  . Minutes of Exercise per Session: Not on file  Stress:   . Feeling of Stress : Not on file  Social Connections:   . Frequency of Communication with Friends and Family: Not on file  . Frequency of Social Gatherings with Friends and Family: Not on file  . Attends Religious Services: Not on file  . Active Member of Clubs or Organizations: Not on file  . Attends Banker Meetings: Not on file  . Marital Status: Not on file  Intimate Partner Violence:   . Fear of Current or Ex-Partner: Not on file  . Emotionally Abused: Not on file  . Physically Abused: Not on file  . Sexually Abused: Not on file    Outpatient Medications Prior to Visit  Medication Sig Dispense Refill  . albuterol (PROAIR HFA) 108 (90 Base) MCG/ACT inhaler Inhale 2 puffs into the lungs every 6 (six) hours as needed for wheezing or shortness of breath. 18 g 2  . ARIPiprazole (ABILIFY) 5 MG tablet Take 1 tablet (5 mg total) by mouth daily. (Needs to be seen before next refill) 30 tablet 0  . DULoxetine (CYMBALTA) 60 MG capsule Take 1 capsule (60 mg total) by mouth daily. (Needs to be seen before next refill) 30 capsule 0  . EPINEPHrine 0.3 mg/0.3 mL IJ SOAJ injection Inject 0.3 mg into the muscle as needed.  (Patient not taking: Reported on 08/13/2020)  0  . hydrOXYzine (ATARAX/VISTARIL) 25 MG tablet Take 1 tablet (25 mg total) by mouth every 8 (eight) hours as needed for anxiety. (Patient not taking: Reported on 08/13/2020) 30 tablet 0  . ibuprofen (IBU) 800 MG tablet Take 1 tablet (800 mg total) by mouth every 8 (eight) hours as needed. (Patient not taking: Reported on 08/13/2020) 90 tablet 5  . nitroGLYCERIN (NITROSTAT) 0.4 MG SL tablet Place 0.4 mg under the tongue every 5 (five) minutes as needed for chest pain. (Patient not taking: Reported on 08/13/2020)    . DULoxetine (CYMBALTA) 30 MG capsule Take 1 capsule (30 mg total) by mouth daily for 14 days. 14 capsule 0   No facility-administered  medications prior to visit.    No Known Allergies  ROS Review of Systems  Musculoskeletal: Positive for back pain.  Psychiatric/Behavioral: Negative for agitation, behavioral problems, self-injury, sleep disturbance and suicidal ideas. The patient is nervous/anxious.   All other systems reviewed and are negative.     Objective:    Physical Exam Vitals reviewed.  Constitutional:      Appearance: Normal appearance. She is well-developed and well-groomed. She is obese.     Interventions: Face mask in place.  HENT:     Head: Normocephalic.     Nose: Nose normal.  Eyes:     Conjunctiva/sclera: Conjunctivae normal.  Cardiovascular:     Rate and Rhythm: Normal  rate and regular rhythm.  Pulmonary:     Effort: Pulmonary effort is normal.     Breath sounds: Normal breath sounds.  Abdominal:     General: Bowel sounds are normal.  Musculoskeletal:        General: Tenderness present.  Neurological:     Mental Status: She is alert and oriented to person, place, and time.  Psychiatric:        Behavior: Behavior is cooperative.     Comments: Anxiety     BP 122/81   Pulse 94   Temp 98 F (36.7 C) (Temporal)   Resp 20   Ht 5\' 3"  (1.6 m)   Wt 223 lb 6 oz (101.3 kg)   SpO2 97%   BMI 39.57 kg/m  Wt Readings from Last 3 Encounters:  08/13/20 223 lb 6 oz (101.3 kg)  01/08/20 214 lb (97.1 kg)  11/22/19 212 lb (96.2 kg)     Health Maintenance Due  Topic Date Due  . COVID-19 Vaccine (1) Never done    There are no preventive care reminders to display for this patient.  Lab Results  Component Value Date   TSH 1.460 08/23/2019   Lab Results  Component Value Date   WBC 8.8 01/08/2020   HGB 13.7 01/08/2020   HCT 41.5 01/08/2020   MCV 97.2 01/08/2020   PLT 268 01/08/2020   Lab Results  Component Value Date   NA 138 01/08/2020   K 4.1 01/08/2020   CO2 22 01/08/2020   GLUCOSE 114 (H) 01/08/2020   BUN 12 01/08/2020   CREATININE 0.64 01/08/2020   BILITOT <0.2  08/23/2019   ALKPHOS 97 08/23/2019   AST 26 08/23/2019   ALT 24 08/23/2019   PROT 6.8 08/23/2019   ALBUMIN 4.4 08/23/2019   CALCIUM 8.6 (L) 01/08/2020   ANIONGAP 7 01/08/2020    Lab Results  Component Value Date   HGBA1C 5.9 (H) 07/28/2014      Assessment & Plan:   Problem List Items Addressed This Visit      Other   Mood disorder (HCC)   Relevant Medications   DULoxetine (CYMBALTA) 60 MG capsule   ARIPiprazole (ABILIFY) 5 MG tablet   GAD (generalized anxiety disorder)    Generalized anxiety disorder well managed on current medication regimen no changes made to current dose.  Rx refill sent to pharmacy. Provided education with printed handouts given.  Follow-up with worsening or unresolved symptoms.        Relevant Medications   DULoxetine (CYMBALTA) 60 MG capsule   Chronic bilateral low back pain without sciatica - Primary    Patient has a history of back surgery and chronic back pain in the last 3 to 4 years.  Symptoms are not well controlled.  Depo-Medrol shot given, prednisone taper, naproxen, and Flexeril.  Follow-up in 4 to 6 weeks.  Provided education with printed handouts given.  Rx sent to pharmacy.      Relevant Medications   cyclobenzaprine (FLEXERIL) 5 MG tablet   predniSONE (STERAPRED UNI-PAK 21 TAB) 10 MG (21) TBPK tablet   naproxen sodium (ANAPROX DS) 550 MG tablet      Meds ordered this encounter  Medications  . DULoxetine (CYMBALTA) 60 MG capsule    Sig: Take 1 capsule (60 mg total) by mouth daily. (Needs to be seen before next refill)    Dispense:  90 capsule    Refill:  0  . ARIPiprazole (ABILIFY) 5 MG tablet    Sig: Take 1  tablet (5 mg total) by mouth daily.    Dispense:  90 tablet    Refill:  0  . cyclobenzaprine (FLEXERIL) 5 MG tablet    Sig: Take 1 tablet (5 mg total) by mouth 3 (three) times daily as needed for muscle spasms.    Dispense:  30 tablet    Refill:  0    Order Specific Question:   Supervising Provider    Answer:    Arville CareETTINGER, JOSHUA A [id number]]  . predniSONE (STERAPRED UNI-PAK 21 TAB) 10 MG (21) TBPK tablet    Sig: 6 tablet by mouth day 1, 5 tablets by mouth day 2, 4 tablets day 3, 3 tablets day 4, 2 tablet day 5, 1 tablet day 6.    Dispense:  1 each    Refill:  0    Order Specific Question:   Supervising Provider    Answer:   Arville CareETTINGER, JOSHUA A [id number]]  . naproxen sodium (ANAPROX DS) 550 MG tablet    Sig: Take 1 tablet (550 mg total) by mouth 2 (two) times daily with a meal.    Dispense:  30 tablet    Refill:  0    Order Specific Question:   Supervising Provider    Answer:   Arville CareETTINGER, JOSHUA A [[id number]]    Follow-up: Return if symptoms worsen or fail to improve.    Alice Drownnyeje M Khalise Billard, NP

## 2020-08-13 NOTE — Patient Instructions (Signed)
Acute Back Pain, Adult Acute back pain is sudden and usually short-lived. It is often caused by an injury to the muscles and tissues in the back. The injury may result from:  A muscle or ligament getting overstretched or torn (strained). Ligaments are tissues that connect bones to each other. Lifting something improperly can cause a back strain.  Wear and tear (degeneration) of the spinal disks. Spinal disks are circular tissue that provides cushioning between the bones of the spine (vertebrae).  Twisting motions, such as while playing sports or doing yard work.  A hit to the back.  Arthritis. You may have a physical exam, lab tests, and imaging tests to find the cause of your pain. Acute back pain usually goes away with rest and home care. Follow these instructions at home: Managing pain, stiffness, and swelling  Take over-the-counter and prescription medicines only as told by your health care provider.  Your health care provider may recommend applying ice during the first 24-48 hours after your pain starts. To do this: ? Put ice in a plastic bag. ? Place a towel between your skin and the bag. ? Leave the ice on for 20 minutes, 2-3 times a day.  If directed, apply heat to the affected area as often as told by your health care provider. Use the heat source that your health care provider recommends, such as a moist heat pack or a heating pad. ? Place a towel between your skin and the heat source. ? Leave the heat on for 20-30 minutes. ? Remove the heat if your skin turns bright red. This is especially important if you are unable to feel pain, heat, or cold. You have a greater risk of getting burned. Activity   Do not stay in bed. Staying in bed for more than 1-2 days can delay your recovery.  Sit up and stand up straight. Avoid leaning forward when you sit, or hunching over when you stand. ? If you work at a desk, sit close to it so you do not need to lean over. Keep your chin tucked  in. Keep your neck drawn back, and keep your elbows bent at a right angle. Your arms should look like the letter "L." ? Sit high and close to the steering wheel when you drive. Add lower back (lumbar) support to your car seat, if needed.  Take short walks on even surfaces as soon as you are able. Try to increase the length of time you walk each day.  Do not sit, drive, or stand in one place for more than 30 minutes at a time. Sitting or standing for long periods of time can put stress on your back.  Do not drive or use heavy machinery while taking prescription pain medicine.  Use proper lifting techniques. When you bend and lift, use positions that put less stress on your back: ? Bend your knees. ? Keep the load close to your body. ? Avoid twisting.  Exercise regularly as told by your health care provider. Exercising helps your back heal faster and helps prevent back injuries by keeping muscles strong and flexible.  Work with a physical therapist to make a safe exercise program, as recommended by your health care provider. Do any exercises as told by your physical therapist. Lifestyle  Maintain a healthy weight. Extra weight puts stress on your back and makes it difficult to have good posture.  Avoid activities or situations that make you feel anxious or stressed. Stress and anxiety increase muscle   tension and can make back pain worse. Learn ways to manage anxiety and stress, such as through exercise. General instructions  Sleep on a firm mattress in a comfortable position. Try lying on your side with your knees slightly bent. If you lie on your back, put a pillow under your knees.  Follow your treatment plan as told by your health care provider. This may include: ? Cognitive or behavioral therapy. ? Acupuncture or massage therapy. ? Meditation or yoga. Contact a health care provider if:  You have pain that is not relieved with rest or medicine.  You have increasing pain going down  into your legs or buttocks.  Your pain does not improve after 2 weeks.  You have pain at night.  You lose weight without trying.  You have a fever or chills. Get help right away if:  You develop new bowel or bladder control problems.  You have unusual weakness or numbness in your arms or legs.  You develop nausea or vomiting.  You develop abdominal pain.  You feel faint. Summary  Acute back pain is sudden and usually short-lived.  Use proper lifting techniques. When you bend and lift, use positions that put less stress on your back.  Take over-the-counter and prescription medicines and apply heat or ice as directed by your health care provider. This information is not intended to replace advice given to you by your health care provider. Make sure you discuss any questions you have with your health care provider. Document Revised: 12/14/2018 Document Reviewed: 04/08/2017 Elsevier Patient Education  2020 [employer]vier Inc[employer]  

## 2020-09-04 ENCOUNTER — Telehealth: Payer: Medicaid Other | Admitting: Physician Assistant

## 2020-09-04 DIAGNOSIS — M5440 Lumbago with sciatica, unspecified side: Secondary | ICD-10-CM

## 2020-09-04 NOTE — Progress Notes (Signed)
Based on what you shared with me, I feel your condition warrants further evaluation and I recommend that you be seen for a face to face office visit. With persistent and worsening pain and weakness after your fall you need a physical exam and possibly x-rays.  You may be seen at any of the below listed urgent care centers for further evaluation.    NOTE: If you entered your credit card information for this eVisit, you will not be charged. You may see a "hold" on your card for the $35 but that hold will drop off and you will not have a charge processed.   If you are having a true medical emergency please call 911.      For an urgent face to face visit, Alice Santiago has five urgent care centers for your convenience:     [facility] at [facility] Directions [phone] [address] [place], [place] [address] . 10 am - 6pm Monday - Friday    [facility]) Get Driving Directions [phone] [address] [place], [place] [address] . 10 am to 8 pm Monday-Friday . 12 pm to 8 pm [facility] Urgent Care at [facility] Get Driving Directions [phone] [address], [address] [place], [place] [address] . 8 am to 8 pm Monday-Friday . 9 am to 6 pm Saturday . 11 am to 6 pm Sunday     [facility] Urgent Care at [facility] Get Driving Directions  [phone] [address] [place], [place] [address] . 8 am to 8 pm Monday-Friday . 8 am to 4 pm [facility] Urgent Care at [facility] Directions [phone] [address] [place], [place] [address] . 12 pm to 6 pm Monday-Friday      Your e-visit answers were reviewed by a board certified advanced clinical practitioner to complete your personal care plan.  Thank you for using e-Visits.    Greater than 5 minutes, yet less than 10 minutes of time have been spent  researching, coordinating, and implementing care for this patient today

## 2020-09-06 ENCOUNTER — Encounter: Payer: Self-pay | Admitting: Nurse Practitioner

## 2020-09-10 ENCOUNTER — Encounter: Payer: Self-pay | Admitting: Family

## 2020-09-10 ENCOUNTER — Telehealth (INDEPENDENT_AMBULATORY_CARE_PROVIDER_SITE_OTHER): Payer: Medicaid Other | Admitting: Family

## 2020-09-10 ENCOUNTER — Ambulatory Visit (INDEPENDENT_AMBULATORY_CARE_PROVIDER_SITE_OTHER): Payer: Medicaid Other

## 2020-09-10 DIAGNOSIS — W19XXXA Unspecified fall, initial encounter: Secondary | ICD-10-CM

## 2020-09-10 DIAGNOSIS — Y92009 Unspecified place in unspecified non-institutional (private) residence as the place of occurrence of the external cause: Secondary | ICD-10-CM | POA: Diagnosis not present

## 2020-09-10 DIAGNOSIS — M5441 Lumbago with sciatica, right side: Secondary | ICD-10-CM

## 2020-09-10 MED ORDER — KETOROLAC TROMETHAMINE 60 MG/2ML IM SOLN
60.0000 mg | Freq: Once | INTRAMUSCULAR | Status: AC
  Administered 2020-09-10: 60 mg via INTRAMUSCULAR

## 2020-09-10 MED ORDER — METHYLPREDNISOLONE ACETATE 80 MG/ML IJ SUSP
80.0000 mg | Freq: Once | INTRAMUSCULAR | Status: AC
  Administered 2020-09-10: 80 mg via INTRAMUSCULAR

## 2020-09-10 NOTE — Progress Notes (Signed)
Virtual Visit via telephone Note Due to COVID-19 pandemic this visit was conducted virtually. This visit type was conducted due to national recommendations for restrictions regarding the COVID-19 Pandemic (e.g. social distancing, sheltering in place) in an effort to limit this patient's exposure and mitigate transmission in our community. All issues noted in this document were discussed and addressed.  A physical exam was not performed with this format.  I connected with Alice Santiago on 09/10/20 at 8:45 AM  by telephone and verified that I am speaking with the correct person using two identifiers. Alice Alice TREMAIN is currently located at home and no one is currently with her during visit. The provider, Jannifer Rodney, FNP is located in their office at time of visit.  I discussed the limitations, risks, security and privacy concerns of performing an evaluation and management service by telephone and the availability of in person appointments. I also discussed with the patient that there may be a patient responsible charge related to this service. The patient expressed understanding and agreed to proceed.   History and Present Illness:   Pt calls the office today with recurrent chronic back pain that radiates down her right hip and leg. She reports she was seen on 08/13/20 and given steroid injection that helped. However, two days after that she fell over a baby gate and fell the ground. Since then her back has been hurting.  Back Pain This is a chronic problem. The current episode started 1 to 4 weeks ago. The problem occurs intermittently. The pain is present in the lumbar spine. The quality of the pain is described as aching. The pain radiates to the right thigh. The pain is at a severity of 9/10. The pain is moderate. The symptoms are aggravated by standing and twisting. Pertinent negatives include no bladder incontinence, bowel incontinence, dysuria, pelvic pain, perianal numbness, tingling or  weakness. Risk factors include obesity. She has tried bed rest and NSAIDs for the symptoms. The treatment provided mild relief.      Review of Systems  Gastrointestinal: Negative for bowel incontinence.  Genitourinary: Negative for bladder incontinence, dysuria and pelvic pain.  Musculoskeletal: Positive for back pain.  Neurological: Negative for tingling and weakness.  All other systems reviewed and are negative.    Observations/Objective: No SOB or distress noted   Assessment and Plan: 1. Fall in home, initial encounter - methylPREDNISolone acetate (DEPO-MEDROL) injection 80 mg - ketorolac (TORADOL) injection 60 mg  2. Acute right-sided low back pain with right-sided sciatica - DG HIP UNILAT W OR W/O PELVIS 2-3 VIEWS RIGHT; Future - methylPREDNISolone acetate (DEPO-MEDROL) injection 80 mg - ketorolac (TORADOL) injection 60 mg  Pt does not want oral steroids as this gives her nightmares. Motrin as needed ROM exercises  X-ray pending RTO if symptoms worsen or do not improve   I discussed the assessment and treatment plan with the patient. The patient was provided an opportunity to ask questions and all were answered. The patient agreed with the plan and demonstrated an understanding of the instructions.   The patient was advised to call back or seek an in-person evaluation if the symptoms worsen or if the condition fails to improve as anticipated.  The above assessment and management plan was discussed with the patient. The patient verbalized understanding of and has agreed to the management plan. Patient is aware to call the clinic if symptoms persist or worsen. Patient is aware when to return to the clinic for a follow-up visit. Patient educated  on when it is appropriate to go to the emergency department.   Time call ended:  9:07 AM   I provided 22 minutes of non-face-to-face time during this encounter.    Jannifer Rodney, FNP

## 2020-09-28 ENCOUNTER — Other Ambulatory Visit: Payer: Self-pay | Admitting: Neurological Surgery

## 2020-09-28 DIAGNOSIS — M5416 Radiculopathy, lumbar region: Secondary | ICD-10-CM

## 2020-10-11 ENCOUNTER — Ambulatory Visit: Payer: Medicaid Other | Admitting: Family

## 2020-10-21 ENCOUNTER — Other Ambulatory Visit: Payer: Self-pay

## 2020-10-21 ENCOUNTER — Ambulatory Visit
Admission: RE | Admit: 2020-10-21 | Discharge: 2020-10-21 | Disposition: A | Discharge status: Home / Self Care | Payer: Medicaid Other | Source: Ambulatory Visit | Attending: Neurological Surgery | Admitting: Neurological Surgery

## 2020-10-21 DIAGNOSIS — M5416 Radiculopathy, lumbar region: Secondary | ICD-10-CM

## 2020-10-22 ENCOUNTER — Encounter: Payer: Self-pay | Admitting: Family Medicine

## 2021-01-11 ENCOUNTER — Other Ambulatory Visit: Payer: Self-pay

## 2021-01-11 ENCOUNTER — Ambulatory Visit: Payer: Medicaid Other | Admitting: Allergy & Immunology

## 2021-01-11 ENCOUNTER — Encounter: Payer: Self-pay | Admitting: Allergy & Immunology

## 2021-01-11 VITALS — BP 130/102 | HR 99 | Temp 98.1°F | Resp 18 | Ht 63.0 in | Wt 221.6 lb

## 2021-01-11 DIAGNOSIS — J302 Other seasonal allergic rhinitis: Secondary | ICD-10-CM

## 2021-01-11 DIAGNOSIS — F411 Generalized anxiety disorder: Secondary | ICD-10-CM

## 2021-01-11 DIAGNOSIS — J4540 Moderate persistent asthma, uncomplicated: Secondary | ICD-10-CM

## 2021-01-11 DIAGNOSIS — J3089 Other allergic rhinitis: Secondary | ICD-10-CM | POA: Diagnosis not present

## 2021-01-11 DIAGNOSIS — F39 Unspecified mood [affective] disorder: Secondary | ICD-10-CM

## 2021-01-11 MED ORDER — BUDESONIDE-FORMOTEROL FUMARATE 160-4.5 MCG/ACT IN AERO: 2.0000 | INHALATION_SPRAY | Freq: Two times a day (BID) | RESPIRATORY_TRACT | 5 refills | Status: DC

## 2021-01-11 MED ORDER — ALBUTEROL SULFATE (2.5 MG/3ML) 0.083% IN NEBU: 2.5000 mg | INHALATION_SOLUTION | RESPIRATORY_TRACT | 1 refills | Status: DC | PRN

## 2021-01-11 MED ORDER — FLUTICASONE PROPIONATE 50 MCG/ACT NA SUSP: 2.0000 | Freq: Every day | NASAL | 5 refills | Status: DC

## 2021-01-11 MED ORDER — CETIRIZINE HCL 10 MG PO TABS: 10.0000 mg | ORAL_TABLET | Freq: Every day | ORAL | 5 refills | Status: DC

## 2021-01-11 NOTE — Progress Notes (Signed)
NEW PATIENT  Date of Service/Encounter:  01/11/21  Consult requested by: Gwenlyn Fudge, FNP   Assessment:   Moderate persistent asthma, uncomplicated   Seasonal and perennial allergic rhinitis (grasses, ragweed, weeds, indoor molds, outdoor molds, dust mites and cat and mixed feathers)  Plan/Recommendations:   1. Moderate persistent asthma, uncomplicated - Lung testing was in the 70% range, but it did improve with the albuterol treatment. - We are going to get you back on track, but we need your help to do it!  - Neb and teaching provided and spacer use reviewed. - Daily controller medication(s): Symbicort 160/4.92mcg two puffs twice daily with spacer - Prior to physical activity: albuterol 2 puffs 10-15 minutes before physical activity. - Rescue medications: albuterol 4 puffs every 4-6 hours as needed - TRY to limit smoking to help improve your lung function and improve your quality of life (and save some $$$). - Asthma control goals:  * Full participation in all desired activities (may need albuterol before activity) * Albuterol use two time or less a week on average (not counting use with activity) * Cough interfering with sleep two time or less a month * Oral steroids no more than once a year * No hospitalizations  2. Seasonal and perennial allergic rhinitis - Testing today showed: grasses, ragweed, weeds, indoor molds, outdoor molds, dust mites and cat and mixed feathers - Copy of test results provided.  - Avoidance measures provided. - Start taking: Zyrtec (cetirizine)  tablet once daily and Flonase (fluticasone) two sprays per nostril daily - You can use an extra dose of the antihistamine, if needed, for breakthrough symptoms.  - Consider nasal saline rinses 1-2 times daily to remove allergens from the nasal cavities as well as help with mucous clearance (this is especially helpful to do before the nasal sprays are given) - Consider allergy shots as a means of  long-term control. - Allergy shots "re-train" and "reset" the immune system to ignore environmental allergens and decrease the resulting immune response to those allergens (sneezing, itchy watery eyes, runny nose, nasal congestion, etc).    - Allergy shots improve symptoms in 75-85% of patients.  - We can discuss more at the next appointment if the medications are not working for you.  3. Return in about 2 months (around 03/13/2021).     This note in its entirety was forwarded to the Provider who requested this consultation.  Subjective:   Alice Santiago is a 40 y.o. female presenting today for evaluation of  Chief Complaint  Patient presents with  . Asthma    Alice M Tedrick has a history of the following: Patient Active Problem List   Diagnosis Date Noted  . Chronic bilateral low back pain without sciatica 08/13/2020  . Difficulty sleeping 02/09/2020  . Overactive bladder 11/22/2019  . Stress incontinence, female 05/18/2019  . GAD (generalized anxiety disorder) 05/20/2018  . History of ovarian cyst 03/14/2018  . Cervical vertebral fusion 10/08/2017  . Perennial and seasonal allergic rhinitis 12/02/2016  . Allergic conjunctivitis 12/02/2016  . Moderate persistent asthma 12/02/2016  . History of chlamydia 04/23/2015  . BV (bacterial vaginosis) 03/14/2015  . S/P lumbar laminectomy 11/10/2014  . Mood disorder (HCC) 07/26/2013    History obtained from: chart review and patient.  Alice M Wilk was referred by Gwenlyn Fudge, FNP.     Alice is a 40 y.o. female presenting for an evaluation of allergic rhinitis. She was actually last seen in March 2018 by Dr.  Bobbitt, but this is only visit that she has had.  She did get 1 allergy injection but then never followed up.  She had testing that was positive to grasses, trees, dust mite, cat, ragweed, and dog.  She was diagnosed with moderate persistent asthma and was started on Flovent 110 mcg 2 puffs twice daily as well as montelukast  10 mg daily and albuterol.   Asthma/Respiratory Symptom History: She no longer takes the Flovent. She has a rescue inhaler that she uses as needed. She points to Ventolin. She has had this for a long time. She found it and used started using. She started using it again around last summer. She is out of it. She reports that she has SOB just from taking a shower. This has been going on since last summer.  When she gets up in the morning, she has coughing up of brown phlegm. Her last severe breathing issue was last summer and she was given a breathing treatment. She needs a new nebulizer machine.   Allergic Rhinitis Symptom History: She reports itchy watery eyes and runny nose throughout the year. She does not use anything in particular for her symptoms. Instead she just "deals with it". She was on cetirizine at one point but she has not gotten it at all. She has not talked to her PCP about this at all. She does not get antibiotics for sinus infections. She does not get antibiotics much at all.   Otherwise, there is no history of other atopic diseases, including food allergies, drug allergies, stinging insect allergies, eczema, urticaria or contact dermatitis. There is no significant infectious history. Vaccinations are up to date.    Past Medical History: Patient Active Problem List   Diagnosis Date Noted  . Chronic bilateral low back pain without sciatica 08/13/2020  . Difficulty sleeping 02/09/2020  . Overactive bladder 11/22/2019  . Stress incontinence, female 05/18/2019  . GAD (generalized anxiety disorder) 05/20/2018  . History of ovarian cyst 03/14/2018  . Cervical vertebral fusion 10/08/2017  . Perennial and seasonal allergic rhinitis 12/02/2016  . Allergic conjunctivitis 12/02/2016  . Moderate persistent asthma 12/02/2016  . History of chlamydia 04/23/2015  . BV (bacterial vaginosis) 03/14/2015  . S/P lumbar laminectomy 11/10/2014  . Mood disorder (HCC) 07/26/2013    Medication  List:  Allergies as of 01/11/2021   No Known Allergies     Medication List       Accurate as of Jan 11, 2021 11:54 AM. If you have any questions, ask your nurse or doctor.        albuterol 108 (90 Base) MCG/ACT inhaler Commonly known as: ProAir HFA Inhale 2 puffs into the lungs every 6 (six) hours as needed for wheezing or shortness of breath.   ARIPiprazole 5 MG tablet Commonly known as: ABILIFY Take 1 tablet (5 mg total) by mouth daily.   budesonide-formoterol 160-4.5 MCG/ACT inhaler Commonly known as: Symbicort Inhale 2 puffs into the lungs 2 (two) times daily. Started by: Alfonse Spruce, MD   cetirizine 10 MG tablet Commonly known as: ZYRTEC Take 1 tablet (10 mg total) by mouth daily. Started by: Alfonse Spruce, MD   cyclobenzaprine 5 MG tablet Commonly known as: FLEXERIL Take 1 tablet (5 mg total) by mouth 3 (three) times daily as needed for muscle spasms.   DULoxetine 60 MG capsule Commonly known as: CYMBALTA Take 1 capsule (60 mg total) by mouth daily. (Needs to be seen before next refill)   EPINEPHrine 0.3 mg/0.3 mL  Soaj injection Commonly known as: EPI-PEN Inject 0.3 mg into the muscle as needed.   fluticasone 50 MCG/ACT nasal spray Commonly known as: Flonase Place 2 sprays into both nostrils daily. Started by: Alfonse Spruce, MD   nitroGLYCERIN 0.4 MG SL tablet Commonly known as: NITROSTAT Place 0.4 mg under the tongue every 5 (five) minutes as needed for chest pain.       Birth History: non-contributory  Developmental History: non-contributory  Past Surgical History: Past Surgical History:  Procedure Laterality Date  . ABDOMINAL HYSTERECTOMY    . ANTERIOR CERVICAL DECOMP/DISCECTOMY FUSION  10/08/2017   Procedure: Anterior Cervical Decompression/Discectomy Fusion - Cervical five-Cervical six;  Surgeon: Tia Alert, MD;  Location: [facility] OR;  Service: Neurosurgery;;  . CESAREAN SECTION     times 2  . LUMBAR  LAMINECTOMY/DECOMPRESSION MICRODISCECTOMY Left 11/10/2014   Procedure: LUMBAR LAMINECTOMY/DECOMPRESSION MICRODISCECTOMY LEFT LUMBAR FIVE-SACRAL ONE;  Surgeon: Tia Alert, MD;  Location: [facility];  Service: Neurosurgery;  Laterality: Left;  left  . TONSILLECTOMY    . TUBAL LIGATION       Family History: Family History  Problem Relation Age of Onset  . Depression Mother   . Anxiety disorder Mother   . Heart murmur Father   . Other Son        problems with bowels  . Asthma Son   . ADD / ADHD Son   . ODD Son   . Asthma Son   . Cancer Neg Hx      Social History: Alice lives at home with her family. They live in a house of unknown age. There is no water damage in the home. There is electric hating with central cooling. There are two small dogs in the home. There are no dust mite coverings on the bedding. There is tobacco exposure (4 packs per week). She is not interested in stopping. She does not work outside of the home. She has two children who are teenagers.    Review of Systems  Constitutional: Negative.  Negative for fever, malaise/fatigue and weight loss.  HENT: Positive for congestion and sore throat. Negative for ear discharge and ear pain.        Positive for postnasal drip. Positive for sneezing.   Eyes: Negative for pain, discharge and redness.  Respiratory: Positive for cough, shortness of breath and wheezing. Negative for sputum production.   Cardiovascular: Negative.  Negative for chest pain and palpitations.  Gastrointestinal: Negative for abdominal pain, heartburn, nausea and vomiting.  Skin: Negative.  Negative for itching and rash.  Neurological: Negative for dizziness and headaches.  Endo/Heme/Allergies: Negative for environmental allergies. Does not bruise/bleed easily.       Objective:   Blood pressure (!) 130/102, pulse 99, temperature 98.1 F (36.7 C), temperature source Temporal, resp. rate 18, height 5\' 3"  (1.6 m), weight 221 lb 9.6 oz (100.5 kg),  SpO2 96 %. Body mass index is 39.25 kg/m.   Physical Exam:   Physical Exam Constitutional:      Appearance: She is well-developed. She is obese.  HENT:     Head: Normocephalic and atraumatic.     Right Ear: Tympanic membrane, ear canal and external ear normal. No drainage, swelling or tenderness. Tympanic membrane is not injected, scarred, erythematous, retracted or bulging.     Left Ear: Tympanic membrane, ear canal and external ear normal. No drainage, swelling or tenderness. Tympanic membrane is not injected, scarred, erythematous, retracted or bulging.     Nose: No nasal deformity,  septal deviation, mucosal edema or rhinorrhea.     Right Turbinates: Enlarged, swollen and pale.     Left Turbinates: Enlarged, swollen and pale.     Right Sinus: No maxillary sinus tenderness or frontal sinus tenderness.     Left Sinus: No maxillary sinus tenderness or frontal sinus tenderness.     Mouth/Throat:     Mouth: Mucous membranes are not pale and not dry.     Pharynx: Uvula midline.  Eyes:     General:        Right eye: No discharge.        Left eye: No discharge.     Conjunctiva/sclera: Conjunctivae normal.     Right eye: Right conjunctiva is not injected. No chemosis.    Left eye: Left conjunctiva is not injected. No chemosis.    Pupils: Pupils are equal, round, and reactive to light.  Cardiovascular:     Rate and Rhythm: Normal rate and regular rhythm.     Heart sounds: Normal heart sounds.  Pulmonary:     Effort: Pulmonary effort is normal. No tachypnea, accessory muscle usage or respiratory distress.     Breath sounds: Normal breath sounds. No wheezing, rhonchi or rales.     Comments: Minimal air movement at the bases. Coarse airway sounds throughout.  Chest:     Chest wall: No tenderness.  Abdominal:     Tenderness: There is no abdominal tenderness. There is no guarding or rebound.  Lymphadenopathy:     Head:     Right side of head: No submandibular, tonsillar or occipital  adenopathy.     Left side of head: No submandibular, tonsillar or occipital adenopathy.     Cervical: No cervical adenopathy.  Skin:    General: Skin is warm.     Capillary Refill: Capillary refill takes less than 2 seconds.     Coloration: Skin is not pale.     Findings: No abrasion, erythema, petechiae or rash. Rash is not papular, urticarial or vesicular.     Comments: No eczematous or urticarial lesions noted.   Neurological:     Mental Status: She is alert.      Diagnostic studies:   Spirometry: results normal (FEV1: 2.10/72%, FVC: 2.77/78%, FEV1/FVC: 76%).    Spirometry consistent with possible restrictive disease.   Allergy Studies:     Airborne Adult Perc - 01/11/21 1028    Time Antigen Placed 0945    Allergen Manufacturer Waynette Buttery    Location Back    Number of Test 59    1. Control-Buffer 50% Glycerol Negative    2. Control-Histamine 1 mg/ml 3+    3. Albumin saline Negative    4. Bahia Negative    5. French Southern Territ[place] Negative    6. Johnson Negative    7. Kentucky Blue Negative    8. Meadow Fescue Negative    9. Perennial Rye Negative    10. Sweet Vernal Negative    11. Timothy Negative    12. Cocklebur Negative    13. Burweed Marshelder Negative    14. Ragweed, short 3+    15. Ragweed, Giant Negative    16. Plantain,  English Negative    17. Lamb's Quarters Negative    18. Sheep Sorrell Negative    19. Rough Pigweed Negative    20. Marsh Elder, Rough Negative    21. Mugwort, Common Negative    22. Ash mix Negative    23. Birch mix Negative    24. Van Clines American Negative  25. Box, Elder Negative    26. Cedar, red Negative    27. Cottonwood, Guinea-Biss[place]astern Negative    28. Elm mix Negative    29. Hickory Negative    30. Maple mix Negative    31. Oak, Guinea[place] mix Negative    32. Pecan Pollen Negative    33. Pine mix Negative    34. Sycamore Eastern Negative    35. Walnut, Black Pollen Negative    36. Alternaria alternata Negative    37. Cladosporium Herbarum  Negative    38. Aspergillus mix Negative    39. Penicillium mix Negative    40. Bipolaris sorokiniana (Helminthosporium) Negative    41. Drechslera spicifera (Curvularia) Negative    42. Mucor plumbeus Negative    43. Fusarium moniliforme Negative    44. Aureobasidium pullulans (pullulara) Negative    45. Rhizopus oryzae Negative    46. Botrytis cinera Negative    47. Epicoccum nigrum Negative    48. Phoma betae Negative    49. Candida Albicans Negative    50. Trichophyton mentagrophytes Negative    51. Mite, D Farinae  5,000 AU/ml 4+    52. Mite, D Pteronyssinus  5,000 AU/ml 4+    53. Cat Hair 10,000 BAU/ml 4+    54.  Dog Epithelia Negative    55. Mixed Feathers 3+    56. Horse Epithelia Negative    57. Cockroach, German Negative    58. Mouse Negative    59. Tobacco Leaf Negative          Intradermal - 01/11/21 1001    Time Antigen Placed 1001    Allergen Manufacturer Waynette ButteryGreer    Location Arm    Number of Test 12    Control Negative    French Southern Territ[place] Negative    Johnson Negative    7 Grass 1+    Weed mix 2+    Tree mix Negative    Mold 1 1+    Mold 2 Negative    Mold 3 Negative    Mold 4 2+    Dog Negative    Cockroach Negative           Allergy testing results were read and interpreted by myself, documented by clinical staff.         Malachi BondsJoel Beckem Tomberlin, MD [facility]

## 2021-01-11 NOTE — Patient Instructions (Addendum)
1. Moderate persistent asthma, uncomplicated - Lung testing was in the 70% range, but it did improve with the albuterol treatment. - We are going to get you back on track, but we need your help to do it!  - Neb and teaching provided and spacer use reviewed. - Daily controller medication(s): Symbicort 160/4.19mcg two puffs twice daily with spacer - Prior to physical activity: albuterol 2 puffs 10-15 minutes before physical activity. - Rescue medications: albuterol 4 puffs every 4-6 hours as needed - TRY to limit smoking to help improve your lung function and improve your quality of life (and save some $$$). - Asthma control goals:  * Full participation in all desired activities (may need albuterol before activity) * Albuterol use two time or less a week on average (not counting use with activity) * Cough interfering with sleep two time or less a month * Oral steroids no more than once a year * No hospitalizations  2. Seasonal and perennial allergic rhinitis - Testing today showed: grasses, ragweed, weeds, indoor molds, outdoor molds, dust mites and cat and mixed feathers - Copy of test results provided.  - Avoidance measures provided. - Start taking: Zyrtec (cetirizine) 10mg  tablet once daily and Flonase (fluticasone) two sprays per nostril daily - You can use an extra dose of the antihistamine, if needed, for breakthrough symptoms.  - Consider nasal saline rinses 1-2 times daily to remove allergens from the nasal cavities as well as help with mucous clearance (this is especially helpful to do before the nasal sprays are given) - Consider allergy shots as a means of long-term control. - Allergy shots "re-train" and "reset" the immune system to ignore environmental allergens and decrease the resulting immune response to those allergens (sneezing, itchy watery eyes, runny nose, nasal congestion, etc).    - Allergy shots improve symptoms in 75-85% of patients.  - We can discuss more at the next  appointment if the medications are not working for you.  3. Return in about 2 months (around 03/13/2021).    Please inform 05/14/2021 of any Emergency Department visits, hospitalizations, or changes in symptoms. Call us before going to the ED for breathing or allergy symptoms since we might be able to fit you in for a sick visit. Feel free to contact us anytime with any questions, problems, or concerns.  It was a pleasure to meet you today!  Websites that have reliable patient information: 1. American Academy of Asthma, Allergy, and Immunology: www.aaaai.org 2. Food Allergy Research and Education (FARE): foodallergy.org 3. Mothers of Asthmatics: http://www.asthmacommunitynetwork.org 4. American College of Allergy, Asthma, and Immunology: www.acaai.org   COVID-19 Vaccine Information can be found at: [place] For questions related to vaccine distribution or appointments, please email [email or web address] or call [phone].   We realize that you might be concerned about having an allergic reaction to the COVID19 vaccines. To help with that concern, WE ARE OFFERING THE COVID19 VACCINES IN OUR OFFICE! Ask the front desk for dates!     "Like" [phone] on Facebook and Instagram for our latest updates!      A healthy democracy works best when [place] participate! Make sure you are registered to vote! If you have moved or changed any of your contact information, you will need to get this updated before voting!  In some cases, you MAY be able to register to vote online: [employer]    1. Control-Buffer 50% Glycerol Negative   2. Control-Histamine 1 mg/ml 3+   3. Albumin saline Negative  4. Bahia Negative   5. [place] Negative   6. Johnson Negative   7. [place] Blue Negative   8. Meadow Fescue Negative   9. Perennial Rye Negative   10. Sweet Vernal Negative   11. Timothy Negative   12.  Cocklebur Negative   13. Burweed Marshelder Negative   14. Ragweed, short 3+   15. Ragweed, Giant Negative   16. Plantain,  English Negative   17. Lamb's Quarters Negative   18. Sheep Sorrell Negative   19. Rough Pigweed Negative   20. Marsh Elder, Rough Negative   21. Mugwort, Common Negative   22. Ash mix Negative   23. Birch mix Negative   24. Beech American Negative   25. Box, Elder Negative   26. Cedar, red Negative   27. Cottonwood, Guinea-Bissau Negative   28. Elm mix Negative   29. Hickory Negative   30. Maple mix Negative   31. Oak, Guinea[place] mix Negative   32. Pecan Pollen Negative   33. Pine mix Negative   34. Sycamore Eastern Negative   35. Walnut, Black Pollen Negative   36. Alternaria alternata Negative   37. Cladosporium Herbarum Negative   38. Aspergillus mix Negative   39. Penicillium mix Negative   40. Bipolaris sorokiniana (Helminthosporium) Negative   41. Drechslera spicifera (Curvularia) Negative   42. Mucor plumbeus Negative   43. Fusarium moniliforme Negative   44. Aureobasidium pullulans (pullulara) Negative   45. Rhizopus oryzae Negative   46. Botrytis cinera Negative   47. Epicoccum nigrum Negative   48. Phoma betae Negative   49. Candida Albicans Negative   50. Trichophyton mentagrophytes Negative   51. Mite, D Farinae  5,000 AU/ml 4+   52. Mite, D Pteronyssinus  5,000 AU/ml 4+   53. Cat Hair 10,000 BAU/ml 4+   54.  Dog Epithelia Negative   55. Mixed Feathers 3+   56. Horse Epithelia Negative   57. Cockroach, German Negative   58. Mouse Negative   59. Tobacco Leaf Negative     [place] Negative   Johnson Negative   7 Grass 1+   Weed mix 2+   Tree mix Negative   Mold 1 1+   Mold 2 Negative   Mold 3 Negative   Mold 4 2+   Dog Negative   Cockroach Negative     Reducing Pollen Exposure  The American Academy of Allergy, Asthma and Immunology suggests the following steps to reduce your exposure to pollen during allergy seasons.    1. Do  not hang sheets or clothing out to dry; pollen may collect on these items. 2. Do not mow lawns or spend time around freshly cut grass; mowing stirs up pollen. 3. Keep windows closed at night.  Keep car windows closed while driving. 4. Minimize morning activities outdoors, a time when pollen counts are usually at their highest. 5. Stay indoors as much as possible when pollen counts or humidity is high and on windy days when pollen tends to remain in the air longer. 6. Use air conditioning when possible.  Many air conditioners have filters that trap the pollen spores. 7. Use a HEPA room air filter to remove pollen form the indoor air you breathe.  Control of Mold Allergen   Mold and fungi can grow on a variety of surfaces provided certain temperature and moisture conditions exist.  Outdoor molds grow on plants, decaying vegetation and soil.  The major outdoor mold, Alternaria and Cladosporium, are found in very high numbers  during hot and dry conditions.  Generally, a late Summer - Fall peak is seen for common outdoor fungal spores.  Rain will temporarily lower outdoor mold spore count, but counts rise rapidly when the rainy period ends.  The most important indoor molds are Aspergillus and Penicillium.  Dark, humid and poorly ventilated basements are ideal sites for mold growth.  The next most common sites of mold growth are the bathroom and the kitchen.  Outdoor (Seasonal) Mold Control  Positive outdoor molds via skin testing: Alternaria and Cladosporium  1. Use air conditioning and keep windows closed 2. Avoid exposure to decaying vegetation. 3. Avoid leaf raking. 4. Avoid grain handling. 5. Consider wearing a face mask if working in moldy areas.  6.   Indoor (Perennial) Mold Control   Positive indoor molds via skin testing: Fusarium, Aureobasidium (Pullulara) and Rhizopus  1. Maintain humidity below 50%. 2. Clean washable surfaces with 5% bleach solution. 3. Remove sources e.g.  contaminated carpets.     Control of Dust Mite Allergen    Dust mites play a major role in allergic asthma and rhinitis.  They occur in environments with high humidity wherever human skin is found.  Dust mites absorb humidity from the atmosphere (ie, they do not drink) and feed on organic matter (including shed human and animal skin).  Dust mites are a microscopic type of insect that you cannot see with the naked eye.  High levels of dust mites have been detected from mattresses, pillows, carpets, upholstered furniture, bed covers, clothes, soft toys and any woven material.  The principal allergen of the dust mite is found in its feces.  A gram of dust may contain 1,000 mites and 250,000 fecal particles.  Mite antigen is easily measured in the air during house cleaning activities.  Dust mites do not bite and do not cause harm to humans, other than by triggering allergies/asthma.    Ways to decrease your exposure to dust mites in your home:  1. Encase mattresses, box springs and pillows with a mite-impermeable barrier or cover   2. Wash sheets, blankets and drapes weekly in hot water (130 F) with detergent and dry them in a dryer on the hot setting.  3. Have the room cleaned frequently with a vacuum cleaner and a damp dust-mop.  For carpeting or rugs, vacuuming with a vacuum cleaner equipped with a high-efficiency particulate air (HEPA) filter.  The dust mite allergic individual should not be in a room which is being cleaned and should wait 1 hour after cleaning before going into the room. 4. Do not sleep on upholstered furniture (eg, couches).   5. If possible removing carpeting, upholstered furniture and drapery from the home is ideal.  Horizontal blinds should be eliminated in the rooms where the person spends the most time (bedroom, study, television room).  Washable vinyl, roller-type shades are optimal. 6. Remove all non-washable stuffed toys from the bedroom.  Wash stuffed toys weekly like  sheets and blankets above.   7. Reduce indoor humidity to less than 50%.  Inexpensive humidity monitors can be purchased at most hardware stores.  Do not use a humidifier as can make the problem worse and are not recommended.  Control of Dog or Cat Allergen  Avoidance is the best way to manage a dog or cat allergy. If you have a dog or cat and are allergic to dog or cats, consider removing the dog or cat from the home. If you have a dog or cat but  don't want to find it a new home, or if your family wants a pet even though someone in the household is allergic, here are some strategies that may help keep symptoms at bay:  1. Keep the pet out of your bedroom and restrict it to only a few rooms. Be advised that keeping the dog or cat in only one room will not limit the allergens to that room. 2. Don't pet, hug or kiss the dog or cat; if you do, wash your hands with soap and water. 3. High-efficiency particulate air (HEPA) cleaners run continuously in a bedroom or living room can reduce allergen levels over time. 4. Regular use of a high-efficiency vacuum cleaner or a central vacuum can reduce allergen levels. 5. Giving your dog or cat a bath at least once a week can reduce airborne allergen.

## 2021-01-22 DIAGNOSIS — H40033 Anatomical narrow angle, bilateral: Secondary | ICD-10-CM | POA: Diagnosis not present

## 2021-01-22 DIAGNOSIS — H5213 Myopia, bilateral: Secondary | ICD-10-CM | POA: Diagnosis not present

## 2021-01-29 ENCOUNTER — Encounter: Payer: Self-pay | Admitting: Family Medicine

## 2021-01-29 ENCOUNTER — Ambulatory Visit: Payer: Medicaid Other | Admitting: Family Medicine

## 2021-02-11 ENCOUNTER — Telehealth: Payer: Medicaid Other | Admitting: Emergency Medicine

## 2021-02-11 DIAGNOSIS — R300 Dysuria: Secondary | ICD-10-CM

## 2021-02-11 DIAGNOSIS — R1030 Lower abdominal pain, unspecified: Secondary | ICD-10-CM

## 2021-02-11 NOTE — Progress Notes (Signed)
Based on what you shared with me, difficulty passing urine and belly pain, I feel your condition warrants further evaluation and I recommend that you be seen in a face to face office visit by your primary care provider or at an urgent care.  It appears you had similar symptoms 07/30/2020 and were treated for a UTI but had a normal urine test at that time.  It is recommended you have a further evaluation for today's symptoms to help determine the most appropriate treatment for you.   NOTE: If you entered your credit card information for this eVisit, you will not be charged. You may see a "hold" on your card for the $35 but that hold will drop off and you will not have a charge processed.   If you are having a true medical emergency please call 911.      For an urgent face to face visit, [facility] has six urgent care centers for your convenience:     [facility] at [facility] Directions [phone] [address] [place], [place] [address] . 8 am - 4 pm Monday - Friday    [facility]) Get Driving Directions [phone] [address] [place], [place] [address] . 8 am to 8 pm Monday-Friday . 10 am to 6 pm [facility]) Get Driving Directions [phone]  [address] [place],  [place]  [address] . 8 am to 8 pm Monday-Friday . 8 am to 4 pm [facility] Care at [facility] Get Driving Directions [phone] [address] [place], [place] [address] . 8 am to 8 pm Monday-Friday . 8 am to 4 pm [facility] Urgent Care at [facility] Get Driving Directions  [phone] [address] [place], [place] [address] . 8 am to 8 pm Monday-Friday . 8 am to 4 pm [facility] Urgent Care at [facility]  Directions [phone] [address] [place], [place] [address] . 8 am to 8 pm Monday-Friday . 8 am to 4 pm Saturday-Sunday     Your MyChart E-visit questionnaire answers were reviewed by a board certified advanced clinical practitioner to complete your personal care plan based on your specific symptoms.  Thank you for using e-Visits.    Approximately 5 minutes was spent documenting and reviewing patient's chart.

## 2021-04-02 ENCOUNTER — Encounter: Payer: Self-pay | Admitting: Family Medicine

## 2021-04-02 ENCOUNTER — Telehealth: Payer: Self-pay | Admitting: Family Medicine

## 2021-04-02 ENCOUNTER — Other Ambulatory Visit: Payer: Self-pay

## 2021-04-02 ENCOUNTER — Ambulatory Visit: Payer: Medicaid Other | Admitting: Family Medicine

## 2021-04-02 VITALS — BP 132/88 | HR 91 | Temp 97.8°F | Resp 96 | Ht 63.0 in | Wt 217.0 lb

## 2021-04-02 DIAGNOSIS — Z8249 Family history of ischemic heart disease and other diseases of the circulatory system: Secondary | ICD-10-CM | POA: Diagnosis not present

## 2021-04-02 DIAGNOSIS — F411 Generalized anxiety disorder: Secondary | ICD-10-CM | POA: Diagnosis not present

## 2021-04-02 DIAGNOSIS — J4540 Moderate persistent asthma, uncomplicated: Secondary | ICD-10-CM | POA: Diagnosis not present

## 2021-04-02 DIAGNOSIS — Z23 Encounter for immunization: Secondary | ICD-10-CM | POA: Diagnosis not present

## 2021-04-02 DIAGNOSIS — R232 Flushing: Secondary | ICD-10-CM | POA: Diagnosis not present

## 2021-04-02 DIAGNOSIS — Z9189 Other specified personal risk factors, not elsewhere classified: Secondary | ICD-10-CM | POA: Diagnosis not present

## 2021-04-02 DIAGNOSIS — N3946 Mixed incontinence: Secondary | ICD-10-CM

## 2021-04-02 DIAGNOSIS — Z6838 Body mass index (BMI) 38.0-38.9, adult: Secondary | ICD-10-CM | POA: Diagnosis not present

## 2021-04-02 DIAGNOSIS — E6609 Other obesity due to excess calories: Secondary | ICD-10-CM

## 2021-04-02 LAB — CMP14+EGFR
ALT: 14 IU/L (ref 0–32)
AST: 18 IU/L (ref 0–40)
Albumin/Globulin Ratio: 2 (ref 1.2–2.2)
Albumin: 4.5 g/dL (ref 3.8–4.8)
Alkaline Phosphatase: 102 IU/L (ref 44–121)
BUN/Creatinine Ratio: 11 (ref 9–23)
BUN: 8 mg/dL (ref 6–24)
Bilirubin Total: <0.2 mg/dL (ref 0.0–1.2)
CO2: 24 mmol/L (ref 20–29)
Calcium: 9.3 mg/dL (ref 8.7–10.2)
Chloride: 104 mmol/L (ref 96–106)
Creatinine, Ser: 0.72 mg/dL (ref 0.57–1.00)
Globulin, Total: 2.3 g/dL (ref 1.5–4.5)
Glucose: 110 mg/dL — ABNORMAL HIGH (ref 65–99)
Potassium: 4.5 mmol/L (ref 3.5–5.2)
Sodium: 141 mmol/L (ref 134–144)
Total Protein: 6.8 g/dL (ref 6.0–8.5)
eGFR: 108 mL/min/{1.73_m2} (ref >59)

## 2021-04-02 LAB — CBC WITH DIFFERENTIAL/PLATELET
Basophils Absolute: 0.1 10*3/uL (ref 0.0–0.2)
Basos: 1 %
EOS (ABSOLUTE): 0.1 10*3/uL (ref 0.0–0.4)
Eos: 1 %
Hematocrit: 43.6 % (ref 34.0–46.6)
Hemoglobin: 14.7 g/dL (ref 11.1–15.9)
Immature Grans (Abs): 0 10*3/uL (ref 0.0–0.1)
Immature Granulocytes: 0 %
Lymphocytes Absolute: 2.3 10*3/uL (ref 0.7–3.1)
Lymphs: 30 %
MCH: 31.5 pg (ref 26.6–33.0)
MCHC: 33.7 g/dL (ref 31.5–35.7)
MCV: 94 fL (ref 79–97)
Monocytes Absolute: 0.5 10*3/uL (ref 0.1–0.9)
Monocytes: 7 %
Neutrophils Absolute: 4.6 10*3/uL (ref 1.4–7.0)
Neutrophils: 61 %
Platelets: 315 10*3/uL (ref 150–450)
RBC: 4.66 x10E6/uL (ref 3.77–5.28)
RDW: 12.9 % (ref 11.7–15.4)
WBC: 7.6 10*3/uL (ref 3.4–10.8)

## 2021-04-02 LAB — LIPID PANEL
Chol/HDL Ratio: 5.4 ratio — ABNORMAL HIGH (ref 0.0–4.4)
Cholesterol, Total: 206 mg/dL — ABNORMAL HIGH (ref 100–199)
HDL: 38 mg/dL — ABNORMAL LOW (ref >39)
LDL Chol Calc (NIH): 123 mg/dL — ABNORMAL HIGH (ref 0–99)
Triglycerides: 255 mg/dL — ABNORMAL HIGH (ref 0–149)
VLDL Cholesterol Cal: 45 mg/dL — ABNORMAL HIGH (ref 5–40)

## 2021-04-02 LAB — TSH: TSH: 1.14 u[IU]/mL (ref 0.450–4.500)

## 2021-04-02 MED ORDER — MIRABEGRON ER 25 MG PO TB24: 25.0000 mg | ORAL_TABLET | Freq: Every day | ORAL | 2 refills | Status: DC

## 2021-04-02 MED ORDER — FLUOXETINE HCL 20 MG PO CAPS: 20.0000 mg | ORAL_CAPSULE | Freq: Every day | ORAL | 2 refills | Status: DC

## 2021-04-02 NOTE — Telephone Encounter (Signed)
There are definitely side effects to any medication we choose.  Should she experience any unwanted side effects, she needs to let me know.  She was not controlled on her previous medications, so I do not feel like we need to go back to them.  I hope she will be agreeing to trying something new so that we can get control of her anxiety.  Please let me know.  I did send Myrbetriq for her urination.

## 2021-04-02 NOTE — Telephone Encounter (Signed)
Patient aware and will try medication.  She will let us know if she experiences any worsening side effects.

## 2021-04-02 NOTE — Telephone Encounter (Signed)
Patient picked up Prozac prescription but after reading side effects she is scared to start taking because it states it can cause increased anxiety and insomnia.  She would like to possibly increase the dosage of the medication she was taking instead.  Also, she is having frequent urination and thought you were sending in something for her.

## 2021-04-02 NOTE — Progress Notes (Signed)
Assessment & Plan:  1. GAD (generalized anxiety disorder) Uncontrolled. Started Prozac. Patient declined prescription for hydroxyzine for as needed use as she reports it does not work for her. Education provided on mindfulness based stress reduction.  - FLUoxetine (PROZAC) 20 MG capsule; Take 1 capsule (20 mg total) by mouth daily.  Dispense: 30 capsule; Refill: 2 - CMP14+EGFR  2. Risk factors for obstructive sleep apnea - Ambulatory referral to Pulmonology  3. Mixed stress and urge urinary incontinence Uncontrolled. Discussed medications and pelvic health physical therapy. Patient prefers medication. - mirabegron ER (MYRBETRIQ) 25 MG TB24 tablet; Take 1 tablet (25 mg total) by mouth daily.  Dispense: 30 tablet; Refill: 2  4. Moderate persistent asthma without complication Uncontrolled. Encouraged daily use of Symbicort as prescribed. - Pneumococcal polysaccharide vaccine 23-valent greater than or equal to 2yo subcutaneous/IM - given in office.  5. Hot flashes Started Prozac for anxiety which may help with hot flashes. - FLUoxetine (PROZAC) 20 MG capsule; Take 1 capsule (20 mg total) by mouth daily.  Dispense: 30 capsule; Refill: 2 - TSH  6. Class 2 obesity due to excess calories without serious comorbidity with body mass index (BMI) of 38.0 to 38.9 in adult - CBC with Differential/Platelet - CMP14+EGFR - Lipid panel - Ambulatory referral to Pulmonology  7. Family history of heart disease - Ambulatory referral to Cardiology  8. Immunization due - Pneumococcal polysaccharide vaccine 23-valent greater than or equal to 2yo subcutaneous/IM - given in office.   Return in about 6 weeks (around 05/14/2021) for Anxiety, asthma, & incontinence.  Hendricks Limes, MSN, APRN, FNP-C [facility]  Subjective:    Patient ID: Alice Santiago, female    DOB: 04-07-1981, 40 y.o.   MRN: [id number]  Patient Care Team: Loman Brooklyn, FNP as PCP - General (Family  Medicine)   Chief Complaint:  Chief Complaint  Patient presents with   Medication Refill    HPI: Alice Santiago is a 40 y.o. female presenting on 04/02/2021 for Medication Refill  Anxiety: patient does not feel her anxiety was controlled when she was taking Ability 5 mg daily and Cymbalta 60 mg daily. She has been out of her medications for a few months now. She has previously failed treatment with BuSpar, Remeron, Effexor, and Zoloft. She states the only medication that has helped in the past is Xanax. She failed a urine drug screen on 08/23/2019 and her prescription was stopped.   Depression screen [facility] 2/9 04/02/2021 08/13/2020 08/23/2019  Decreased Interest _0 Down, Depressed, Hopeless _1 PHQ - 2 Score _2 Altered sleeping _3 Tired, decreased energy _4 Change in appetite _5 Feeling bad or failure about yourself  _6 Trouble concentrating _7 Moving slowly or fidgety/restless 0 0 1  Suicidal thoughts 0 0 0  PHQ-9 Score _8 Difficult doing work/chores Not difficult at all - Somewhat difficult   When patient reports she is not sleeping she states it is because she has to keep getting up to urinate. Also states when she gets up her mouth is really dry. Upon further questions, she does snore and had a man friend tell her recently that she is stopping breathing at night.  She is experiencing incontinent episodes with trying to hold urine too long and if she coughs or sneezes.   GAD 7 : Generalized Anxiety Score 04/02/2021  08/23/2019  Nervous, Anxious, on Edge 3 2  Control/stop worrying 0 3  Worry too much - different things 0 3  Trouble relaxing 0 2  Restless 0 2  Easily annoyed or irritable 1 3  Afraid - awful might happen 0 0  Total GAD 7 Score 4 15  Anxiety Difficulty Very difficult Somewhat difficult    Asthma: patient is not using her Symbicort. She is requiring Albuterol 1-2x/day.   Patient reports she keeps having hot flashes.  Patient  would like a referral to cardiology for a full cardiac work-up given her family history of heart disease.    Social history:  Relevant past medical, surgical, family and social history reviewed and updated as indicated. Interim medical history since our last visit reviewed.  Allergies and medications reviewed and updated.  DATA REVIEWED: CHART IN EPIC  ROS: Negative unless specifically indicated above in HPI.    Current Outpatient Medications:    albuterol (PROAIR HFA) 108 (90 Base) MCG/ACT inhaler, Inhale 2 puffs into the lungs every 6 (six) hours as needed for wheezing or shortness of breath., Disp: 18 g, Rfl: 2   albuterol (PROVENTIL) (2.5 MG/3ML) 0.083% nebulizer solution, Take 3 mLs (2.5 mg total) by nebulization every 4 (four) hours as needed for wheezing or shortness of breath., Disp: 100 mL, Rfl: 1   ARIPiprazole (ABILIFY) 5 MG tablet, Take 1 tablet (5 mg total) by mouth daily., Disp: 90 tablet, Rfl: 0   budesonide-formoterol (SYMBICORT) 160-4.5 MCG/ACT inhaler, Inhale 2 puffs into the lungs 2 (two) times daily., Disp: 1 each, Rfl: 5   cetirizine (ZYRTEC) 10 MG tablet, Take 1 tablet (10 mg total) by mouth daily., Disp: 30 tablet, Rfl: 5   DULoxetine (CYMBALTA) 60 MG capsule, Take 1 capsule (60 mg total) by mouth daily. (Needs to be seen before next refill), Disp: 90 capsule, Rfl: 0   EPINEPHrine 0.3 mg/0.3 mL IJ SOAJ injection, Inject 0.3 mg into the muscle as needed., Disp: , Rfl: 0   fluticasone (FLONASE) 50 MCG/ACT nasal spray, Place 2 sprays into both nostrils daily., Disp: 1 g, Rfl: 5   nitroGLYCERIN (NITROSTAT) 0.4 MG SL tablet, Place 0.4 mg under the tongue every 5 (five) minutes as needed for chest pain., Disp: , Rfl:    No Known Allergies Past Medical History:  Diagnosis Date   Anxiety    Asthma    Back problem    Breast tenderness in female 04/23/2015   Bronchitis    currently being treated for bronchititis,no fever   BV (bacterial vaginosis) 03/14/2015   Depression     History of chlamydia 04/23/2015   Vaginal Pap smear, abnormal     Past Surgical History:  Procedure Laterality Date   ABDOMINAL HYSTERECTOMY     ANTERIOR CERVICAL DECOMP/DISCECTOMY FUSION  10/08/2017   Procedure: Anterior Cervical Decompression/Discectomy Fusion - Cervical five-Cervical six;  Surgeon: Eustace Moore, MD;  Location: [facility];  Service: Neurosurgery;;   CESAREAN SECTION     times 2   LUMBAR LAMINECTOMY/DECOMPRESSION MICRODISCECTOMY Left 11/10/2014   Procedure: LUMBAR LAMINECTOMY/DECOMPRESSION MICRODISCECTOMY LEFT LUMBAR FIVE-SACRAL ONE;  Surgeon: Eustace Moore, MD;  Location: [facility];  Service: Neurosurgery;  Laterality: Left;  left   TONSILLECTOMY     TUBAL LIGATION      Social History   Socioeconomic History   Marital status: Single    Spouse name: Not on file   Number of children: 2   Years of education: Not on file   Highest education  level: Not on file  Occupational History   Not on file  Tobacco Use   Smoking status: Every Day    Packs/day: 1.00    Years: 21.00    Pack years: 21.00    Types: Cigarettes   Smokeless tobacco: Never  Vaping Use   Vaping Use: Former  Substance and Sexual Activity   Alcohol use: Yes    Comment: occ   Drug use: Yes    Types: Marijuana   Sexual activity: Not Currently    Birth control/protection: Surgical    Comment: hyst  Other Topics Concern   Not on file  Social History Narrative   Not on file   Social Determinants of Health   Financial Resource Strain: Not on file  Food Insecurity: Not on file  Transportation Needs: Not on file  Physical Activity: Not on file  Stress: Not on file  Social Connections: Not on file  Intimate Partner Violence: Not on file        Objective:    BP 132/88   Pulse 91   Temp 97.8 F (36.6 C) (Temporal)   Resp (!) 96   Ht _0  (1.6 m)   Wt 217 lb (98.4 kg)   HC 20" (50.8 cm)   BMI 38.44 kg/m   Wt Readings from Last 3 Encounters:  04/02/21 217 lb (98.4 kg)  01/11/21  221 lb 9.6 oz (100.5 kg)  08/13/20 223 lb 6 oz (101.3 kg)    Physical Exam Vitals reviewed.  Constitutional:      General: She is not in acute distress.    Appearance: Normal appearance. She is obese. She is not ill-appearing, toxic-appearing or diaphoretic.  HENT:     Head: Normocephalic and atraumatic.  Eyes:     General: No scleral icterus.       Right eye: No discharge.        Left eye: No discharge.     Conjunctiva/sclera: Conjunctivae normal.  Cardiovascular:     Rate and Rhythm: Normal rate and regular rhythm.     Heart sounds: Normal heart sounds. No murmur heard.   No friction rub. No gallop.  Pulmonary:     Effort: Pulmonary effort is normal. No respiratory distress.     Breath sounds: Normal breath sounds. No stridor. No wheezing, rhonchi or rales.  Musculoskeletal:        General: Normal range of motion.     Cervical back: Normal range of motion.  Skin:    General: Skin is warm and dry.     Capillary Refill: Capillary refill takes less than 2 seconds.  Neurological:     General: No focal deficit present.     Mental Status: She is alert and oriented to person, place, and time. Mental status is at baseline.  Psychiatric:        Mood and Affect: Mood normal.        Behavior: Behavior normal.        Thought Content: Thought content normal.        Judgment: Judgment normal.    Lab Results  Component Value Date   TSH 1.460 08/23/2019   Lab Results  Component Value Date   WBC 8.8 01/08/2020   HGB 13.7 01/08/2020   HCT 41.5 01/08/2020   MCV 97.2 01/08/2020   PLT 268 01/08/2020   Lab Results  Component Value Date   NA 138 01/08/2020   K 4.1 01/08/2020   CO2 22 01/08/2020   GLUCOSE 114 (H)  01/08/2020   BUN 12 01/08/2020   CREATININE 0.64 01/08/2020   BILITOT <0.2 08/23/2019   ALKPHOS 97 08/23/2019   AST 26 08/23/2019   ALT 24 08/23/2019   PROT 6.8 08/23/2019   ALBUMIN 4.4 08/23/2019   CALCIUM 8.6 (L) 01/08/2020   ANIONGAP 7 01/08/2020   No results  found for: CHOL No results found for: HDL No results found for: LDLCALC No results found for: TRIG No results found for: [facility] Lab Results  Component Value Date   HGBA1C 5.9 (H) 07/28/2014

## 2021-04-03 ENCOUNTER — Encounter: Payer: Self-pay | Admitting: Family Medicine

## 2021-04-03 ENCOUNTER — Telehealth: Payer: Self-pay | Admitting: Family Medicine

## 2021-04-03 DIAGNOSIS — E782 Mixed hyperlipidemia: Secondary | ICD-10-CM | POA: Insufficient documentation

## 2021-04-03 DIAGNOSIS — F411 Generalized anxiety disorder: Secondary | ICD-10-CM

## 2021-04-03 HISTORY — DX: Mixed hyperlipidemia: E78.2

## 2021-04-03 MED ORDER — DULOXETINE HCL 30 MG PO CPEP: ORAL_CAPSULE | ORAL | 2 refills | Status: DC

## 2021-04-03 MED ORDER — ARIPIPRAZOLE 5 MG PO TABS: 5.0000 mg | ORAL_TABLET | Freq: Every evening | ORAL | 1 refills | Status: DC

## 2021-04-03 NOTE — Telephone Encounter (Signed)
Addressed in My Chart message

## 2021-04-07 DIAGNOSIS — H5213 Myopia, bilateral: Secondary | ICD-10-CM | POA: Diagnosis not present

## 2021-04-09 ENCOUNTER — Telehealth: Payer: Self-pay | Admitting: *Deleted

## 2021-04-09 DIAGNOSIS — N3946 Mixed incontinence: Secondary | ICD-10-CM

## 2021-04-09 MED ORDER — OXYBUTYNIN CHLORIDE ER 5 MG PO TB24: 5.0000 mg | ORAL_TABLET | Freq: Every day | ORAL | 2 refills | Status: DC

## 2021-04-09 NOTE — Telephone Encounter (Signed)
Alice came in for Myrbetriq 25MG  er tablets Key:    Recommendations:  Fesoterodine Fumarate ER Not Required Oxybutynin Chloride Not Required Oxybutynin Chloride ER Not Required Solifenacin Succinate Not Required  Will cont . With Alice Santiago TRACKS   Pt has Medicaid and this will not be covered until she tries 2 of the following first:  Oxybutynin syrup Oxybutynin ER TAB Solifenacin Tab Toviaz Tab

## 2021-04-09 NOTE — Telephone Encounter (Signed)
Please let patient know I changed the medication to something her insurance would cover.

## 2021-04-09 NOTE — Telephone Encounter (Signed)
Patient aware and verbalizes understanding. 

## 2021-04-12 ENCOUNTER — Ambulatory Visit: Payer: Medicaid Other | Admitting: Allergy & Immunology

## 2021-04-18 ENCOUNTER — Other Ambulatory Visit: Payer: Self-pay | Admitting: Family Medicine

## 2021-04-18 DIAGNOSIS — Z1231 Encounter for screening mammogram for malignant neoplasm of breast: Secondary | ICD-10-CM

## 2021-04-24 ENCOUNTER — Other Ambulatory Visit: Payer: Self-pay

## 2021-05-16 ENCOUNTER — Encounter: Payer: Self-pay | Admitting: Pulmonary Disease

## 2021-05-16 ENCOUNTER — Other Ambulatory Visit: Payer: Self-pay

## 2021-05-16 ENCOUNTER — Ambulatory Visit (INDEPENDENT_AMBULATORY_CARE_PROVIDER_SITE_OTHER): Payer: Medicaid Other | Admitting: Pulmonary Disease

## 2021-05-16 VITALS — BP 120/80 | HR 103 | Temp 98.2°F | Ht 63.5 in | Wt 222.0 lb

## 2021-05-16 DIAGNOSIS — G4733 Obstructive sleep apnea (adult) (pediatric): Secondary | ICD-10-CM | POA: Diagnosis not present

## 2021-05-16 NOTE — Progress Notes (Signed)
Alice Santiago    [id number]    12-05-1980  Primary Care Physician:Joyce, Robin Searing, FNP  Referring Physician: Gwenlyn Fudge, FNP [address] [place],  [place] [address]  Chief complaint:   Patient being seen for concern for obstructive sleep apnea  HPI:  History of snoring, dry mouth Wakes up feeling nonrestorative Does not feel like she sleeps well every night  Usually able to fall asleep easily A few awakenings Final wake up time between 8 and 9 AM  Memory is fair, occasionally forgetful Wakes up to drink a lot of water during the night because of dryness Gasping respirations at night  History of asthma History of hypercholesterolemia  Does not recollect if parents snored   Outpatient Encounter Medications as of 05/16/2021  Medication Sig   albuterol (PROAIR HFA) 108 (90 Base) MCG/ACT inhaler Inhale 2 puffs into the lungs every 6 (six) hours as needed for wheezing or shortness of breath.   albuterol (PROVENTIL) (2.5 MG/3ML) 0.083% nebulizer solution Take 3 mLs (2.5 mg total) by nebulization every 4 (four) hours as needed for wheezing or shortness of breath.   ARIPiprazole (ABILIFY) 5 MG tablet Take 1 tablet (5 mg total) by mouth at bedtime.   budesonide-formoterol (SYMBICORT) 160-4.5 MCG/ACT inhaler Inhale 2 puffs into the lungs 2 (two) times daily.   DULoxetine (CYMBALTA) 30 MG capsule Take 30 mg (1 capsule) by mouth daily x1 week, then increase to 60 mg (2 capsules) daily.   No facility-administered encounter medications on file as of 05/16/2021.    Allergies as of 05/16/2021   (No Known Allergies)    Past Medical History:  Diagnosis Date   Anxiety    Asthma    Back problem    Breast tenderness in female 04/23/2015   Bronchitis    currently being treated for bronchititis,no fever   BV (bacterial vaginosis) 03/14/2015   Depression    History of chlamydia 04/23/2015   Mixed hyperlipidemia 04/03/2021   Vaginal Pap smear, abnormal     Past  Surgical History:  Procedure Laterality Date   ANTERIOR CERVICAL DECOMP/DISCECTOMY FUSION  10/08/2017   Procedure: Anterior Cervical Decompression/Discectomy Fusion - Cervical five-Cervical six;  Surgeon: Tia Alert, MD;  Location: [facility] OR;  Service: Neurosurgery;;   CESAREAN SECTION     times 2   LUMBAR LAMINECTOMY/DECOMPRESSION MICRODISCECTOMY Left 11/10/2014   Procedure: LUMBAR LAMINECTOMY/DECOMPRESSION MICRODISCECTOMY LEFT LUMBAR FIVE-SACRAL ONE;  Surgeon: Tia Alert, MD;  Location: [facility];  Service: Neurosurgery;  Laterality: Left;  left   TONSILLECTOMY     TOTAL ABDOMINAL HYSTERECTOMY     TUBAL LIGATION      Family History  Problem Relation Age of Onset   Depression Mother    Anxiety disorder Mother    Heart murmur Father    Other Son        problems with bowels   Asthma Son    ADD / ADHD Son    ODD Son    Asthma Son    Cancer Neg Hx     Social History   Socioeconomic History   Marital status: Single    Spouse name: Not on file   Number of children: 2   Years of education: Not on file   Highest education level: Not on file  Occupational History   Not on file  Tobacco Use   Smoking status: Every Day    Packs/day: 1.00    Years: 21.00  Pack years: 21.00    Types: Cigarettes   Smokeless tobacco: Never  Vaping Use   Vaping Use: Former  Substance and Sexual Activity   Alcohol use: Yes    Comment: occ   Drug use: Yes    Types: Marijuana   Sexual activity: Not Currently    Birth control/protection: Surgical    Comment: hyst  Other Topics Concern   Not on file  Social History Narrative   Not on file   Social Determinants of Health   Financial Resource Strain: Not on file  Food Insecurity: Not on file  Transportation Needs: Not on file  Physical Activity: Not on file  Stress: Not on file  Social Connections: Not on file  Intimate Partner Violence: Not on file    Review of Systems  Constitutional:  Positive for fatigue.  Respiratory:   Negative for shortness of breath.   Psychiatric/Behavioral:  Positive for sleep disturbance.    Vitals:   05/16/21 1558  BP: 120/80  Pulse: (!) 103  Temp: 98.2 F (36.8 C)  SpO2: 97%     Physical Exam Constitutional:      Appearance: She is obese.  HENT:     Head: Normocephalic.     Mouth/Throat:     Mouth: Mucous membranes are moist.  Cardiovascular:     Rate and Rhythm: Normal rate and regular rhythm.     Heart sounds: No murmur heard.   No friction rub.  Pulmonary:     Effort: No respiratory distress.     Breath sounds: No stridor. No wheezing or rhonchi.  Neurological:     Mental Status: She is alert.  Psychiatric:        Mood and Affect: Mood normal.     Data Reviewed: Recent spirometry 01/11/2021 shows no significant obstructive disease  Assessment:  High probability of significant obstructive sleep apnea  Active smoker  Obesity  Nonrestorative sleep  Pathophysiology of sleep disordered breathing discussed with the patient Treatment options discussed with the patient  Plan/Recommendations: We will schedule patient for home sleep study  Graded exercise as  Weight loss efforts encouraged  Smoking cessation counseling  Tentative follow-up in about 3 to 4 months   Virl Diamond MD [facility] 05/16/2021, 4:27 PM  CC: Gwenlyn Fudge, FNP

## 2021-05-16 NOTE — Patient Instructions (Signed)
Moderate likelihood of significant obstructive sleep apnea  We will schedule you for home sleep study Will update you with results as soon as reviewed  Tentative follow-up in about 3 to 4 months  Call with significant concerns  Sleep Apnea Sleep apnea affects breathing during sleep. It causes breathing to stop for 10 seconds or more, or to become shallow. People with sleep apnea usually snore loudly. It can also increase the risk of: Heart attack. Stroke. Being very overweight (obese). Diabetes. Heart failure. Irregular heartbeat. High blood pressure. The goal of treatment is to help you breathe normally again. What are the causes? The most common cause of this condition is a collapsed or blocked airway. There are three kinds of sleep apnea: Obstructive sleep apnea. This is caused by a blocked or collapsed airway. Central sleep apnea. This happens when the brain does not send the right signals to the muscles that control breathing. Mixed sleep apnea. This is a combination of obstructive and central sleep apnea. What increases the risk? Being overweight. Smoking. Having a small airway. Being older. Being female. Drinking alcohol. Taking medicines to calm yourself (sedatives or tranquilizers). Having family members with the condition. Having a tongue or tonsils that are larger than normal. What are the signs or symptoms? Trouble staying asleep. Loud snoring. Headaches in the morning. Waking up gasping. Dry mouth or sore throat in the morning. Being sleepy or tired during the day. If you are sleepy or tired during the day, you may also: Not be able to focus your mind (concentrate). Forget things. Get angry a lot and have mood swings. Feel sad (depressed). Have changes in your personality. Have less interest in sex, if you are female. Be unable to have an erection, if you are female. How is this treated?  Sleeping on your side. Using a medicine to get rid of mucus in  your nose (decongestant). Avoiding the use of alcohol, medicines to help you relax, or certain pain medicines (narcotics). Losing weight, if needed. Changing your diet. Quitting smoking. Using a machine to open your airway while you sleep, such as: An oral appliance. This is a mouthpiece that shifts your lower jaw forward. A CPAP device. This device blows air through a mask when you breathe out (exhale). An EPAP device. This has valves that you put in each nostril. A BPAP device. This device blows air through a mask when you breathe in (inhale) and breathe out. Having surgery if other treatments do not work. Follow these instructions at home: Lifestyle Make changes that your doctor recommends. Eat a healthy diet. Lose weight if needed. Avoid alcohol, medicines to help you relax, and some pain medicines. Do not smoke or use any products that contain nicotine or tobacco. If you need help quitting, ask your doctor. General instructions Take over-the-counter and prescription medicines only as told by your doctor. If you were given a machine to use while you sleep, use it only as told by your doctor. If you are having surgery, make sure to tell your doctor you have sleep apnea. You may need to bring your device with you. Keep all follow-up visits. Contact a doctor if: The machine that you were given to use during sleep bothers you or does not seem to be working. You do not get better. You get worse. Get help right away if: Your chest hurts. You have trouble breathing in enough air. You have an uncomfortable feeling in your back, arms, or stomach. You have trouble talking. One side  of your body feels weak. A part of your face is hanging down. These symptoms may be an emergency. Get help right away. Call your local emergency services (911 in the [place] Do not wait to see if the symptoms will go away. Do not drive yourself to the hospital. Summary This condition affects breathing during  sleep. The most common cause is a collapsed or blocked airway. The goal of treatment is to help you breathe normally while you sleep. This information is not intended to replace advice given to you by your health care provider. Make sure you discuss any questions you have with your health care provider. Document Revised: 08/03/2020 Document Reviewed: 08/03/2020 Elsevier Patient Education  2022 [employer].

## 2021-05-17 ENCOUNTER — Telehealth: Payer: Self-pay | Admitting: Cardiology

## 2021-05-17 ENCOUNTER — Ambulatory Visit (INDEPENDENT_AMBULATORY_CARE_PROVIDER_SITE_OTHER): Payer: Medicaid Other | Admitting: Cardiology

## 2021-05-17 ENCOUNTER — Encounter: Payer: Self-pay | Admitting: Cardiology

## 2021-05-17 VITALS — BP 134/80 | HR 88 | Ht 63.0 in | Wt 224.6 lb

## 2021-05-17 DIAGNOSIS — E782 Mixed hyperlipidemia: Secondary | ICD-10-CM

## 2021-05-17 DIAGNOSIS — Z8249 Family history of ischemic heart disease and other diseases of the circulatory system: Secondary | ICD-10-CM | POA: Diagnosis not present

## 2021-05-17 DIAGNOSIS — R0602 Shortness of breath: Secondary | ICD-10-CM | POA: Diagnosis not present

## 2021-05-17 DIAGNOSIS — Z720 Tobacco use: Secondary | ICD-10-CM | POA: Diagnosis not present

## 2021-05-17 NOTE — Progress Notes (Signed)
Cardiology Office Note  Date: 05/17/2021   ID: Alice Santiago, DOB [birth year], MRN [id number]  PCP:  Gwenlyn Fudge, FNP  Cardiologist:  Nona Dell, MD Electrophysiologist:  None   Chief Complaint  Patient presents with   Shortness of Breath     History of Present Illness: Alice Santiago is a 40 y.o. female referred for cardiology consultation by Ms. Alona Bene NP with [facility] due to reported family history of heart disease.  She states that she has been concerned about the status of her heart.  Her father died at age 40 with a heart attack and reportedly had history of heart murmur as well.  She reports chronic shortness of breath with activity over the last year, worse with inclines, feels lightheaded if she pushes herself too hard.  Per chart review she had a visit with [facility] yesterday for evaluation of OSA.  Further testing is planned beginning with home sleep study.  I personally reviewed her ECG today which shows sinus rhythm with low voltage.  We went over her medications, she reports compliance with therapy, does use MDIs with history of asthma, but not severe.  She has not undergone any prior cardiac structural or ischemic testing.  Past Medical History:  Diagnosis Date   Anxiety    Asthma    Back problem    Breast tenderness in female 04/23/2015   Bronchitis    BV (bacterial vaginosis) 03/14/2015   Depression    History of chlamydia 04/23/2015   Mixed hyperlipidemia 04/03/2021   Vaginal Pap smear, abnormal     Past Surgical History:  Procedure Laterality Date   ANTERIOR CERVICAL DECOMP/DISCECTOMY FUSION  10/08/2017   Procedure: Anterior Cervical Decompression/Discectomy Fusion - Cervical five-Cervical six;  Surgeon: Tia Alert, MD;  Location: [facility] OR;  Service: Neurosurgery;;   CESAREAN SECTION     times 2   LUMBAR LAMINECTOMY/DECOMPRESSION MICRODISCECTOMY Left 11/10/2014   Procedure: LUMBAR LAMINECTOMY/DECOMPRESSION MICRODISCECTOMY LEFT LUMBAR  FIVE-SACRAL ONE;  Surgeon: Tia Alert, MD;  Location: [facility];  Service: Neurosurgery;  Laterality: Left;  left   TONSILLECTOMY     TOTAL ABDOMINAL HYSTERECTOMY     TUBAL LIGATION      Current Outpatient Medications  Medication Sig Dispense Refill   albuterol (PROAIR HFA) 108 (90 Base) MCG/ACT inhaler Inhale 2 puffs into the lungs every 6 (six) hours as needed for wheezing or shortness of breath. 18 g 2   albuterol (PROVENTIL) (2.5 MG/3ML) 0.083% nebulizer solution Take 3 mLs (2.5 mg total) by nebulization every 4 (four) hours as needed for wheezing or shortness of breath. 100 mL 1   ARIPiprazole (ABILIFY) 5 MG tablet Take 1 tablet (5 mg total) by mouth at bedtime. 90 tablet 1   budesonide-formoterol (SYMBICORT) 160-4.5 MCG/ACT inhaler Inhale 2 puffs into the lungs 2 (two) times daily. 1 each 5   DULoxetine (CYMBALTA) 30 MG capsule Take 30 mg (1 capsule) by mouth daily x1 week, then increase to 60 mg (2 capsules) daily. 60 capsule 2   No current facility-administered medications for this visit.   Allergies:  Patient has no known allergies.   Social History: The patient  reports that she has been smoking cigarettes. She has a 21.00 pack-year smoking history. She has never used smokeless tobacco. She reports current alcohol use. She reports current drug use. Drug: Marijuana.   Family History: The patient's family history includes ADD / ADHD in her son; Anxiety disorder in her mother; Asthma in her  son and son; Depression in her mother; Heart murmur in her father; ODD in her son; Other in her son.   ROS: No orthopnea or PND.  Physical Exam: VS:  BP 134/80   Pulse 88   Ht 5\' 3"  (1.6 m)   Wt 224 lb 9.6 oz (101.9 kg)   SpO2 96%   BMI 39.79 kg/m , BMI Body mass index is 39.79 kg/m.  Wt Readings from Last 3 Encounters:  05/17/21 224 lb 9.6 oz (101.9 kg)  05/16/21 222 lb (100.7 kg)  04/02/21 217 lb (98.4 kg)    General: Patient appears comfortable at rest. HEENT: Conjunctiva  and lids normal, wearing a mask. Neck: Supple, no elevated JVP or carotid bruits, no thyromegaly. Lungs: Clear to auscultation, nonlabored breathing at rest. Cardiac: Regular rate and rhythm, no S3 or significant systolic murmur, no pericardial rub. Abdomen: Soft, nontender, bowel sounds present. Extremities: No pitting edema, distal pulses 2+. Skin: Warm and dry. Musculoskeletal: No kyphosis. Neuropsychiatric: Alert and oriented x3, affect grossly appropriate.  ECG:  An ECG dated 01/08/2020 was personally reviewed today and demonstrated:  Sinus rhythm.  Recent Labwork: 04/02/2021: ALT 14; AST 18; BUN 8; Creatinine, Ser 0.72; Hemoglobin 14.7; Platelets 315; Potassium 4.5; Sodium 141; TSH 1.140     Component Value Date/Time   CHOL 206 (H) 04/02/2021 1048   TRIG 255 (H) 04/02/2021 1048   HDL 38 (L) 04/02/2021 1048   CHOLHDL 5.4 (H) 04/02/2021 1048   LDLCALC 123 (H) 04/02/2021 1048    Other Studies Reviewed Today:  Chest x-ray 01/08/2020: FINDINGS: The heart size and mediastinal contours are within normal limits. Both lungs are clear. The visualized skeletal structures are unremarkable.   IMPRESSION: No acute abnormality of the lungs.  Assessment and Plan:  1.  Dyspnea on exertion and fatigue in a 41 year old woman with family history of premature CAD in her father (died at age 35), longstanding tobacco abuse, total cholesterol of 206 with LDL 123 and estimated 10-year CVD risk of approximately 6%.  ECG nonspecific showing sinus rhythm with low voltage.  She has not undergone any prior cardiac structural or ischemic testing.  Plan to proceed with an echocardiogram and exercise Myoview.  2.  Mild mixed hyperlipidemia with LDL 123.  Estimated 10-year CVD risk is approximately 6%.  Discussed diet and exercise, would not be unreasonable to consider moderate intensity statin therapy for aggressive control.  3.  Longstanding tobacco abuse.  We did discuss smoking cessation today.  4.  At  risk for OSA with work-up in process.  Medication Adjustments/Labs and Tests Ordered: Current medicines are reviewed at length with the patient today.  Concerns regarding medicines are outlined above.   Tests Ordered: Orders Placed This Encounter  Procedures   NM Myocar Multi W/Spect W/Wall Motion / EF   EKG 12-Lead   ECHOCARDIOGRAM COMPLETE     Medication Changes: No orders of the defined types were placed in this encounter.   Disposition:  Follow up  test results.  Signed, 26, MD, [facility] 05/17/2021 11:02 AM    [facility] at [facility] [address] [place], [place], [place] Phone: [phone]; Fax: [phone]

## 2021-05-17 NOTE — Patient Instructions (Signed)
Medication Instructions:  Your physician recommends that you continue on your current medications as directed. Please refer to the Current Medication list given to you today.  *If you need a refill on your cardiac medications before your next appointment, please call your pharmacy*   Lab Work: None If you have labs (blood work) drawn today and your tests are completely normal, you will receive your results only by: MyChart Message (if you have MyChart) OR A paper copy in the mail If you have any lab test that is abnormal or we need to change your treatment, we will call you to review the results.   Testing/Procedures: Your physician has requested that you have an echocardiogram. Echocardiography is a painless test that uses sound waves to create images of your heart. It provides your doctor with information about the size and shape of your heart and how well your heart's chambers and valves are working. This procedure takes approximately one hour. There are no restrictions for this procedure. Your physician has requested that you have en exercise stress myoview. For further information please visit [email or web address] Please follow instruction sheet, as given.    Follow-Up: At [facility], you and your health needs are our priority.  As part of our continuing mission to provide you with exceptional heart care, we have created designated Provider Care Teams.  These Care Teams include your primary Cardiologist (physician) and Advanced Practice Providers (APPs -  Physician Assistants and Nurse Practitioners) who all work together to provide you with the care you need, when you need it.  We recommend signing up for the patient portal called "MyChart".  Sign up information is provided on this After Visit Summary.  MyChart is used to connect with patients for Virtual Visits (Telemedicine).  Patients are able to view lab/test results, encounter notes, upcoming appointments, etc.  Non-urgent  messages can be sent to your provider as well.   To learn more about what you can do with MyChart, go to [email or web address].    Your next appointment:   Follow Up Appointment: Pending Test Results   Other Instructions

## 2021-05-17 NOTE — Telephone Encounter (Signed)
Checking percert on the following patient for testing scheduled at [facility].    EXERCISE MYOVIEW    05-30-2021  ECHO    06/10/2021

## 2021-05-20 ENCOUNTER — Other Ambulatory Visit: Payer: Self-pay

## 2021-05-20 ENCOUNTER — Ambulatory Visit
Admission: RE | Admit: 2021-05-20 | Discharge: 2021-05-20 | Disposition: A | Discharge status: Home / Self Care | Payer: Medicaid Other | Source: Ambulatory Visit | Attending: Family Medicine | Admitting: Family Medicine

## 2021-05-20 DIAGNOSIS — Z1231 Encounter for screening mammogram for malignant neoplasm of breast: Secondary | ICD-10-CM | POA: Diagnosis not present

## 2021-05-30 ENCOUNTER — Encounter (HOSPITAL_COMMUNITY)
Admission: RE | Admit: 2021-05-30 | Discharge: 2021-05-30 | Disposition: A | Discharge status: Home / Self Care | Payer: Medicaid Other | Source: Ambulatory Visit | Attending: Cardiology | Admitting: Cardiology

## 2021-05-30 ENCOUNTER — Other Ambulatory Visit: Payer: Self-pay

## 2021-05-30 ENCOUNTER — Ambulatory Visit (HOSPITAL_COMMUNITY)
Admission: RE | Admit: 2021-05-30 | Discharge: 2021-05-30 | Disposition: A | Discharge status: Home / Self Care | Payer: Medicaid Other | Source: Ambulatory Visit | Attending: Cardiology | Admitting: Cardiology

## 2021-05-30 DIAGNOSIS — R0602 Shortness of breath: Secondary | ICD-10-CM | POA: Insufficient documentation

## 2021-05-30 DIAGNOSIS — Z8249 Family history of ischemic heart disease and other diseases of the circulatory system: Secondary | ICD-10-CM | POA: Insufficient documentation

## 2021-05-30 LAB — NM MYOCAR MULTI W/SPECT W/WALL MOTION / EF
LV dias vol: 62 mL (ref 46–106)
LV sys vol: 20 mL
Nuc Stress EF: 68 %
Peak HR: 123 {beats}/min
RATE: 0.3
Rest HR: 76 {beats}/min
Rest Nuclear Isotope Dose: 10.4 mCi
SDS: 0
SRS: 1
SSS: 1
ST Depression (mm): 0 mm
Stress Nuclear Isotope Dose: 30 mCi
TID: 1.05

## 2021-05-30 MED ORDER — REGADENOSON 0.4 MG/5ML IV SOLN
INTRAVENOUS | Status: AC
  Administered 2021-05-30: 0.4 mg via INTRAVENOUS
  Filled 2021-05-30: qty 5

## 2021-05-30 MED ORDER — TECHNETIUM TC 99M TETROFOSMIN IV KIT
10.0000 | PACK | Freq: Once | INTRAVENOUS | Status: AC | PRN
  Administered 2021-05-30: 10.4 via INTRAVENOUS

## 2021-05-30 MED ORDER — TECHNETIUM TC 99M TETROFOSMIN IV KIT
30.0000 | PACK | Freq: Once | INTRAVENOUS | Status: AC | PRN
  Administered 2021-05-30: 30 via INTRAVENOUS

## 2021-05-30 MED ORDER — SODIUM CHLORIDE FLUSH 0.9 % IV SOLN
INTRAVENOUS | Status: AC
  Administered 2021-05-30: 10 mL via INTRAVENOUS
  Filled 2021-05-30: qty 10

## 2021-06-05 ENCOUNTER — Telehealth: Payer: Self-pay | Admitting: *Deleted

## 2021-06-05 NOTE — Telephone Encounter (Signed)
-----   Message from Jonelle Sidle, MD sent at 05/30/2021  8:29 PM EDT ----- Results reviewed.  Please let her know that the stress test was reassuring, normal perfusion and normal LVEF.  This argues against obstructive CAD.  Echocardiogram is pending, would follow-up on these results.  In terms of reducing her cardiac risk, smoking cessation is certainly recommended, and she may consider speaking with PCP about starting statin therapy to reduce her LDL further.

## 2021-06-10 ENCOUNTER — Other Ambulatory Visit: Payer: Self-pay

## 2021-06-10 ENCOUNTER — Ambulatory Visit (HOSPITAL_COMMUNITY)
Admission: RE | Admit: 2021-06-10 | Discharge: 2021-06-10 | Disposition: A | Discharge status: Home / Self Care | Payer: Medicaid Other | Source: Ambulatory Visit | Attending: Cardiology | Admitting: Cardiology

## 2021-06-10 DIAGNOSIS — Z8249 Family history of ischemic heart disease and other diseases of the circulatory system: Secondary | ICD-10-CM | POA: Insufficient documentation

## 2021-06-10 DIAGNOSIS — R0602 Shortness of breath: Secondary | ICD-10-CM | POA: Diagnosis not present

## 2021-06-10 LAB — ECHOCARDIOGRAM COMPLETE
Area-P 1/2: 3.17 cm2
S' Lateral: 2.5 cm

## 2021-06-10 NOTE — Progress Notes (Signed)
*  PRELIMINARY RESULTS* Echocardiogram 2D Echocardiogram has been performed.  Stacey Drain 06/10/2021, 2:44 PM

## 2021-06-10 NOTE — Telephone Encounter (Signed)
Patient informed. Copy sent to PCP °

## 2021-06-10 NOTE — Telephone Encounter (Signed)
-----   Message from Jonelle Sidle, MD sent at 06/10/2021  4:39 PM EDT ----- Results reviewed.  Echocardiogram shows normal LVEF at 60 to 65%, normal diastolic function, and no significant valvular abnormalities.  Plan as per discussion and Myoview report.  Would keep follow-up with PCP for now.

## 2021-06-13 ENCOUNTER — Other Ambulatory Visit: Payer: Self-pay

## 2021-06-13 ENCOUNTER — Ambulatory Visit (INDEPENDENT_AMBULATORY_CARE_PROVIDER_SITE_OTHER): Payer: Medicaid Other | Admitting: Family Medicine

## 2021-06-13 ENCOUNTER — Encounter: Payer: Self-pay | Admitting: Family Medicine

## 2021-06-13 VITALS — BP 138/83 | HR 100 | Temp 97.8°F | Ht 63.0 in | Wt 218.2 lb

## 2021-06-13 DIAGNOSIS — Z23 Encounter for immunization: Secondary | ICD-10-CM

## 2021-06-13 DIAGNOSIS — H60501 Unspecified acute noninfective otitis externa, right ear: Secondary | ICD-10-CM

## 2021-06-13 DIAGNOSIS — H9191 Unspecified hearing loss, right ear: Secondary | ICD-10-CM | POA: Diagnosis not present

## 2021-06-13 MED ORDER — CIPROFLOXACIN-DEXAMETHASONE 0.3-0.1 % OT SUSP: 4.0000 [drp] | Freq: Two times a day (BID) | OTIC | 0 refills | Status: AC

## 2021-06-13 NOTE — Progress Notes (Signed)
Acute Office Visit  Subjective:    Patient ID: Alice Santiago, female    DOB: Oct 12, 1980, 40 y.o.   MRN: [id number]  Chief Complaint  Patient presents with  . Ear Pain    HPI Patient is in today for right ear pain x 6 days. It feels like a discomfort. She also reports muffled hearing in this year. She did have a tooth pulled on they upper right side last week. She had to go back to the dentist 3 days ago to clean out an infection where they pulled the tooth. She was started of Amoxicillin for this. She reports no improvement in her ear symptoms since starting on amoxicillin. She denies fever or drainage.   Past Medical History:  Diagnosis Date  . Anxiety   . Asthma   . Back problem   . Breast tenderness in female 04/23/2015  . Bronchitis   . BV (bacterial vaginosis) 03/14/2015  . Depression   . History of chlamydia 04/23/2015  . Mixed hyperlipidemia 04/03/2021  . Vaginal Pap smear, abnormal     Past Surgical History:  Procedure Laterality Date  . ANTERIOR CERVICAL DECOMP/DISCECTOMY FUSION  10/08/2017   Procedure: Anterior Cervical Decompression/Discectomy Fusion - Cervical five-Cervical six;  Surgeon: Eustace Moore, MD;  Location: [place];  Service: Neurosurgery;;  . CESAREAN SECTION     times 2  . LUMBAR LAMINECTOMY/DECOMPRESSION MICRODISCECTOMY Left 11/10/2014   Procedure: LUMBAR LAMINECTOMY/DECOMPRESSION MICRODISCECTOMY LEFT LUMBAR FIVE-SACRAL ONE;  Surgeon: Eustace Moore, MD;  Location: [facility];  Service: Neurosurgery;  Laterality: Left;  left  . TONSILLECTOMY    . TOTAL ABDOMINAL HYSTERECTOMY    . TUBAL LIGATION      Family History  Problem Relation Age of Onset  . Depression Mother   . Anxiety disorder Mother   . Heart murmur Father   . Other Son        problems with bowels  . Asthma Son   . ADD / ADHD Son   . ODD Son   . Asthma Son   . Cancer Neg Hx   . Breast cancer Neg Hx     Social History   Socioeconomic History  . Marital status: Single     Spouse name: Not on file  . Number of children: 2  . Years of education: Not on file  . Highest education level: Not on file  Occupational History  . Not on file  Tobacco Use  . Smoking status: Every Day    Packs/day: 1.00    Years: 21.00    Pack years: 21.00    Types: Cigarettes  . Smokeless tobacco: Never  Vaping Use  . Vaping Use: Former  Substance and Sexual Activity  . Alcohol use: Yes    Comment: occ  . Drug use: Yes    Types: Marijuana  . Sexual activity: Not Currently    Birth control/protection: Surgical    Comment: hyst  Other Topics Concern  . Not on file  Social History Narrative  . Not on file   Social Determinants of Health   Financial Resource Strain: Not on file  Food Insecurity: Not on file  Transportation Needs: Not on file  Physical Activity: Not on file  Stress: Not on file  Social Connections: Not on file  Intimate Partner Violence: Not on file    Outpatient Medications Prior to Visit  Medication Sig Dispense Refill  . albuterol (PROAIR HFA) 108 (90 Base) MCG/ACT inhaler Inhale 2 puffs into the  lungs every 6 (six) hours as needed for wheezing or shortness of breath. 18 g 2  . albuterol (PROVENTIL) (2.5 MG/3ML) 0.083% nebulizer solution Take 3 mLs (2.5 mg total) by nebulization every 4 (four) hours as needed for wheezing or shortness of breath. 100 mL 1  . amoxicillin (AMOXIL) 500 MG capsule Take 500 mg by mouth 3 (three) times daily.    . ARIPiprazole (ABILIFY) 5 MG tablet Take 1 tablet (5 mg total) by mouth at bedtime. 90 tablet 1  . budesonide-formoterol (SYMBICORT) 160-4.5 MCG/ACT inhaler Inhale 2 puffs into the lungs 2 (two) times daily. 1 each 5  . DULoxetine (CYMBALTA) 30 MG capsule Take 30 mg (1 capsule) by mouth daily x1 week, then increase to 60 mg (2 capsules) daily. 60 capsule 2  . ibuprofen (ADVIL) 600 MG tablet Take 600 mg by mouth every 6 (six) hours as needed.     No facility-administered medications prior to visit.    No Known  Allergies  Review of Systems As per HPI.     Objective:    Physical Exam Vitals and nursing note reviewed.  Constitutional:      General: She is not in acute distress.    Appearance: She is not ill-appearing, toxic-appearing or diaphoretic.  HENT:     Head:     Comments: Swelling to right lower side of face.     Right Ear: Tympanic membrane and external ear normal. Drainage (yellow) and tenderness (canal) present. No swelling. There is no impacted cerumen. No foreign body. No mastoid tenderness.     Left Ear: Tympanic membrane, ear canal and external ear normal.     Mouth/Throat:     Dentition: Dental tenderness (right upper) present. No dental abscesses.  Pulmonary:     Effort: Pulmonary effort is normal. No respiratory distress.  Skin:    General: Skin is warm and dry.  Neurological:     General: No focal deficit present.     Mental Status: She is alert and oriented to person, place, and time.  Psychiatric:        Mood and Affect: Mood normal.        Behavior: Behavior normal.    BP 138/83   Pulse 100   Temp 97.8 F (36.6 C) (Temporal)   Ht $R'5\' 3"'Bi$  (1.6 m)   Wt 218 lb 4 oz (99 kg)   BMI 38.66 kg/m  Wt Readings from Last 3 Encounters:  06/13/21 218 lb 4 oz (99 kg)  05/17/21 224 lb 9.6 oz (101.9 kg)  05/16/21 222 lb (100.7 kg)    Health Maintenance Due  Topic Date Due  . INFLUENZA VACCINE  04/08/2021    There are no preventive care reminders to display for this patient.   Lab Results  Component Value Date   TSH 1.140 04/02/2021   Lab Results  Component Value Date   WBC 7.6 04/02/2021   HGB 14.7 04/02/2021   HCT 43.6 04/02/2021   MCV 94 04/02/2021   PLT 315 04/02/2021   Lab Results  Component Value Date   NA 141 04/02/2021   K 4.5 04/02/2021   CO2 24 04/02/2021   GLUCOSE 110 (H) 04/02/2021   BUN 8 04/02/2021   CREATININE 0.72 04/02/2021   BILITOT <0.2 04/02/2021   ALKPHOS 102 04/02/2021   AST 18 04/02/2021   ALT 14 04/02/2021   PROT 6.8  04/02/2021   ALBUMIN 4.5 04/02/2021   CALCIUM 9.3 04/02/2021   ANIONGAP 7 01/08/2020   EGFR  108 04/02/2021   Lab Results  Component Value Date   CHOL 206 (H) 04/02/2021   Lab Results  Component Value Date   HDL 38 (L) 04/02/2021   Lab Results  Component Value Date   LDLCALC 123 (H) 04/02/2021   Lab Results  Component Value Date   TRIG 255 (H) 04/02/2021   Lab Results  Component Value Date   CHOLHDL 5.4 (H) 04/02/2021   Lab Results  Component Value Date   HGBA1C 5.9 (H) 07/28/2014       Assessment & Plan:   Alice was seen today for ear pain.  Diagnoses and all orders for this visit:  Acute otitis externa of right ear, unspecified type Ciprodex as below.  -     ciprofloxacin-dexamethasone (CIPRODEX) OTIC suspension; Place 4 drops into the right ear 2 (two) times daily for 7 days.  Decreased hearing of right ear Discussed should improve with improvement of swelling from right upper tooth infection. Currently on amoxicillin for this. Also discussed zyrtec and/or flonase OTC.   Need for immunization against influenza -     Flu Vaccine QUAD 35moIM (Fluarix, Fluzone & Alfiuria Quad PF)   Return to office for new or worsening symptoms, or if symptoms persist.   The patient indicates understanding of these issues and agrees with the plan.   TGwenlyn Perking FNP

## 2021-06-13 NOTE — Patient Instructions (Signed)
Earache, Adult An earache, or ear pain, can be caused by many things, including: An infection. Ear wax buildup. Ear pressure. Something in the ear that should not be there (foreign body). A sore throat. Tooth problems. Jaw problems. Treatment of the earache will depend on the cause. If the cause is not clear or cannot be determined, you may need to watch your symptoms until your earache goes away or until a cause is found. Follow these instructions at home: Medicines Take or apply over-the-counter and prescription medicines only as told by your health care provider. If you were prescribed an antibiotic medicine, use it as told by your health care provider. Do not stop using the antibiotic even if you start to feel better. Do not put anything in your ear other than medicine that is prescribed by your health care provider. Managing pain If directed, apply heat to the affected area as often as told by your health care provider. Use the heat source that your health care provider recommends, such as a moist heat pack or a heating pad. Place a towel between your skin and the heat source. Leave the heat on for 20-30 minutes. Remove the heat if your skin turns bright red. This is especially important if you are unable to feel pain, heat, or cold. You may have a greater risk of getting burned. If directed, put ice on the affected area as often as told by your health care provider. To do this:   Put ice in a plastic bag. Place a towel between your skin and the bag. Leave the ice on for 20 minutes, 2-3 times a day. General instructions Pay attention to any changes in your symptoms. Try resting in an upright position instead of lying down. This may help to reduce pressure in your ear and relieve pain. Chew gum if it helps to relieve your ear pain. Treat any allergies as told by your health care provider. Drink enough fluid to keep your urine pale yellow. It is up to you to get the results of any  tests that were done. Ask your health care provider, or the department that is doing the tests, when your results will be ready. Keep all follow-up visits as told by your health care provider. This is important. Contact a health care provider if: Your pain does not improve within 2 days. Your earache gets worse. You have new symptoms. You have a fever. Get help right away if you: Have a severe headache. Have a stiff neck. Have trouble swallowing. Have redness or swelling behind your ear. Have fluid or blood coming from your ear. Have hearing loss. Feel dizzy. Summary An earache, or ear pain, can be caused by many things. Treatment of the earache will depend on the cause. Follow recommendations from your health care provider to treat your ear pain. If the cause is not clear or cannot be determined, you may need to watch your symptoms until your earache goes away or until a cause is found. Keep all follow-up visits as told by your health care provider. This is important. This information is not intended to replace advice given to you by your health care provider. Make sure you discuss any questions you have with your health care provider. Document Revised: 04/02/2019 Document Reviewed: 04/02/2019 Elsevier Patient Education  2022 Elsevier Inc.  

## 2021-06-17 ENCOUNTER — Telehealth: Payer: Self-pay | Admitting: Family Medicine

## 2021-06-17 DIAGNOSIS — H60501 Unspecified acute noninfective otitis externa, right ear: Secondary | ICD-10-CM

## 2021-06-17 MED ORDER — PREDNISONE 20 MG PO TABS: 40.0000 mg | ORAL_TABLET | Freq: Every day | ORAL | 0 refills | Status: AC

## 2021-06-17 MED ORDER — AMOXICILLIN-POT CLAVULANATE 875-125 MG PO TABS: 1.0000 | ORAL_TABLET | Freq: Two times a day (BID) | ORAL | 0 refills | Status: AC

## 2021-06-17 NOTE — Telephone Encounter (Signed)
I have sent in augmentin and a prednisone burst. Stop taking amoxicillin and take agmentin instead. If this does not improve symptoms, she may need a referral.

## 2021-06-17 NOTE — Telephone Encounter (Signed)
That is fine. Can do depo-medrol 80 mg x 1

## 2021-06-17 NOTE — Telephone Encounter (Signed)
Patient aware and verbalized understanding. She does not want the prednisone due to how it makes her feel she wants to come in office for a shot. Please advise

## 2021-06-17 NOTE — Telephone Encounter (Signed)
Spoke with patient, she cannot come in this afternoon for Depo Medrol injection.  Appointment scheduled in the morning at 11 am.

## 2021-06-18 ENCOUNTER — Other Ambulatory Visit: Payer: Self-pay

## 2021-06-18 ENCOUNTER — Telehealth: Payer: Self-pay

## 2021-06-18 ENCOUNTER — Ambulatory Visit (INDEPENDENT_AMBULATORY_CARE_PROVIDER_SITE_OTHER): Payer: Medicaid Other

## 2021-06-18 DIAGNOSIS — H60501 Unspecified acute noninfective otitis externa, right ear: Secondary | ICD-10-CM | POA: Diagnosis not present

## 2021-06-18 MED ORDER — FLUCONAZOLE 150 MG PO TABS: 150.0000 mg | ORAL_TABLET | Freq: Once | ORAL | 0 refills | Status: AC

## 2021-06-18 MED ORDER — METHYLPREDNISOLONE ACETATE 80 MG/ML IJ SUSP
80.0000 mg | Freq: Once | INTRAMUSCULAR | Status: AC
  Administered 2021-06-18: 80 mg via INTRAMUSCULAR

## 2021-06-18 NOTE — Telephone Encounter (Signed)
Diflucan sent

## 2021-06-18 NOTE — Telephone Encounter (Signed)
Patient was prescribed antibiotic yesterday by Alice Santiago for an ear infection.  She has a tendency to develop yeast infections with antibiotic use and would like to know if you will send in Diflucan to [facility].  Please advise.

## 2021-06-18 NOTE — Progress Notes (Signed)
Depo Medrol 80 mg given to right upper arm per patient request.  Patient tolerated well.

## 2021-06-20 ENCOUNTER — Telehealth: Payer: Self-pay | Admitting: Family Medicine

## 2021-06-20 DIAGNOSIS — H9191 Unspecified hearing loss, right ear: Secondary | ICD-10-CM

## 2021-06-20 DIAGNOSIS — H9201 Otalgia, right ear: Secondary | ICD-10-CM

## 2021-06-20 NOTE — Telephone Encounter (Signed)
Patient aware and verbalizes understanding. 

## 2021-06-20 NOTE — Telephone Encounter (Signed)
Referral placed.

## 2021-07-08 ENCOUNTER — Telehealth: Payer: Self-pay | Admitting: Pulmonary Disease

## 2021-07-09 NOTE — Telephone Encounter (Signed)
Made pt aware it will be a few more weeks to be scheduled.  Pt verbalized understanding.  Nothing further needed at this time.

## 2021-07-29 ENCOUNTER — Ambulatory Visit: Payer: Medicaid Other

## 2021-07-29 ENCOUNTER — Other Ambulatory Visit: Payer: Self-pay

## 2021-07-29 DIAGNOSIS — G4733 Obstructive sleep apnea (adult) (pediatric): Secondary | ICD-10-CM | POA: Diagnosis not present

## 2021-08-12 ENCOUNTER — Encounter: Payer: Self-pay | Admitting: Family Medicine

## 2021-08-12 ENCOUNTER — Telehealth: Payer: Self-pay | Admitting: Pulmonary Disease

## 2021-08-12 ENCOUNTER — Ambulatory Visit (INDEPENDENT_AMBULATORY_CARE_PROVIDER_SITE_OTHER): Payer: Medicaid Other | Admitting: Family Medicine

## 2021-08-12 DIAGNOSIS — J069 Acute upper respiratory infection, unspecified: Secondary | ICD-10-CM

## 2021-08-12 DIAGNOSIS — K529 Noninfective gastroenteritis and colitis, unspecified: Secondary | ICD-10-CM

## 2021-08-12 LAB — VERITOR FLU A/B WAIVED
Influenza A: NEGATIVE
Influenza B: NEGATIVE

## 2021-08-12 MED ORDER — CIPROFLOXACIN HCL 500 MG PO TABS: 500.0000 mg | ORAL_TABLET | Freq: Two times a day (BID) | ORAL | 0 refills | Status: DC

## 2021-08-12 MED ORDER — FLUCONAZOLE 150 MG PO TABS: 150.0000 mg | ORAL_TABLET | Freq: Once | ORAL | 0 refills | Status: AC

## 2021-08-12 MED ORDER — METRONIDAZOLE 500 MG PO TABS: 500.0000 mg | ORAL_TABLET | Freq: Two times a day (BID) | ORAL | 0 refills | Status: DC

## 2021-08-12 NOTE — Progress Notes (Signed)
Subjective:    Patient ID: Alice Santiago, female    DOB: Jun 26, 1981, 40 y.o.   MRN: [id number]   HPI: Alice Santiago is a 40 y.o. female Nausea, vomiting and loose BMS 4-5 a day. Ongoing for 2 weeks. Appetite is decreased.   Depression screen [facility] 2/9 04/02/2021 08/13/2020 08/23/2019 11/12/2018 05/20/2018  Decreased Interest 1 3 2 2 2   Down, Depressed, Hopeless 1 1 2 3 3   PHQ - 2 Score 2 4 4 5 5   Altered sleeping 3 3 1 3 2   Tired, decreased energy 1 1 1 3 3   Change in appetite 1 1 1 3 3   Feeling bad or failure about yourself  1 1 2 3 3   Trouble concentrating 1 1 3 3 3   Moving slowly or fidgety/restless 0 0 1 0 3  Suicidal thoughts 0 0 0 0 1  PHQ-9 Score 9 11 13 20 23   Difficult doing work/chores Not difficult at all - Somewhat difficult - -     Relevant past medical, surgical, family and social history reviewed and updated as indicated.  Interim medical history since our last visit reviewed. Allergies and medications reviewed and updated.  ROS:  Review of Systems  Constitutional: Negative.   HENT: Negative.    Eyes:  Negative for visual disturbance.  Respiratory:  Positive for cough (a little). Negative for shortness of breath.   Cardiovascular:  Negative for chest pain.  Gastrointestinal:  Negative for abdominal pain.  Musculoskeletal:  Negative for arthralgias.    Social History   Tobacco Use  Smoking Status Every Day   Packs/day: 1.00   Years: 21.00   Pack years: 21.00   Types: Cigarettes  Smokeless Tobacco Never       Objective:     Wt Readings from Last 3 Encounters:  06/13/21 218 lb 4 oz (99 kg)  05/17/21 224 lb 9.6 oz (101.9 kg)  05/16/21 222 lb (100.7 kg)     Exam deferred. Pt. Harboring due to COVID 19. Phone visit performed.   Assessment & Plan:   1. Viral URI   2. Colitis     Meds ordered this encounter  Medications   metroNIDAZOLE (FLAGYL) 500 MG tablet    Sig: Take 1 tablet (500 mg total) by mouth 2 (two) times daily.    Dispense:  14  tablet    Refill:  0   ciprofloxacin (CIPRO) 500 MG tablet    Sig: Take 1 tablet (500 mg total) by mouth 2 (two) times daily.    Dispense:  14 tablet    Refill:  0   fluconazole (DIFLUCAN) 150 MG tablet    Sig: Take 1 tablet (150 mg total) by mouth once for 1 dose. At onset of symptoms. Repeat at end of treatment    Dispense:  2 tablet    Refill:  0    Orders Placed This Encounter  Procedures   Veritor Flu A/B Waived    Order Specific Question:   Source    Answer:   nasal      Diagnoses and all orders for this visit:  Viral URI -     Cancel: COVID-19, Flu A+B and RSV -     Cancel: Influenza A/B -     Veritor Flu A/B Waived  Colitis  Other orders -     metroNIDAZOLE (FLAGYL) 500 MG tablet; Take 1 tablet (500 mg total) by mouth 2 (two) times daily. -     ciprofloxacin (CIPRO)  500 MG tablet; Take 1 tablet (500 mg total) by mouth 2 (two) times daily. -     fluconazole (DIFLUCAN) 150 MG tablet; Take 1 tablet (150 mg total) by mouth once for 1 dose. At onset of symptoms. Repeat at end of treatment   Virtual Visit via telephone Note  I discussed the limitations, risks, security and privacy concerns of performing an evaluation and management service by telephone and the availability of in person appointments. The patient was identified with two identifiers. Pt.expressed understanding and agreed to proceed. Pt. Is at home. Dr. Livia Snellen is in his office.  Follow Up Instructions:   I discussed the assessment and treatment plan with the patient. The patient was provided an opportunity to ask questions and all were answered. The patient agreed with the plan and demonstrated an understanding of the instructions.   The patient was advised to call back or seek an in-person evaluation if the symptoms worsen or if the condition fails to improve as anticipated.   Total minutes including chart review and phone contact time: 14   Follow up plan: Return if symptoms worsen or fail to  improve.  Claretta Fraise, MD [place]

## 2021-08-12 NOTE — Telephone Encounter (Signed)
AO please advise on results of HST.  thanks

## 2021-08-15 ENCOUNTER — Telehealth: Payer: Self-pay | Admitting: Family Medicine

## 2021-08-15 NOTE — Telephone Encounter (Signed)
Let her know that colitis is a broad tem for many illnesses. Hers is a mild infection that will NOT become cancer. She will be ffine.

## 2021-08-15 NOTE — Telephone Encounter (Signed)
Lmtcb.

## 2021-08-15 NOTE — Telephone Encounter (Signed)
Returned patients call- patient was very upset on the phone due to her new dx on 12/5 of colitis.  She states that the provider did not explain it to her and she googled it and it said it could turn into colon caner.  She wants to know if she has to have a colonoscopy, will she have this the rest of her life, and what are the next steps? Patient is very concerned - please advise

## 2021-08-15 NOTE — Telephone Encounter (Signed)
Patient aware.

## 2021-08-15 NOTE — Telephone Encounter (Signed)
She will be fine. The medicine will clear the problem. It is not the type of colitis that is dangerous.

## 2021-08-15 NOTE — Telephone Encounter (Signed)
Patient aware and verbalizes understanding. Would like Dr. Darlyn Read to call her as she is still very concerned and has a lot of questions.

## 2021-08-16 ENCOUNTER — Telehealth: Payer: Self-pay | Admitting: Family Medicine

## 2021-08-16 DIAGNOSIS — J111 Influenza due to unidentified influenza virus with other respiratory manifestations: Secondary | ICD-10-CM | POA: Diagnosis not present

## 2021-08-16 DIAGNOSIS — J209 Acute bronchitis, unspecified: Secondary | ICD-10-CM | POA: Diagnosis not present

## 2021-08-16 DIAGNOSIS — K529 Noninfective gastroenteritis and colitis, unspecified: Secondary | ICD-10-CM | POA: Diagnosis not present

## 2021-08-16 NOTE — Telephone Encounter (Signed)
Patient aware.

## 2021-08-16 NOTE — Telephone Encounter (Signed)
Viral illnesses typically last anywhere from 7 to 10 days.  Symptoms get worse until around day 4 at which time she should start feeling better.  Flu is viral.  Tamiflu can only be given within the first 48 hours of symptoms.  Being tested for flu would not change anything for her as it is symptom management.  However if she still wishes to be tested and she feels like she is getting worse, she needs an appointment.

## 2021-08-17 ENCOUNTER — Telehealth: Payer: Self-pay | Admitting: Pulmonary Disease

## 2021-08-17 DIAGNOSIS — G4733 Obstructive sleep apnea (adult) (pediatric): Secondary | ICD-10-CM | POA: Diagnosis not present

## 2021-08-17 NOTE — Telephone Encounter (Signed)
Call patient  Sleep study result  Date of study: 07/29/2021  Impression: Severe obstructive sleep apnea Moderate oxygen desaturations  Recommendation: DME referral  Recommend CPAP therapy for severe obstructive sleep apnea  Auto titrating CPAP with pressure settings of 5-20 will be appropriate  Encourage weight loss measures  Follow-up in the office 4 to 6 weeks following initiation of treatment

## 2021-08-19 ENCOUNTER — Telehealth: Payer: Self-pay | Admitting: Pulmonary Disease

## 2021-08-19 DIAGNOSIS — G4733 Obstructive sleep apnea (adult) (pediatric): Secondary | ICD-10-CM

## 2021-08-19 NOTE — Telephone Encounter (Signed)
I called the patient and gave her the results and she is agreeable to the CPAP. Order has been placed. Patient aware. Nothing further needed.

## 2021-08-20 ENCOUNTER — Telehealth: Payer: Self-pay | Admitting: Pulmonary Disease

## 2021-08-20 NOTE — Telephone Encounter (Signed)
The patient insurance will not pay for supplies unless she has an in lab study. Please advise.

## 2021-08-20 NOTE — Telephone Encounter (Signed)
She needs a titration study performed  Home sleep study did reveal severe obstructive sleep apnea   Go ahead and schedule her for an in in lab titration study  We may need to ask one of the PCC's whether a split study is needed for it to be paid for , even though the home sleep study already gave [place] a diagnosis

## 2021-08-21 NOTE — Telephone Encounter (Signed)
I called Alice Santiago to verify what else is needed to get her CPAP supplies. I have placed the order for a split night study.   I have let the patient know that we may need an in lab titration study. She reports that if everything is not covered then she will not be able to afford the machine out of pocket.   Waiting on a call back.

## 2021-08-21 NOTE — Telephone Encounter (Signed)
error 

## 2021-08-27 NOTE — Telephone Encounter (Signed)
Separate encounter on 12/10 about pt's HST results. Closing this encounter.

## 2021-09-27 ENCOUNTER — Other Ambulatory Visit: Payer: Self-pay

## 2021-09-27 ENCOUNTER — Ambulatory Visit: Payer: Medicaid Other | Attending: Pulmonary Disease | Admitting: Pulmonary Disease

## 2021-09-27 DIAGNOSIS — G4736 Sleep related hypoventilation in conditions classified elsewhere: Secondary | ICD-10-CM | POA: Diagnosis not present

## 2021-09-27 DIAGNOSIS — G4733 Obstructive sleep apnea (adult) (pediatric): Secondary | ICD-10-CM | POA: Insufficient documentation

## 2021-09-29 ENCOUNTER — Telehealth: Payer: Self-pay | Admitting: Pulmonary Disease

## 2021-09-29 DIAGNOSIS — R0902 Hypoxemia: Secondary | ICD-10-CM

## 2021-09-29 DIAGNOSIS — G4733 Obstructive sleep apnea (adult) (pediatric): Secondary | ICD-10-CM

## 2021-09-29 NOTE — Procedures (Signed)
POLYSOMNOGRAPHY  Last, First: Alice Santiago, Alice MRN: [id number] Gender: Female Age (years): 40 Weight (lbs): 218 DOB: 1980-11-28 BMI: 39 Primary Care: No PCP Epworth Score: N/A Referring: Tomma Lightning MD Technician: Peak, Robert InterpretingTomma Lightning MD Study Type: NPSG Ordered Study Type: Split Night CPAP Study date: 09/27/2021 Location: [place] CLINICAL INFORMATION Alice Santiago is a 41 year old Female and was referred to the sleep center for evaluation of N/A. Indications include N/A.  MEDICATIONS Patient self administered medications include: N/A. Medications administered during study include No sleep medicine administered.  SLEEP STUDY TECHNIQUE A multi-channel overnight Polysomnography study was performed. The channels recorded and monitored were central and occipital EEG, electrooculogram (EOG), submentalis EMG (chin), nasal and oral airflow, thoracic and abdominal wall motion, anterior tibialis EMG, snore microphone, electrocardiogram, and a pulse oximetry. TECHNICIAN COMMENTS Comments added by Technician: Patient could not tolerate CPAP. Patient had more than two awakenings to use the bathroom Comments added by Scorer: N/A SLEEP ARCHITECTURE The study was initiated at 9:17:50 PM and terminated at 5:03:36 AM. The total recorded time was 465.8 minutes. EEG confirmed total sleep time was 250 minutes yielding a sleep efficiency of 53.7%. Sleep onset after lights out was 37.3 minutes with a REM latency of N/A minutes. The patient spent 19.60% of the night in stage N1 sleep, 80.40% in stage N2 sleep, 0.00% in stage N3 and 0% in REM. Wake after sleep onset (WASO) was 178.5 minutes. The Arousal Index was 14.9/hour. RESPIRATORY PARAMETERS There were a total of 177 respiratory disturbances out of which 72 were apneas ( 71 obstructive, 1 mixed, 0 central) and 105 hypopneas. The apnea/hypopnea index (AHI) was 42.5 events/hour. The central sleep apnea index was 0 events/hour.  The REM AHI was N/A events/hour and NREM AHI was 42.5 events/hour. The supine AHI was N/A events/hour and the non supine AHI was 42.5 supine during 0.00% of sleep. Respiratory disturbances were associated with oxygen desaturation down to a nadir of 85.00% during sleep. The mean oxygen saturation during the study was 93.20%. The cumulative time under 88% oxygen saturation was 4.9 minutes.  LEG MOVEMENT DATA The total leg movements were 0 with a resulting leg movement index of 0.00/hr .Associated arousal with leg movement index was 0.0/hr.  CARDIAC DATA The underlying cardiac rhythm was most consistent with sinus rhythm. Mean heart rate during sleep was 72.24 bpm. Additional rhythm abnormalities include None.  IMPRESSIONS - Severe Obstructive Sleep apnea(OSA) - EKG showed no cardiac abnormalities. - Severe Oxygen Desaturation - The patient snored with loud snoring volume. - No significant periodic leg movements(PLMs) during sleep. However, no significant associated arousals.  DIAGNOSIS - Obstructive Sleep Apnea (G47.33) - Nocturnal Hypoxemia (G47.36)  RECOMMENDATIONS - CPAP titration not well-tolerated- did not tolerate CPAP nor BiPAP at even low pressures - Follow-up to discuss and determine other options of treatment - Avoid alcohol, sedatives and other CNS depressants that may worsen sleep apnea and disrupt normal sleep architecture. - Sleep hygiene should be reviewed to assess factors that may improve sleep quality. - Weight management and regular exercise should be initiated or continued.  [Electronically signed] 09/29/2021 01:27 PM  Virl Diamond MD NPI: [id number]

## 2021-09-29 NOTE — Telephone Encounter (Signed)
Call patient  Sleep study result  Date of study: 09/27/2021  Impression: Severe obstructive sleep apnea Moderate oxygen desaturations Intolerant of CPAP  Recommendation: Schedule follow-up appointment to further discuss options of treatment  CPAP/BiPAP was not tolerated during titration  Encourage continue weight loss efforts  Encourage smoking cessation  Elevate head of bed, avoid supine sleep as tolerated  Follow-up for further discussions regarding options of treatment

## 2021-10-01 ENCOUNTER — Encounter: Payer: Self-pay | Admitting: Family Medicine

## 2021-10-01 ENCOUNTER — Ambulatory Visit (INDEPENDENT_AMBULATORY_CARE_PROVIDER_SITE_OTHER): Payer: Medicaid Other | Admitting: Family Medicine

## 2021-10-01 ENCOUNTER — Ambulatory Visit (INDEPENDENT_AMBULATORY_CARE_PROVIDER_SITE_OTHER): Payer: Medicaid Other

## 2021-10-01 VITALS — BP 141/84 | HR 97 | Temp 98.2°F | Ht 63.0 in | Wt 216.6 lb

## 2021-10-01 DIAGNOSIS — R112 Nausea with vomiting, unspecified: Secondary | ICD-10-CM | POA: Diagnosis not present

## 2021-10-01 DIAGNOSIS — K529 Noninfective gastroenteritis and colitis, unspecified: Secondary | ICD-10-CM | POA: Diagnosis not present

## 2021-10-01 DIAGNOSIS — R109 Unspecified abdominal pain: Secondary | ICD-10-CM | POA: Diagnosis not present

## 2021-10-01 LAB — URINALYSIS
Bilirubin, UA: NEGATIVE
Glucose, UA: NEGATIVE
Leukocytes,UA: NEGATIVE
Nitrite, UA: NEGATIVE
Protein,UA: NEGATIVE
RBC, UA: NEGATIVE
Specific Gravity, UA: 1.02 (ref 1.005–1.030)
Urobilinogen, Ur: 0.2 mg/dL (ref 0.2–1.0)
pH, UA: 6.5 (ref 5.0–7.5)

## 2021-10-01 MED ORDER — ONDANSETRON 8 MG PO TBDP: 8.0000 mg | ORAL_TABLET | Freq: Four times a day (QID) | ORAL | 1 refills | Status: DC | PRN

## 2021-10-01 MED ORDER — DIPHENOXYLATE-ATROPINE 2.5-0.025 MG PO TABS: 2.0000 | ORAL_TABLET | Freq: Four times a day (QID) | ORAL | 0 refills | Status: DC | PRN

## 2021-10-01 NOTE — Patient Instructions (Signed)
Bland Diet A bland diet consists of foods that are often soft and do not have a lot of fat, fiber, or extra seasonings. Foods without fat, fiber, or seasoning are easier for the body to digest. They are also less likely to irritate your mouth, throat, stomach, and other parts of your digestive system. A bland dietis sometimes called a BRAT diet. What is my plan? Your health care provider or food and nutrition specialist (dietitian) may recommend specific changes to your diet to prevent symptoms or to treat your symptoms. These changes may include: Eating small meals often. Cooking food until it is soft enough to chew easily. Chewing your food well. Drinking fluids slowly. Not eating foods that are very spicy, sour, or fatty. Not eating citrus fruits, such as oranges and grapefruit. What do I need to know about this diet? Eat a variety of foods from the bland diet food list. Do not follow a bland diet longer than needed. Ask your health care provider whether you should take vitamins or supplements. What foods can I eat? Grains  Hot cereals, such as cream of wheat. Rice. Bread, crackers, or tortillas madefrom refined white flour. Vegetables Canned or cooked vegetables. Mashed or boiled potatoes. Fruits  Bananas. Applesauce. Other types of cooked or canned fruit with the skin andseeds removed, such as canned peaches or pears. Meats and other proteins  Scrambled eggs. Creamy peanut butter or other nut butters. Lean, well-cookedmeats, such as chicken or fish. Tofu. Soups or broths. Dairy Low-fat dairy products, such as milk, cottage cheese, or yogurt. Beverages  Water. Herbal tea. Apple juice. Fats and oils Mild salad dressings. Canola or olive oil. Sweets and desserts Pudding. Custard. Fruit gelatin. Ice cream. The items listed above may not be a complete list of recommended foods and beverages. Contact a dietitian for more options. What foods are not recommended? Grains Whole grain  breads and cereals. Vegetables Raw vegetables. Fruits Raw fruits, especially citrus, berries, or dried fruits. Dairy Whole fat dairy foods. Beverages Caffeinated drinks. Alcohol. Seasonings and condiments Strongly flavored seasonings or condiments. Hot sauce. Salsa. Other foods Spicy foods. Fried foods. Sour foods, such as pickled or fermented foods. Foodswith high sugar content. Foods high in fiber. The items listed above may not be a complete list of foods and beverages to avoid. Contact a dietitian for more information. Summary A bland diet consists of foods that are often soft and do not have a lot of fat, fiber, or extra seasonings. Foods without fat, fiber, or seasoning are easier for the body to digest. Check with your health care provider to see how long you should follow this diet plan. It is not meant to be followed for long periods. This information is not intended to replace advice given to you by your health care provider. Make sure you discuss any questions you have with your healthcare provider. Document Revised: 09/23/2017 Document Reviewed: 09/23/2017 Elsevier Patient Education  2022 Elsevier Inc.  

## 2021-10-01 NOTE — Progress Notes (Signed)
Subjective:  Patient ID: Alice Santiago, female    DOB: 1981/09/05  Age: 41 y.o. MRN: [id number]  CC: Nausea, Fatigue, Weakness, and Diarrhea   HPI Alice Santiago presents for Nausea and vomiting onset 4 nights ago. Getting worse.Using phenergan pills without relief. Dry heaves. Diarrhea also occurring several times a day.  Describes pain in abd. As a little discomfort. Not eating much. No appetite.   Depression screen [facility] 2/9 10/01/2021 04/02/2021 08/13/2020  Decreased Interest 0 1 3  Down, Depressed, Hopeless 0 1 1  PHQ - 2 Score 0 2 4  Altered sleeping - 3 3  Tired, decreased energy - 1 1  Change in appetite - 1 1  Feeling bad or failure about yourself  - 1 1  Trouble concentrating - 1 1  Moving slowly or fidgety/restless - 0 0  Suicidal thoughts - 0 0  PHQ-9 Score - 9 11  Difficult doing work/chores - Not difficult at all -    History Alice has a past medical history of Anxiety, Asthma, Back problem, Breast tenderness in female (04/23/2015), Bronchitis, BV (bacterial vaginosis) (03/14/2015), Depression, History of chlamydia (04/23/2015), Mixed hyperlipidemia (04/03/2021), and Vaginal Pap smear, abnormal.   She has a past surgical history that includes Total abdominal hysterectomy; Tubal ligation; Cesarean section; Lumbar laminectomy/decompression microdiscectomy (Left, 11/10/2014); Tonsillectomy; and Anterior cervical decomp/discectomy fusion (10/08/2017).   Her family history includes ADD / ADHD in her son; Anxiety disorder in her mother; Asthma in her son and son; Depression in her mother; Heart murmur in her father; ODD in her son; Other in her son.She reports that she has been smoking cigarettes. She has a 21.00 pack-year smoking history. She has never used smokeless tobacco. She reports current alcohol use. She reports current drug use. Drug: Marijuana.    ROS Review of Systems  Objective:  BP (!) 141/84    Pulse 97    Temp 98.2 F (36.8 C)    Ht 5' 3"  (1.6 m)    Wt 216  lb 9.6 oz (98.2 kg)    SpO2 96%    BMI 38.37 kg/m   BP Readings from Last 3 Encounters:  10/01/21 (!) 141/84  06/13/21 138/83  05/17/21 134/80    Wt Readings from Last 3 Encounters:  10/01/21 216 lb 9.6 oz (98.2 kg)  06/13/21 218 lb 4 oz (99 kg)  05/17/21 224 lb 9.6 oz (101.9 kg)     Physical Exam Constitutional:      General: She is in acute distress.     Appearance: She is well-developed. She is obese. She is ill-appearing.  HENT:     Head: Normocephalic and atraumatic.  Cardiovascular:     Rate and Rhythm: Normal rate and regular rhythm.     Heart sounds: No murmur heard. Pulmonary:     Effort: Pulmonary effort is normal.     Breath sounds: Normal breath sounds.  Abdominal:     General: Bowel sounds are normal.     Palpations: Abdomen is soft. There is no mass.     Tenderness: There is no abdominal tenderness. There is no guarding or rebound.  Skin:    General: Skin is warm and dry.  Neurological:     Mental Status: She is alert and oriented to person, place, and time.  Psychiatric:        Behavior: Behavior normal.      Assessment & Plan:   Alice was seen today for nausea, fatigue, weakness and diarrhea.  Diagnoses  and all orders for this visit:  Nausea and vomiting, unspecified vomiting type -     Urinalysis -     Urine Culture -     Lipase -     CBC with Differential/Platelet -     CMP14+EGFR -     DG Abd 2 Views; Future  Gastroenteritis, acute  Other orders -     diphenoxylate-atropine (LOMOTIL) 2.5-0.025 MG tablet; Take 2 tablets by mouth 4 (four) times daily as needed for diarrhea or loose stools. -     ondansetron (ZOFRAN-ODT) 8 MG disintegrating tablet; Take 1 tablet (8 mg total) by mouth every 6 (six) hours as needed for nausea or vomiting.       I have discontinued Alice M. Scalese's metroNIDAZOLE and ciprofloxacin. I am also having her start on diphenoxylate-atropine and ondansetron. Additionally, I am having her maintain her albuterol,  budesonide-formoterol, albuterol, ARIPiprazole, DULoxetine, and ibuprofen.  Allergies as of 10/01/2021   No Known Allergies      Medication List        Accurate as of October 01, 2021  4:21 PM. If you have any questions, ask your nurse or doctor.          STOP taking these medications    ciprofloxacin 500 MG tablet Commonly known as: Cipro Stopped by: Claretta Fraise, MD   metroNIDAZOLE 500 MG tablet Commonly known as: FLAGYL Stopped by: Claretta Fraise, MD       TAKE these medications    albuterol 108 (90 Base) MCG/ACT inhaler Commonly known as: ProAir HFA Inhale 2 puffs into the lungs every 6 (six) hours as needed for wheezing or shortness of breath.   albuterol (2.5 MG/3ML) 0.083% nebulizer solution Commonly known as: PROVENTIL Take 3 mLs (2.5 mg total) by nebulization every 4 (four) hours as needed for wheezing or shortness of breath.   ARIPiprazole 5 MG tablet Commonly known as: Abilify Take 1 tablet (5 mg total) by mouth at bedtime.   budesonide-formoterol 160-4.5 MCG/ACT inhaler Commonly known as: Symbicort Inhale 2 puffs into the lungs 2 (two) times daily.   diphenoxylate-atropine 2.5-0.025 MG tablet Commonly known as: Lomotil Take 2 tablets by mouth 4 (four) times daily as needed for diarrhea or loose stools. Started by: Claretta Fraise, MD   DULoxetine 30 MG capsule Commonly known as: Cymbalta Take 30 mg (1 capsule) by mouth daily x1 week, then increase to 60 mg (2 capsules) daily.   ibuprofen 600 MG tablet Commonly known as: ADVIL Take 600 mg by mouth every 6 (six) hours as needed.   ondansetron 8 MG disintegrating tablet Commonly known as: ZOFRAN-ODT Take 1 tablet (8 mg total) by mouth every 6 (six) hours as needed for nausea or vomiting. Started by: Claretta Fraise, MD         Follow-up: No follow-ups on file.  Claretta Fraise, M.D.

## 2021-10-02 LAB — CMP14+EGFR
ALT: 20 IU/L (ref 0–32)
AST: 26 IU/L (ref 0–40)
Albumin/Globulin Ratio: 2.1 (ref 1.2–2.2)
Albumin: 5 g/dL — ABNORMAL HIGH (ref 3.8–4.8)
Alkaline Phosphatase: 108 IU/L (ref 44–121)
BUN/Creatinine Ratio: 15 (ref 9–23)
BUN: 10 mg/dL (ref 6–24)
Bilirubin Total: 0.3 mg/dL (ref 0.0–1.2)
CO2: 22 mmol/L (ref 20–29)
Calcium: 9.8 mg/dL (ref 8.7–10.2)
Chloride: 103 mmol/L (ref 96–106)
Creatinine, Ser: 0.66 mg/dL (ref 0.57–1.00)
Globulin, Total: 2.4 g/dL (ref 1.5–4.5)
Glucose: 107 mg/dL — ABNORMAL HIGH (ref 70–99)
Potassium: 4.4 mmol/L (ref 3.5–5.2)
Sodium: 142 mmol/L (ref 134–144)
Total Protein: 7.4 g/dL (ref 6.0–8.5)
eGFR: 113 mL/min/{1.73_m2} (ref >59)

## 2021-10-02 LAB — CBC WITH DIFFERENTIAL/PLATELET
Basophils Absolute: 0.1 10*3/uL (ref 0.0–0.2)
Basos: 1 %
EOS (ABSOLUTE): 0 10*3/uL (ref 0.0–0.4)
Eos: 1 %
Hematocrit: 44.2 % (ref 34.0–46.6)
Hemoglobin: 15.5 g/dL (ref 11.1–15.9)
Immature Grans (Abs): 0 10*3/uL (ref 0.0–0.1)
Immature Granulocytes: 0 %
Lymphocytes Absolute: 2.3 10*3/uL (ref 0.7–3.1)
Lymphs: 28 %
MCH: 32.2 pg (ref 26.6–33.0)
MCHC: 35.1 g/dL (ref 31.5–35.7)
MCV: 92 fL (ref 79–97)
Monocytes Absolute: 0.6 10*3/uL (ref 0.1–0.9)
Monocytes: 7 %
Neutrophils Absolute: 5.2 10*3/uL (ref 1.4–7.0)
Neutrophils: 63 %
Platelets: 358 10*3/uL (ref 150–450)
RBC: 4.82 x10E6/uL (ref 3.77–5.28)
RDW: 12.7 % (ref 11.7–15.4)
WBC: 8.2 10*3/uL (ref 3.4–10.8)

## 2021-10-02 LAB — LIPASE: Lipase: 17 U/L (ref 14–72)

## 2021-10-02 NOTE — Progress Notes (Signed)
Hello [place],  Your lab result is normal and/or stable.Some minor variations that are not significant are commonly marked abnormal, but do not represent any medical problem for you.  Best regards, Mechele Claude, M.D.

## 2021-10-02 NOTE — Telephone Encounter (Signed)
I can document what she wants and just let her know that there are risks to not treating sleep apnea  Risk of heart attack, risk of strokes, if she drives while sleepy and causes an accident.  Oxygen supplementation does not treat sleep apnea    We can order an overnight oximetry to qualify for oxygen supplementation, noting patient is aware of risks and elects for oxygen. We usually will need another oximetry to document that the oxygen supplementation is helping

## 2021-10-02 NOTE — Telephone Encounter (Signed)
I called the patient and she wants to know if you are willing to just let her have oxygen at night and she is not wanting to make a follow up as of yet. Please advise if ok to send in an oxygen order.

## 2021-10-03 LAB — URINE CULTURE

## 2021-10-04 NOTE — Telephone Encounter (Signed)
I called the patient and let her know that she would need an overnight Oxemity and per Dr. Val Eagle this would not treat her sleep apnea. She was agreeable to the ONO.  I have placed the order per provider recommendations. Nothing further needed.

## 2021-10-07 ENCOUNTER — Telehealth: Payer: Self-pay | Admitting: Pulmonary Disease

## 2021-10-07 NOTE — Telephone Encounter (Signed)
ATC LVMTCB x 1  

## 2021-10-07 NOTE — Telephone Encounter (Signed)
Called and spoke with Burna Mortimer from [facility] about the recent sleep studies that pt did. Stated to her after pt had a HST, pt did have an in-lab study due to pt having medicaid and stated to her the results of that in-lab study.  Per Burna Mortimer, with pt having the diagnosis of OSA and since pt has medicaid, pt MUST try an auto cpap machine prior to [facility] paying for oxygen. I stated to Burna Mortimer the info that pt was not able to tolerate CPAP/BIPAP during the titration when pt had the in-lab study performed and she stated that even though pt was not able to tolerate it during the study, medicaid still requires patients to try an auto cpap machine prior to [facility] paying for oxygen.  Routing this to Dr. Val Eagle.

## 2021-10-07 NOTE — Addendum Note (Signed)
Addended by: Arvilla Market on: 10/07/2021 09:46 AM   Modules accepted: Orders

## 2021-10-09 ENCOUNTER — Other Ambulatory Visit: Payer: Self-pay

## 2021-10-09 ENCOUNTER — Encounter: Payer: Self-pay | Admitting: Pulmonary Disease

## 2021-10-09 ENCOUNTER — Telehealth (INDEPENDENT_AMBULATORY_CARE_PROVIDER_SITE_OTHER): Payer: Medicaid Other | Admitting: Pulmonary Disease

## 2021-10-09 DIAGNOSIS — G4733 Obstructive sleep apnea (adult) (pediatric): Secondary | ICD-10-CM | POA: Diagnosis not present

## 2021-10-09 NOTE — Telephone Encounter (Signed)
Spoke with the pt and notified of response per AO  I scheduled her appt for video visit to discuss further per last phone note  Nothing further needed

## 2021-10-09 NOTE — Patient Instructions (Signed)
Referral to ENT for evaluation for options of treatment  Intolerant of CPAP during the evaluation, does have severe sleep apnea  Patient medically as she is on Medicaid and cannot afford to pay for anything

## 2021-10-09 NOTE — Telephone Encounter (Signed)
Unfortunately, there are no ways around some of these rules  Patient may elect to pay for oxygen supplementation out-of-pocket or try to go with the rules in place.  Not a lot of other options for me to offer.  In the short and longer term-weight loss efforts may help the oxygen levels at night and also number of events, sleeping with the head of the bed elevated about 30 degrees may help

## 2021-10-09 NOTE — Progress Notes (Deleted)
.  virt 

## 2021-10-09 NOTE — Progress Notes (Signed)
Virtual Visit via Telephone Note  I connected with Alice Santiago on 10/09/21 at 11:45 AM EST by telephone and verified that I am speaking with the correct person using two identifiers.  Location: Patient: At home Provider: [address]   I discussed the limitations, risks, security and privacy concerns of performing an evaluation and management service by telephone and the availability of in person appointments. I also discussed with the patient that there may be a patient responsible charge related to this service. The patient expressed understanding and agreed to proceed.   History of Present Illness: Patient with severe obstructive sleep apnea Did not tolerate CPAP titration  On discussions today she felt the pressure was just too much and there is no way she will be able to tolerated  Patient wondered about being able to use oxygen supplementation I had discussed with her in chart messages that this would not be optimal for the treatment of severe obstructive sleep apnea, may help with the oxygen not desaturate much but we will not treat sleep apnea  Patient's insurance will not cover oxygen supplementation for sleep apnea treatment  We had a conversation about this over the phone today, patient states she will not be able to afford to pay for oxygen out-of-pocket  Other options of treatment for sleep apnea discussed   Observations/Objective: Sounds well on the phone  Assessment and Plan: Severe obstructive sleep apnea  Follow Up Instructions: Referral to ENT for evaluation for possible other interventions for sleep apnea treatment  I discussed the assessment and treatment plan with the patient. The patient was provided an opportunity to ask questions and all were answered. The patient agreed with the plan and demonstrated an understanding of the instructions.   The patient was advised to call back or seek an in-person evaluation if the symptoms worsen or if the  condition fails to improve as anticipated.  I provided 20 minutes of non-face-to-face time during this encounter.  I did review recent sleep study and discussed this with patient   Tomma Lightning, MD

## 2021-12-19 ENCOUNTER — Other Ambulatory Visit: Payer: Self-pay | Admitting: Family Medicine

## 2021-12-19 DIAGNOSIS — F411 Generalized anxiety disorder: Secondary | ICD-10-CM

## 2022-01-08 DIAGNOSIS — H04123 Dry eye syndrome of bilateral lacrimal glands: Secondary | ICD-10-CM | POA: Diagnosis not present

## 2022-02-28 DIAGNOSIS — R221 Localized swelling, mass and lump, neck: Secondary | ICD-10-CM | POA: Diagnosis not present

## 2022-02-28 DIAGNOSIS — J341 Cyst and mucocele of nose and nasal sinus: Secondary | ICD-10-CM | POA: Diagnosis not present

## 2022-02-28 DIAGNOSIS — R0602 Shortness of breath: Secondary | ICD-10-CM | POA: Diagnosis not present

## 2022-04-07 ENCOUNTER — Other Ambulatory Visit: Payer: Self-pay | Admitting: Family Medicine

## 2022-04-07 DIAGNOSIS — Z1231 Encounter for screening mammogram for malignant neoplasm of breast: Secondary | ICD-10-CM

## 2022-05-28 ENCOUNTER — Ambulatory Visit
Admission: RE | Admit: 2022-05-28 | Discharge: 2022-05-28 | Disposition: A | Discharge status: Home / Self Care | Payer: Medicaid Other | Source: Ambulatory Visit | Attending: Family Medicine | Admitting: Family Medicine

## 2022-05-28 ENCOUNTER — Ambulatory Visit (INDEPENDENT_AMBULATORY_CARE_PROVIDER_SITE_OTHER): Payer: Medicaid Other

## 2022-05-28 DIAGNOSIS — Z1231 Encounter for screening mammogram for malignant neoplasm of breast: Secondary | ICD-10-CM | POA: Diagnosis not present

## 2022-05-28 DIAGNOSIS — Z23 Encounter for immunization: Secondary | ICD-10-CM | POA: Diagnosis not present

## 2022-08-07 DIAGNOSIS — N200 Calculus of kidney: Secondary | ICD-10-CM | POA: Diagnosis not present

## 2022-08-07 DIAGNOSIS — R079 Chest pain, unspecified: Secondary | ICD-10-CM | POA: Diagnosis not present

## 2022-08-07 DIAGNOSIS — R0789 Other chest pain: Secondary | ICD-10-CM | POA: Diagnosis not present

## 2022-08-07 DIAGNOSIS — R109 Unspecified abdominal pain: Secondary | ICD-10-CM | POA: Diagnosis not present

## 2022-08-11 ENCOUNTER — Telehealth: Payer: Self-pay | Admitting: Family Medicine

## 2022-08-11 NOTE — Telephone Encounter (Signed)
Pt called requesting to speak with nurse. Says she went to the ER and they stuck her with IV but now she has this huge bruise that keeps getting worse and is worried about it.

## 2022-08-11 NOTE — Telephone Encounter (Signed)
Patient aware and verbalized understanding. °

## 2022-08-25 ENCOUNTER — Ambulatory Visit: Payer: Medicaid Other | Admitting: Family Medicine

## 2022-10-22 ENCOUNTER — Other Ambulatory Visit: Payer: Self-pay | Admitting: Allergy & Immunology

## 2022-10-22 DIAGNOSIS — R059 Cough, unspecified: Secondary | ICD-10-CM | POA: Diagnosis not present

## 2022-10-22 DIAGNOSIS — R112 Nausea with vomiting, unspecified: Secondary | ICD-10-CM | POA: Diagnosis not present

## 2022-10-22 DIAGNOSIS — Z20822 Contact with and (suspected) exposure to covid-19: Secondary | ICD-10-CM | POA: Diagnosis not present

## 2022-10-27 ENCOUNTER — Encounter: Payer: Self-pay | Admitting: Internal Medicine

## 2022-10-27 ENCOUNTER — Ambulatory Visit (INDEPENDENT_AMBULATORY_CARE_PROVIDER_SITE_OTHER): Payer: Medicaid Other | Admitting: Internal Medicine

## 2022-10-27 VITALS — BP 134/88 | HR 92 | Temp 98.6°F | Resp 18 | Ht 63.0 in | Wt 210.1 lb

## 2022-10-27 DIAGNOSIS — R062 Wheezing: Secondary | ICD-10-CM

## 2022-10-27 DIAGNOSIS — J3089 Other allergic rhinitis: Secondary | ICD-10-CM

## 2022-10-27 DIAGNOSIS — F17218 Nicotine dependence, cigarettes, with other nicotine-induced disorders: Secondary | ICD-10-CM | POA: Diagnosis not present

## 2022-10-27 DIAGNOSIS — J4540 Moderate persistent asthma, uncomplicated: Secondary | ICD-10-CM | POA: Diagnosis not present

## 2022-10-27 DIAGNOSIS — R053 Chronic cough: Secondary | ICD-10-CM | POA: Diagnosis not present

## 2022-10-27 DIAGNOSIS — J302 Other seasonal allergic rhinitis: Secondary | ICD-10-CM

## 2022-10-27 MED ORDER — CETIRIZINE HCL 10 MG PO TABS: 10.0000 mg | ORAL_TABLET | Freq: Every day | ORAL | 5 refills | Status: DC

## 2022-10-27 MED ORDER — AZELASTINE HCL 0.1 % NA SOLN: 1.0000 | Freq: Two times a day (BID) | NASAL | 5 refills | Status: DC | PRN

## 2022-10-27 MED ORDER — BUDESONIDE-FORMOTEROL FUMARATE 160-4.5 MCG/ACT IN AERO: 2.0000 | INHALATION_SPRAY | Freq: Two times a day (BID) | RESPIRATORY_TRACT | 5 refills | Status: DC

## 2022-10-27 MED ORDER — ALBUTEROL SULFATE HFA 108 (90 BASE) MCG/ACT IN AERS: 2.0000 | INHALATION_SPRAY | Freq: Four times a day (QID) | RESPIRATORY_TRACT | 1 refills | Status: DC | PRN

## 2022-10-27 MED ORDER — ALBUTEROL SULFATE (2.5 MG/3ML) 0.083% IN NEBU: 2.5000 mg | INHALATION_SOLUTION | RESPIRATORY_TRACT | 1 refills | Status: DC | PRN

## 2022-10-27 NOTE — Addendum Note (Signed)
Addended by: Norville Haggard on: 10/27/2022 05:15 PM   Modules accepted: Orders

## 2022-10-27 NOTE — Patient Instructions (Addendum)
Moderate persistent asthma, uncomplicated - MDI technique discussed.   - Maintenance inhaler: restart Symbicort 156mg 2 puffs twice daily.  - Rescue inhaler: Albuterol 2 puffs via spacer or 1 vial via nebulizer every 4-6 hours as needed for respiratory symptoms of cough, shortness of breath, or wheezing Asthma control goals:  Full participation in all desired activities (may need albuterol before activity) Albuterol use two times or less a week on average (not counting use with activity) Cough interfering with sleep two times or less a month Oral steroids no more than once a year No hospitalizations  Seasonal and perennial allergic rhinitis - Positive SPT 01/2021:  grasses, ragweed, weeds, indoor molds, outdoor molds, dust mites and cat and mixed feathers - Avoidance measures provided. - Use nasal saline rinses before nose sprays such as with Neilmed Sinus Rinse.  Use distilled water.   - Use Azelastine 1-2 sprays each nostril twice daily as needed for congestion, runny nose, drainage. Aim upward and outward. - Use Zyrtec 10 mg daily as needed for runny nose, sneezing, itchy water eyes.  - Consider allergy shots as long term control of your symptoms by teaching your immune system to be more tolerant of your allergy triggers  Tobacco Use - Recommend tobacco cessation ASAP.  This is the best thing you can do at this time for your health.

## 2022-10-27 NOTE — Progress Notes (Signed)
FOLLOW UP Date of Service/Encounter:  10/27/22   Subjective:  Alice Santiago (DOB: 12-11-1980) is a 42 y.o. female who returns to the [facility] on 10/27/2022 for follow up for moderate persistent asthma and allergic rhinitis.   History obtained from: chart review and patient. Last visit was with Dr. Ernst Bowler 01/11/2021 for moderate persistent asthma, started on Symbicort and allergic rhinitis on Flonase and Zyrtec.   Reports having trouble with shortness of breath with exertion and also a chronic intermittent cough.  Unclear if she is still on Symbicort as we did not send in any refills.  Uses albuterol about twice a day some weeks. She is still smoking about 1/2 pack per day and has no interest in quitting right now but knows she needs to.  She has not had much trouble with congestion and runny nose. She uses Flonase rarely and does not like taking medications daily.   Also reports that she does not like coming for follow ups and is here today to get refills. Discussed the need for follow ups when you have chronic conditions.   Past Medical History: Past Medical History:  Diagnosis Date   Anxiety    Asthma    Back problem    Breast tenderness in female 04/23/2015   Bronchitis    BV (bacterial vaginosis) 03/14/2015   Depression    History of chlamydia 04/23/2015   Mixed hyperlipidemia 04/03/2021   Vaginal Pap smear, abnormal     Objective:  BP 134/88   Pulse 92   Temp 98.6 F (37 C)   Resp 18   Ht 5' 3"$  (1.6 m)   Wt 210 lb 2 oz (95.3 kg)   SpO2 96%   BMI 37.22 kg/m  Body mass index is 37.22 kg/m. Physical Exam: GEN: alert, well developed HEENT: clear conjunctiva, TM grey and translucent, nose with moderate inferior turbinate hypertrophy, pink nasal mucosa, clear rhinorrhea, no  cobblestoning HEART: regular rate and rhythm, no murmur LUNGS: clear to auscultation bilaterally, no coughing, unlabored respiration SKIN: no rashes or lesions  Spirometry:   Tracings reviewed. Her effort: Variable effort-results affected. FVC: 2.49L FEV1: 1.95L, 68% predicted FEV1/FVC ratio: 78% Interpretation: Spirometry consistent with possible restrictive disease.  Please see scanned spirometry results for details.  Assessment:   1. Moderate persistent asthma without complication   2. Cigarette nicotine dependence with other nicotine-induced disorder   3. Seasonal and perennial allergic rhinitis     Plan/Recommendations:  Moderate persistent asthma, uncomplicated - Uncontrolled with frequent symptoms requiring Albuterol. Spirometry with possible restriction today and smaller airway obstruction noted by [id number].  - Maintenance inhaler: restart Symbicort 183mg 2 puffs twice daily.  - Rescue inhaler: Albuterol 2 puffs via spacer or 1 vial via nebulizer every 4-6 hours as needed for respiratory symptoms of cough, shortness of breath, or wheezing Asthma control goals:  Full participation in all desired activities (may need albuterol before activity) Albuterol use two times or less a week on average (not counting use with activity) Cough interfering with sleep two times or less a month Oral steroids no more than once a year No hospitalizations  Seasonal and perennial allergic rhinitis - Controlled, does not like INCS so will try INAH PRN. - Positive SPT 01/2021:  grasses, ragweed, weeds, indoor molds, outdoor molds, dust mites and cat and mixed feathers - Avoidance measures provided. - Use nasal saline rinses before nose sprays such as with Neilmed Sinus Rinse.  Use distilled water.   - Use Azelastine  1-2 sprays each nostril twice daily as needed for congestion, runny nose, drainage. Aim upward and outward. - Use Zyrtec 10 mg daily as needed for runny nose, sneezing, itchy water eyes.  - Consider allergy shots as long term control of your symptoms by teaching your immune system to be more tolerant of your allergy triggers  Tobacco Use - Recommend  tobacco cessation ASAP.  This is the best thing you can do at this time for your health. Patient provided counseling on tobacco cessation today. Discussed the various impacts smoking has on health such as increased infections, lung disease, malignancy, heart disease, stroke etc. Patient is alert and competent.   Willingness to quit: none Methods/skills for cessation: not willing to quit Medication management for smoking: discussed thinking about the health benefits with cessation Resources provided include Quit date: none Will discuss further at next follow up visit. Spent over 3 minutes for tobacco cessation counseling.        Return in about 6 weeks (around 12/08/2022).  Harlon Flor, MD [facility]

## 2022-11-17 IMAGING — DX DG HIP (WITH OR WITHOUT PELVIS) 2-3V*R*
3 series · 3 of 3 positions shown · non-contrast
Comparison: None.

CLINICAL DATA: Right pain.

EXAM:
DG HIP (WITH OR WITHOUT PELVIS) 2-3V RIGHT

[pelvis ap]
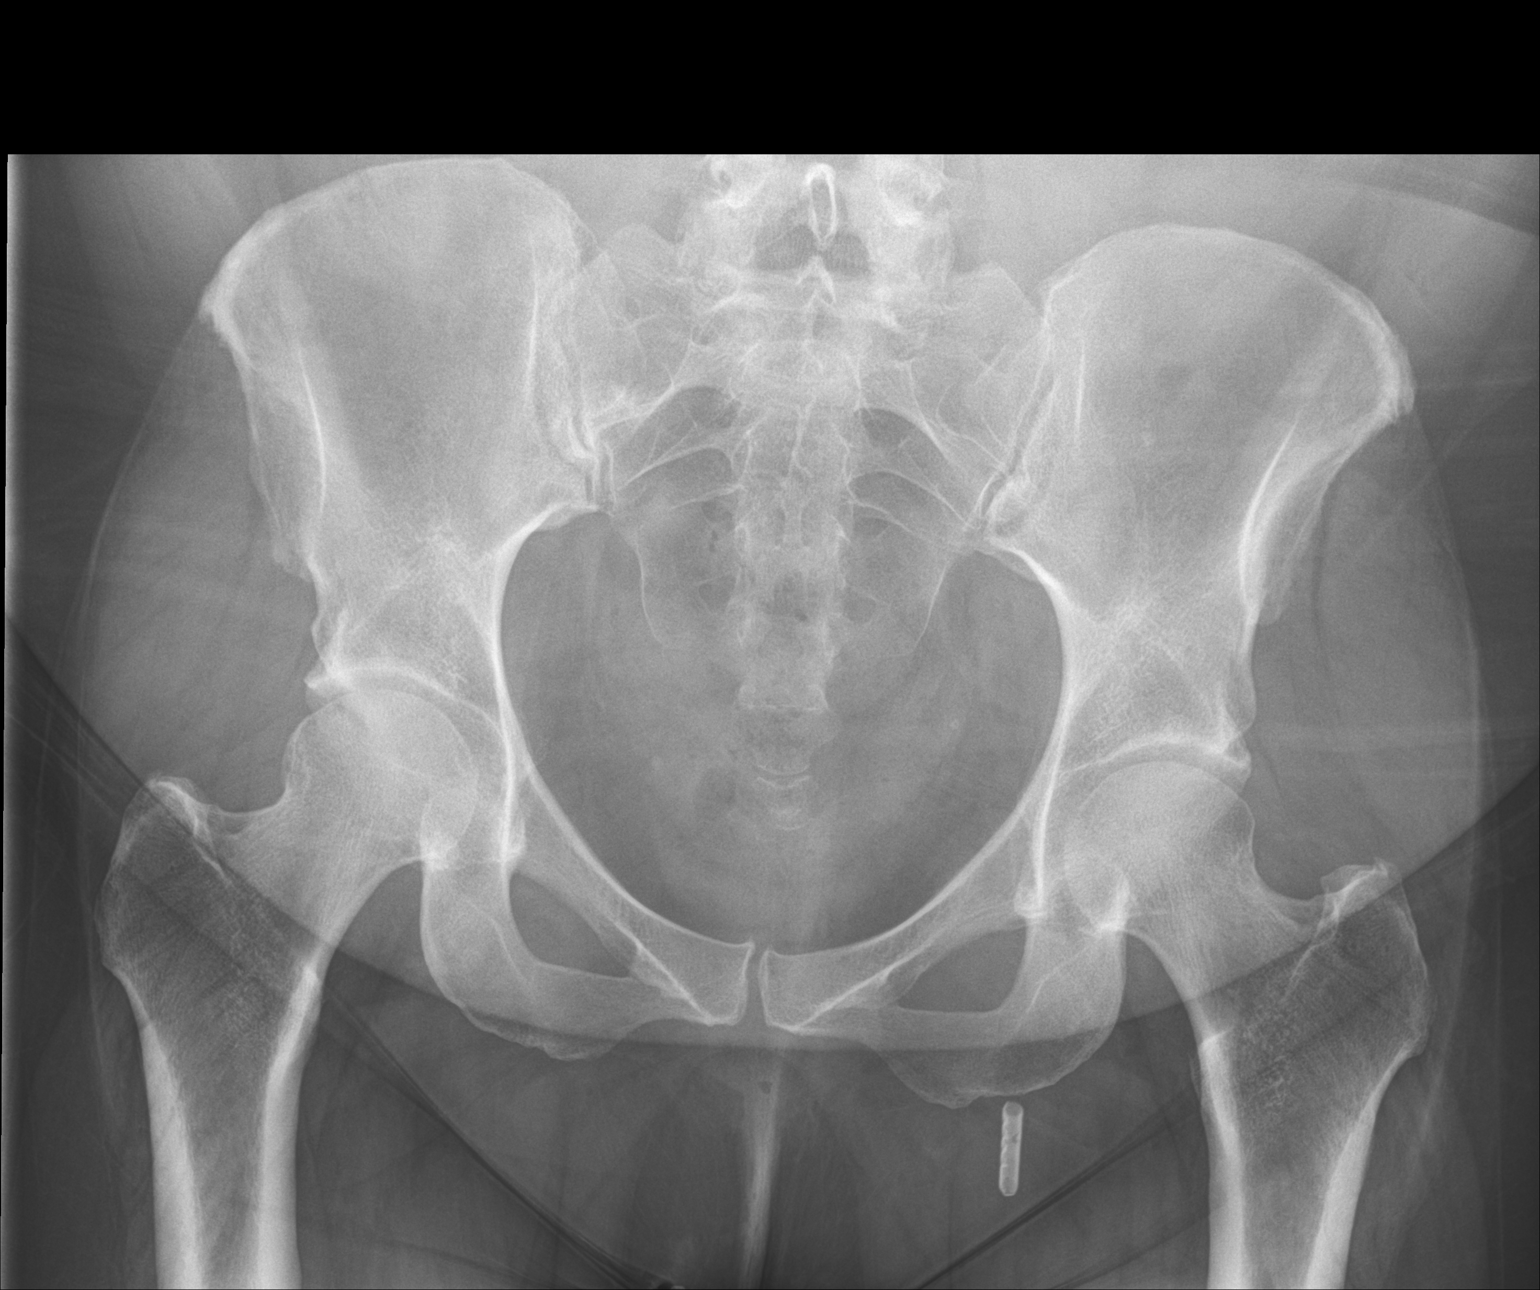

[hip ap]
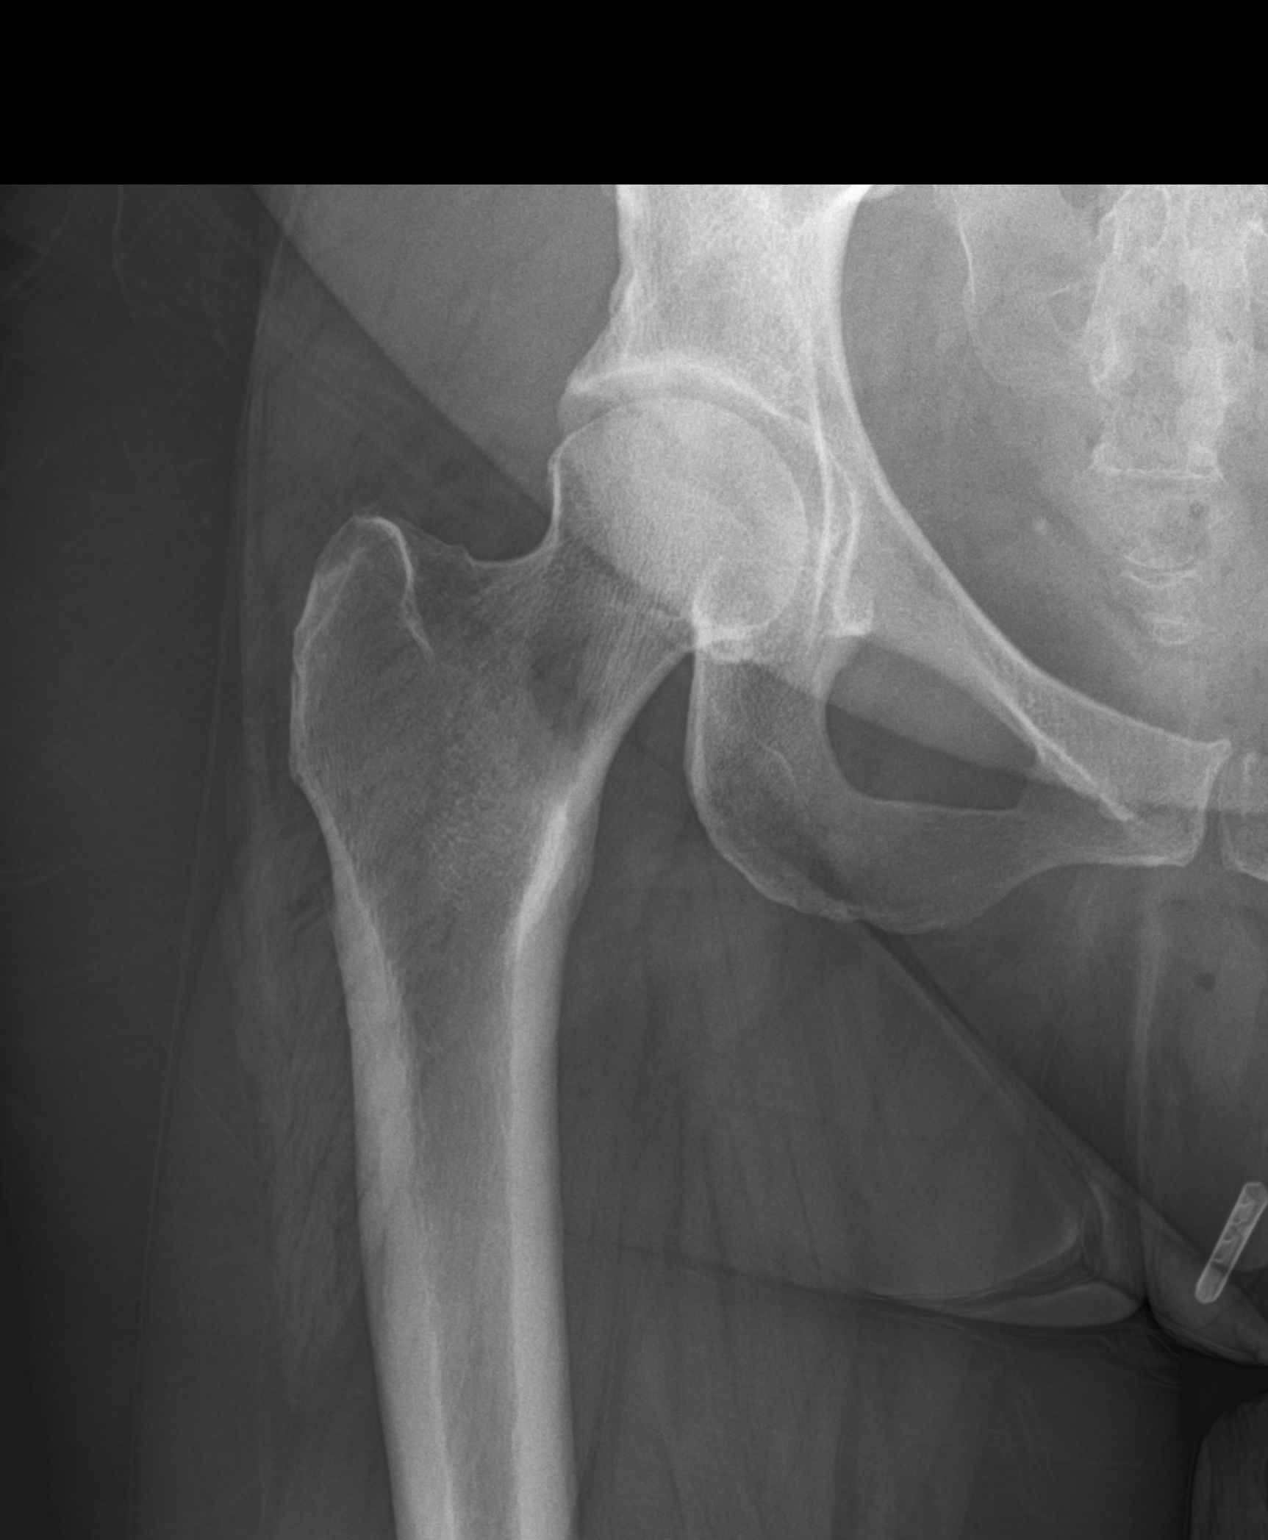

[hip lat]
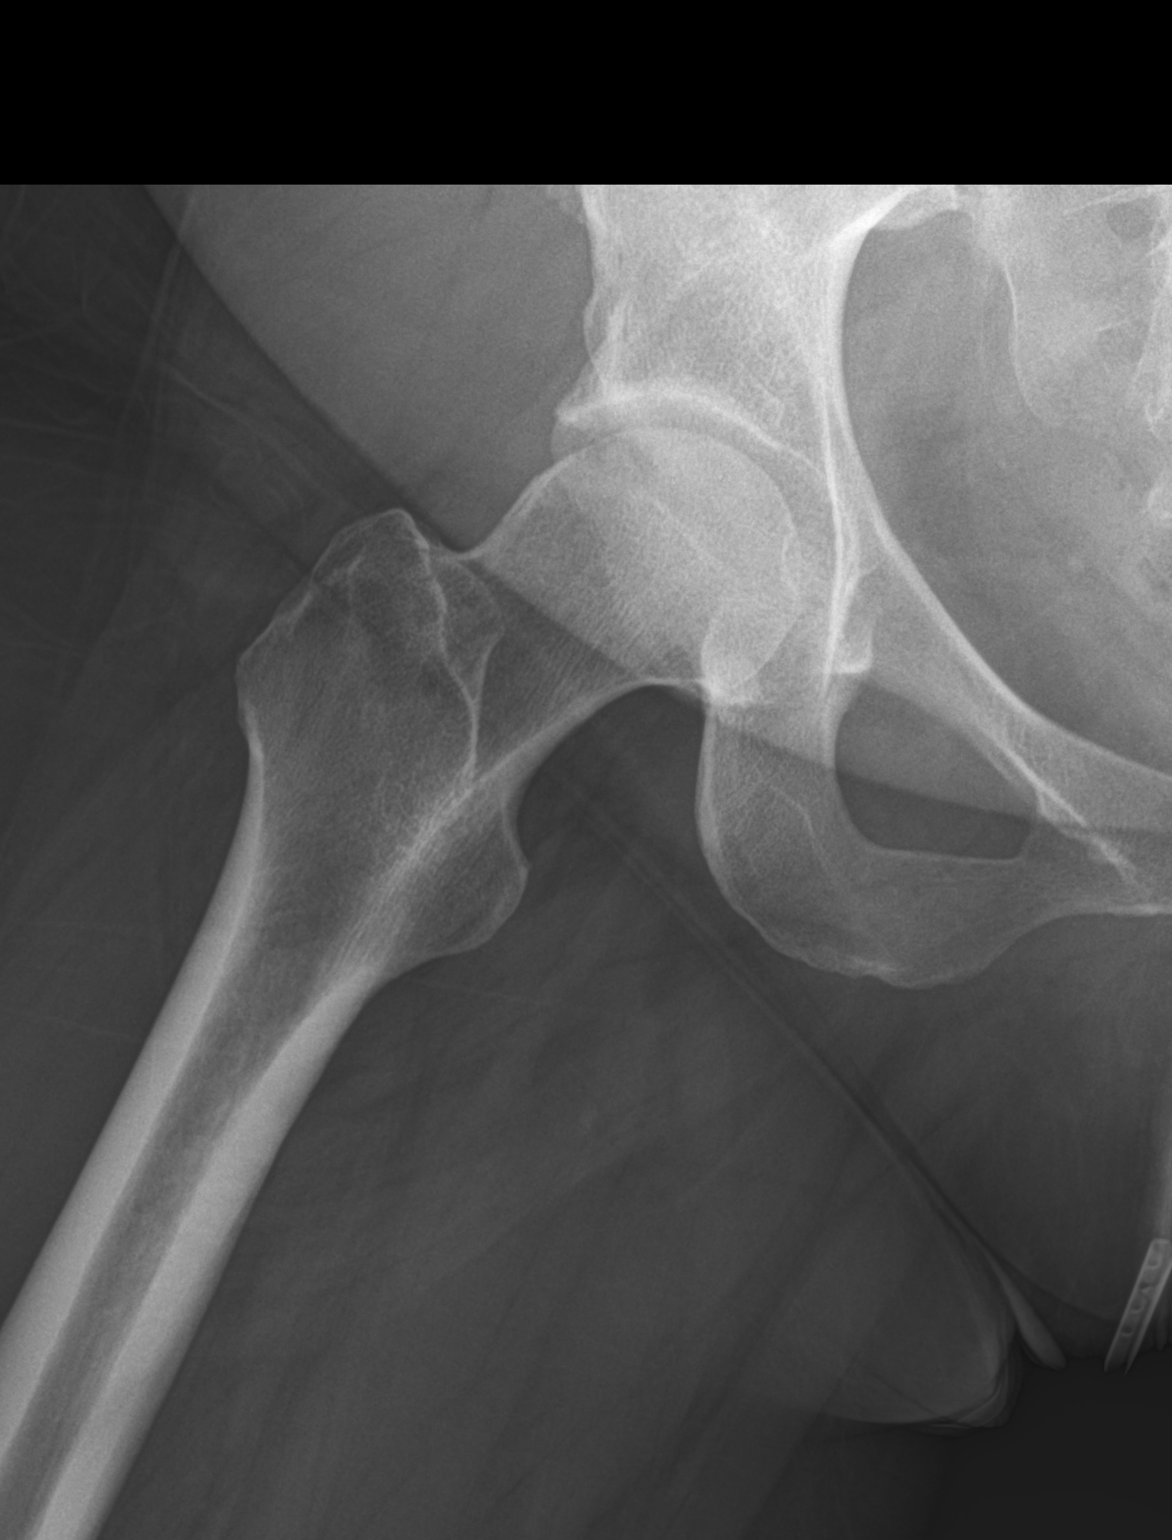

[3 of 3 positions shown; findings below may reference images not displayed]

FINDINGS: There is no evidence of hip fracture or dislocation. Mild bilateral
hip degenerative change. Tubular foreign body projects over the left
groin, possibly external to the patient.
IMPRESSION: No evidence of acute fracture or dislocation.

## 2022-11-26 ENCOUNTER — Ambulatory Visit: Payer: Medicaid Other | Admitting: Family Medicine

## 2022-11-27 ENCOUNTER — Encounter: Payer: Self-pay | Admitting: Family Medicine

## 2022-12-16 ENCOUNTER — Ambulatory Visit: Payer: Medicaid Other | Admitting: Family Medicine

## 2022-12-29 ENCOUNTER — Ambulatory Visit: Payer: Medicaid Other | Admitting: Internal Medicine

## 2022-12-31 ENCOUNTER — Other Ambulatory Visit (INDEPENDENT_AMBULATORY_CARE_PROVIDER_SITE_OTHER): Payer: Medicaid Other

## 2022-12-31 ENCOUNTER — Ambulatory Visit: Payer: Medicaid Other | Admitting: Family Medicine

## 2022-12-31 ENCOUNTER — Encounter: Payer: Self-pay | Admitting: Family Medicine

## 2022-12-31 DIAGNOSIS — R002 Palpitations: Secondary | ICD-10-CM

## 2022-12-31 DIAGNOSIS — I498 Other specified cardiac arrhythmias: Secondary | ICD-10-CM

## 2022-12-31 DIAGNOSIS — G8929 Other chronic pain: Secondary | ICD-10-CM | POA: Diagnosis not present

## 2022-12-31 DIAGNOSIS — R519 Headache, unspecified: Secondary | ICD-10-CM | POA: Diagnosis not present

## 2022-12-31 MED ORDER — PROPRANOLOL HCL 40 MG PO TABS
40.0000 mg | ORAL_TABLET | Freq: Every evening | ORAL | 0 refills | Status: DC
Start: 2022-12-31 — End: 2023-01-21

## 2022-12-31 NOTE — Progress Notes (Signed)
New Patient Office Visit  Subjective   Patient ID: Alice Santiago, female    DOB: 02-25-1981  Age: 42 y.o. MRN: [id number]  CC:  Chief Complaint  Patient presents with   Medical Management of Chronic Issues    Establish care Med refills Chronic headaches    HPI Alice M Storti presents to follow up for chronic headaches She reports that she has headaches almost daily. She cannot determine a specific trigger. Sometimes alleviated by eating, sleep and dark rooms. Reports that headaches started years ago. Has tried ibuprofen in the past. Reports that she has been using OTC Excedrin since she has been out of ibuprofen. She is using Excedrin several times per week. Points to pain in both front of forehead and back. Reports nausea with headaches to the point of dry-heaving. She previously believed that it was due to her hair being pulled back and has even tried cutting her hair to manage her headaches, which did not help.   Palpitations  Reports that she has them every now and them. They will come on while she is sitting watching TV. SOB with walking to her room from living room. Heat intolerance.  Of note, patient has sleep apnea. States that she was terrified by sleeping with machine. She wakes up diaphoretic and short of breath often. Reports that she drinks water throughout the night and continues to wake up with cotton-mouth.  Endorses dizzy spells and hot spells where she will have to cool herself down with a washcloth at least monthly.   Outpatient Encounter Medications as of 12/31/2022  Medication Sig   albuterol (PROAIR HFA) 108 (90 Base) MCG/ACT inhaler Inhale 2 puffs into the lungs every 6 (six) hours as needed for wheezing or shortness of breath.   albuterol (PROVENTIL) (2.5 MG/3ML) 0.083% nebulizer solution Take 3 mLs (2.5 mg total) by nebulization every 4 (four) hours as needed for wheezing or shortness of breath.   aspirin-acetaminophen-caffeine (EXCEDRIN MIGRAINE) 250-250-65 MG  tablet Take by mouth every 6 (six) hours as needed for headache.   budesonide-formoterol (SYMBICORT) 160-4.5 MCG/ACT inhaler Inhale 2 puffs into the lungs 2 (two) times daily.   cetirizine (ZYRTEC ALLERGY) 10 MG tablet Take 1 tablet (10 mg total) by mouth daily.   ondansetron (ZOFRAN-ODT) 8 MG disintegrating tablet Take 1 tablet (8 mg total) by mouth every 6 (six) hours as needed for nausea or vomiting.   ARIPiprazole (ABILIFY) 5 MG tablet Take 1 tablet (5 mg total) by mouth at bedtime. (NEEDS TO BE SEEN BEFORE NEXT REFILL) (Patient not taking: Reported on 10/27/2022)   azelastine (ASTELIN) 0.1 % nasal spray Place 1 spray into both nostrils 2 (two) times daily as needed for rhinitis. Use in each nostril as directed (Patient not taking: Reported on 12/31/2022)   diphenoxylate-atropine (LOMOTIL) 2.5-0.025 MG tablet Take 2 tablets by mouth 4 (four) times daily as needed for diarrhea or loose stools. (Patient not taking: Reported on 10/27/2022)   DULoxetine (CYMBALTA) 30 MG capsule Take 30 mg (1 capsule) by mouth daily x1 week, then increase to 60 mg (2 capsules) daily. (Patient not taking: Reported on 10/27/2022)   [DISCONTINUED] ibuprofen (ADVIL) 600 MG tablet Take 600 mg by mouth every 6 (six) hours as needed. (Patient not taking: Reported on 10/27/2022)   No facility-administered encounter medications on file as of 12/31/2022.   Past Medical History:  Diagnosis Date   Anxiety    Asthma    Back problem    Breast tenderness in female 04/23/2015  Bronchitis    BV (bacterial vaginosis) 03/14/2015   Depression    History of chlamydia 04/23/2015   Mixed hyperlipidemia 04/03/2021   Vaginal Pap smear, abnormal     Past Surgical History:  Procedure Laterality Date   ANTERIOR CERVICAL DECOMP/DISCECTOMY FUSION  10/08/2017   Procedure: Anterior Cervical Decompression/Discectomy Fusion - Cervical five-Cervical six;  Surgeon: Tia Alert, MD;  Location: [facility] OR;  Service: Neurosurgery;;   CESAREAN  SECTION     times 2   LUMBAR LAMINECTOMY/DECOMPRESSION MICRODISCECTOMY Left 11/10/2014   Procedure: LUMBAR LAMINECTOMY/DECOMPRESSION MICRODISCECTOMY LEFT LUMBAR FIVE-SACRAL ONE;  Surgeon: Tia Alert, MD;  Location: [facility];  Service: Neurosurgery;  Laterality: Left;  left   TONSILLECTOMY     TOTAL ABDOMINAL HYSTERECTOMY     TUBAL LIGATION      Family History  Problem Relation Age of Onset   Depression Mother    Anxiety disorder Mother    Heart murmur Father    Other Son        problems with bowels   Asthma Son    ADD / ADHD Son    ODD Son    Asthma Son    Cancer Neg Hx    Breast cancer Neg Hx     Social History   Socioeconomic History   Marital status: Single    Spouse name: Not on file   Number of children: 2   Years of education: Not on file   Highest education level: Not on file  Occupational History   Not on file  Tobacco Use   Smoking status: Every Day    Packs/day: 0.50    Years: 21.00    Additional pack years: 0.00    Total pack years: 10.50    Types: Cigarettes   Smokeless tobacco: Never  Vaping Use   Vaping Use: Former  Substance and Sexual Activity   Alcohol use: Not Currently    Comment: occ   Drug use: Yes    Types: Marijuana   Sexual activity: Not Currently    Birth control/protection: Surgical    Comment: hyst  Other Topics Concern   Not on file  Social History Narrative   Not on file   Social Determinants of Health   Financial Resource Strain: Not on file  Food Insecurity: Not on file  Transportation Needs: Not on file  Physical Activity: Not on file  Stress: Not on file  Social Connections: Not on file  Intimate Partner Violence: Not on file   ROS As per HPI  Objective   BP 123/84   Pulse 97   Temp 98.7 F (37.1 C)   Ht 5\' 3"  (1.6 m)   Wt 204 lb (92.5 kg)   SpO2 97%   BMI 36.14 kg/m   Physical Exam Constitutional:      General: She is not in acute distress.    Appearance: Normal appearance. She is not  ill-appearing, toxic-appearing or diaphoretic.  Cardiovascular:     Rate and Rhythm: Normal rate and regular rhythm.     Pulses: Normal pulses.          Radial pulses are 2+ on the right side and 2+ on the left side.       Dorsalis pedis pulses are 2+ on the right side and 2+ on the left side.     Heart sounds: Normal heart sounds. No murmur heard.    No gallop.  Pulmonary:     Effort: Pulmonary effort is normal. No respiratory  distress.     Breath sounds: Normal breath sounds. No stridor, decreased air movement or transmitted upper airway sounds. No decreased breath sounds, wheezing, rhonchi or rales.  Musculoskeletal:     Right lower leg: No edema.     Left lower leg: No edema.  Skin:    General: Skin is warm.     Capillary Refill: Capillary refill takes less than 2 seconds.  Neurological:     General: No focal deficit present.     Mental Status: She is alert and oriented to person, place, and time. Mental status is at baseline.     GCS: GCS eye subscore is 4. GCS verbal subscore is 5. GCS motor subscore is 6.     Motor: No weakness.     Coordination: Coordination is intact.     Gait: Gait is intact.  Psychiatric:        Attention and Perception: Attention and perception normal.        Mood and Affect: Mood and affect normal.        Speech: Speech normal.        Behavior: Behavior normal. Behavior is cooperative.        Thought Content: Thought content normal.        Cognition and Memory: Cognition and memory normal.        Judgment: Judgment normal.       12/31/2022    3:27 PM 10/01/2021    3:25 PM 04/02/2021   12:33 PM  Depression screen PHQ 2/9  Decreased Interest 1 0 1  Down, Depressed, Hopeless 1 0 1  PHQ - 2 Score 2 0 2  Altered sleeping 1  3  Tired, decreased energy 1  1  Change in appetite 1  1  Feeling bad or failure about yourself  0  1  Trouble concentrating 0  1  Moving slowly or fidgety/restless 1  0  Suicidal thoughts   0  PHQ-9 Score 6  9  Difficult  doing work/chores   Not difficult at all      12/31/2022    3:28 PM 04/02/2021   12:34 PM 08/23/2019    4:16 PM  GAD 7 : Generalized Anxiety Score  Nervous, Anxious, on Edge 0 3 2  Control/stop worrying 0 0 3  Worry too much - different things 1 0 3  Trouble relaxing 0 0 2  Restless 0 0 2  Easily annoyed or irritable 0 1 3  Afraid - awful might happen 0 0 0  Total GAD 7 Score 1 4 15   Anxiety Difficulty  Very difficult Somewhat difficult   Assessment & Plan:  1. Chronic nonintractable headache, unspecified headache type Medication as below for daily prevention of chronic headaches.  Educated patient on the use of ibuprofen and the risk of rebound headaches. Would like patient to follow up in 1 month for review of medication and symptoms.  - aspirin-acetaminophen-caffeine (EXCEDRIN MIGRAINE) 250-250-65 MG tablet; Take by mouth every 6 (six) hours as needed for headache. - propranolol (INDERAL) 40 MG tablet; Take 1 tablet (40 mg total) by mouth at bedtime.  Dispense: 30 tablet; Refill: 0  2. Palpitations Labs as below. Will communicate results to patient once available.  Not fasting today, did not add on lipid panel.  Zio placed as below  Will monitor for abnormalities.  Propanolol for dual therapy of migraine prevention and palpitation therapy. Discussed side effects of medication with patient.  Reviewed EKG in office. No significant abnormalities.  -  CMP14+EGFR - TSH - VITAMIN D 25 Hydroxy (Vit-D Deficiency, Fractures) - EKG 12-Lead - Anemia Profile B - LONG TERM MONITOR (3-14 DAYS); Future - propranolol (INDERAL) 40 MG tablet; Take 1 tablet (40 mg total) by mouth at bedtime.  Dispense: 30 tablet; Refill: 0  The above assessment and management plan was discussed with the patient. The patient verbalized understanding of and has agreed to the management plan using shared-decision making. Patient is aware to call the clinic if they develop any new symptoms or if symptoms fail to  improve or worsen. Patient is aware when to return to the clinic for a follow-up visit. Patient educated on when it is appropriate to go to the emergency department.   Neale Burly, DNP-FNP [facility] [address] [place], [place] [address] [phone]

## 2023-01-01 LAB — ANEMIA PROFILE B
Basophils Absolute: 0.1 10*3/uL (ref 0.0–0.2)
Basos: 1 %
EOS (ABSOLUTE): 0.1 10*3/uL (ref 0.0–0.4)
Eos: 1 %
Ferritin: 148 ng/mL (ref 15–150)
Folate: 10.4 ng/mL (ref >3.0)
Hematocrit: 44.1 % (ref 34.0–46.6)
Hemoglobin: 15 g/dL (ref 11.1–15.9)
Immature Grans (Abs): 0 10*3/uL (ref 0.0–0.1)
Immature Granulocytes: 0 %
Iron Saturation: 20 % (ref 15–55)
Iron: 67 ug/dL (ref 27–159)
Lymphocytes Absolute: 2.5 10*3/uL (ref 0.7–3.1)
Lymphs: 27 %
MCH: 31.3 pg (ref 26.6–33.0)
MCHC: 34 g/dL (ref 31.5–35.7)
MCV: 92 fL (ref 79–97)
Monocytes Absolute: 0.6 10*3/uL (ref 0.1–0.9)
Monocytes: 6 %
Neutrophils Absolute: 5.8 10*3/uL (ref 1.4–7.0)
Neutrophils: 65 %
Platelets: 306 10*3/uL (ref 150–450)
RBC: 4.8 x10E6/uL (ref 3.77–5.28)
RDW: 12.7 % (ref 11.7–15.4)
Retic Ct Pct: 1.4 % (ref 0.6–2.6)
Total Iron Binding Capacity: 334 ug/dL (ref 250–450)
UIBC: 267 ug/dL (ref 131–425)
Vitamin B-12: 366 pg/mL (ref 232–1245)
WBC: 9 10*3/uL (ref 3.4–10.8)

## 2023-01-01 LAB — CMP14+EGFR
ALT: 10 IU/L (ref 0–32)
AST: 15 IU/L (ref 0–40)
Albumin/Globulin Ratio: 1.7 (ref 1.2–2.2)
Albumin: 4.4 g/dL (ref 3.9–4.9)
Alkaline Phosphatase: 108 IU/L (ref 44–121)
BUN/Creatinine Ratio: 12 (ref 9–23)
BUN: 8 mg/dL (ref 6–24)
Bilirubin Total: 0.3 mg/dL (ref 0.0–1.2)
CO2: 20 mmol/L (ref 20–29)
Calcium: 9.2 mg/dL (ref 8.7–10.2)
Chloride: 106 mmol/L (ref 96–106)
Creatinine, Ser: 0.66 mg/dL (ref 0.57–1.00)
Globulin, Total: 2.6 g/dL (ref 1.5–4.5)
Glucose: 95 mg/dL (ref 70–99)
Potassium: 4.2 mmol/L (ref 3.5–5.2)
Sodium: 142 mmol/L (ref 134–144)
Total Protein: 7 g/dL (ref 6.0–8.5)
eGFR: 112 mL/min/{1.73_m2} (ref >59)

## 2023-01-01 LAB — TSH: TSH: 2.05 u[IU]/mL (ref 0.450–4.500)

## 2023-01-01 LAB — VITAMIN D 25 HYDROXY (VIT D DEFICIENCY, FRACTURES): Vit D, 25-Hydroxy: 11.7 ng/mL — ABNORMAL LOW (ref 30.0–100.0)

## 2023-01-02 MED ORDER — VITAMIN D (ERGOCALCIFEROL) 1.25 MG (50000 UNIT) PO CAPS: 50000.0000 [IU] | ORAL_CAPSULE | ORAL | 0 refills | Status: AC

## 2023-01-02 NOTE — Addendum Note (Signed)
Addended by: Neale Burly on: 01/02/2023 12:31 PM   Modules accepted: Orders

## 2023-01-21 ENCOUNTER — Telehealth: Payer: Self-pay | Admitting: Family Medicine

## 2023-01-21 NOTE — Addendum Note (Signed)
Addended by: Neale Burly on: 01/21/2023 04:15 PM   Modules accepted: Orders

## 2023-01-21 NOTE — Telephone Encounter (Signed)
Patient has seen results of her heart monitor but does not understand is asking if you will call her to explain.  Your notes are:    Patient has slight ectopic beats and due to idioventricular rhythm and Second Degree AV Block-Mobitz I being present recommend discontinuing the propranolol at this time and will refer to cardiology for additional work up. Recommend avoiding stimulants such as caffeine due to ectopic beats.

## 2023-01-21 NOTE — Telephone Encounter (Signed)
Patient calling because she recently started using a heart monitor and does not understand results.

## 2023-01-22 ENCOUNTER — Telehealth: Payer: Self-pay

## 2023-01-22 DIAGNOSIS — R112 Nausea with vomiting, unspecified: Secondary | ICD-10-CM

## 2023-01-22 MED ORDER — ONDANSETRON HCL 4 MG PO TABS: 4.0000 mg | ORAL_TABLET | Freq: Three times a day (TID) | ORAL | 0 refills | Status: DC | PRN

## 2023-01-22 NOTE — Telephone Encounter (Signed)
Received a triage call- patient states she spoke to PCP yesterday about her heart monitor results.  States she told you that she has not been feeling good the past few days and she was told to just not take anything with caffeine in it.  Patient states she has been having headaches, nausea and fatigue that has been going on the last few days.  Today her headache is worse than it has even been since she was unsure what to take.  She just took 1 tablet for Aleve 10 mins ago since the pain was so bad.  She would like to know what she can take otc that will help with her headache?  Also would like a refill zofran that she was prescribed in the hospital since she is having ongoing nausea.  If approved would like to be sent to [facility].    Please review and advise.

## 2023-01-22 NOTE — Addendum Note (Signed)
Addended by: Neale Burly on: 01/22/2023 04:23 PM   Modules accepted: Orders

## 2023-01-23 NOTE — Telephone Encounter (Signed)
Patient picked up zofran and made appt with [facility] 01/30/23

## 2023-01-30 ENCOUNTER — Ambulatory Visit: Payer: Medicaid Other | Admitting: Family Medicine

## 2023-01-30 ENCOUNTER — Encounter: Payer: Self-pay | Admitting: Family Medicine

## 2023-01-30 VITALS — BP 109/73 | HR 109 | Temp 98.7°F | Ht 63.0 in | Wt 204.0 lb

## 2023-01-30 DIAGNOSIS — F339 Major depressive disorder, recurrent, unspecified: Secondary | ICD-10-CM | POA: Diagnosis not present

## 2023-01-30 DIAGNOSIS — B081 Molluscum contagiosum: Secondary | ICD-10-CM

## 2023-01-30 DIAGNOSIS — R519 Headache, unspecified: Secondary | ICD-10-CM

## 2023-01-30 DIAGNOSIS — R112 Nausea with vomiting, unspecified: Secondary | ICD-10-CM | POA: Diagnosis not present

## 2023-01-30 DIAGNOSIS — I499 Cardiac arrhythmia, unspecified: Secondary | ICD-10-CM

## 2023-01-30 DIAGNOSIS — F411 Generalized anxiety disorder: Secondary | ICD-10-CM

## 2023-01-30 DIAGNOSIS — R232 Flushing: Secondary | ICD-10-CM | POA: Diagnosis not present

## 2023-01-30 DIAGNOSIS — G8929 Other chronic pain: Secondary | ICD-10-CM | POA: Diagnosis not present

## 2023-01-30 MED ORDER — DULOXETINE HCL 30 MG PO CPEP
30.0000 mg | ORAL_CAPSULE | Freq: Every day | ORAL | 0 refills | Status: DC
Start: 2023-01-30 — End: 2023-02-11

## 2023-01-30 NOTE — Patient Instructions (Signed)
Take Nurtec tablet at the start of symptoms of migraine headaches with

## 2023-01-30 NOTE — Progress Notes (Unsigned)
   Acute Office Visit  Subjective:  Patient ID: Alice Santiago, female    DOB: 1980-09-11, 42 y.o.   MRN: [id number]  Chief Complaint  Patient presents with   Acute Visit    Headaches, nausea, 'swimmy' head   HPI Patient is in today for follow up of anxiety, depression and for new diagnosis of arrhythmia.  She states that she was looking forward to her son's award ceremony all week and then was not able to go yesterday because she woke up and was not feeling well. She went to the bathroom and she started dry heaving. She is very concerned about her heart. She has cut out all caffeine. She is worried about her son. He is graduating soon and she is a single mother. States that she is trying to stay calm and relax. States that yesterday her head was "swimmy feeling" and she laid down. She did not get up until 10 am. States that it happened when she went to stand up. She has been sleeping with a wet washcltoh to help her from sweating at night. Reports that she will get up in the middle of the night   Reports that her father passed when he was 30 of a heart attack and she was told he had a murmur. Additionally family history with her uncle and cousin.   Depression  States that she has had a lot of trauma. Her father passed at 65. Her mom "took her son" from her at 33. She states that he is now married with 2 kids and is not able to see him. She was able to see him in the past. States that she tries to keep herself in her house away from people because "people use it against you" She reports that she was homeless for a time.   ROS As per HPI  Objective:  BP 109/73   Pulse (!) 109   Temp 98.7 F (37.1 C)   Ht 5\' 3"  (1.6 m)   Wt 204 lb (92.5 kg)   SpO2 97%   BMI 36.14 kg/m    Physical Exam     Assessment & Plan:    The above assessment and management plan was discussed with the patient. The patient verbalized understanding of and has agreed to the management plan using  shared-decision making. Patient is aware to call the clinic if they develop any new symptoms or if symptoms fail to improve or worsen. Patient is aware when to return to the clinic for a follow-up visit. Patient educated on when it is appropriate to go to the emergency department.   Return in about 4 weeks (around 02/27/2023) for Chronic Condition Follow up.  Neale Burly, DNP-FNP [facility] [address] [place], [place] [address] [phone]

## 2023-02-09 ENCOUNTER — Encounter: Payer: Self-pay | Admitting: Nurse Practitioner

## 2023-02-09 ENCOUNTER — Ambulatory Visit: Payer: Medicaid Other | Attending: Nurse Practitioner | Admitting: Nurse Practitioner

## 2023-02-09 VITALS — BP 142/98 | HR 80 | Ht 63.0 in | Wt 207.8 lb

## 2023-02-09 DIAGNOSIS — R030 Elevated blood-pressure reading, without diagnosis of hypertension: Secondary | ICD-10-CM | POA: Diagnosis present

## 2023-02-09 DIAGNOSIS — R5383 Other fatigue: Secondary | ICD-10-CM

## 2023-02-09 DIAGNOSIS — R0609 Other forms of dyspnea: Secondary | ICD-10-CM | POA: Diagnosis present

## 2023-02-09 DIAGNOSIS — F1210 Cannabis abuse, uncomplicated: Secondary | ICD-10-CM

## 2023-02-09 DIAGNOSIS — Z720 Tobacco use: Secondary | ICD-10-CM

## 2023-02-09 DIAGNOSIS — R42 Dizziness and giddiness: Secondary | ICD-10-CM | POA: Diagnosis present

## 2023-02-09 DIAGNOSIS — E669 Obesity, unspecified: Secondary | ICD-10-CM | POA: Diagnosis present

## 2023-02-09 DIAGNOSIS — I498 Other specified cardiac arrhythmias: Secondary | ICD-10-CM | POA: Diagnosis not present

## 2023-02-09 DIAGNOSIS — R002 Palpitations: Secondary | ICD-10-CM | POA: Diagnosis present

## 2023-02-09 DIAGNOSIS — E782 Mixed hyperlipidemia: Secondary | ICD-10-CM | POA: Diagnosis present

## 2023-02-09 MED ORDER — BD ASSURE BPM/AUTO ARM CUFF MISC: 0 refills | Status: AC

## 2023-02-09 NOTE — Progress Notes (Signed)
Office Visit    Patient Name: Alice M Maiello Date of Encounter: 02/09/2023  PCP:  Arrie Senate, FNP   [facility]  Cardiologist:  Nona Dell, MD  Advanced Practice Provider:  No care team member to display Electrophysiologist:  None   Chief Complaint    Alice MALEYNA Santiago is a 42 y.o. female with a hx of chest pain, mixed hyperlipidemia, DOE, history of fatigue, longstanding tobacco abuse, and at risk for OSA, anxiety, depression, who presents today for arrhythmia evaluation.  Past Medical History    Past Medical History:  Diagnosis Date   Anxiety    Asthma    Back problem    Breast tenderness in female 04/23/2015   Bronchitis    BV (bacterial vaginosis) 03/14/2015   Depression    History of chlamydia 04/23/2015   Mixed hyperlipidemia 04/03/2021   Vaginal Pap smear, abnormal    Past Surgical History:  Procedure Laterality Date   ANTERIOR CERVICAL DECOMP/DISCECTOMY FUSION  10/08/2017   Procedure: Anterior Cervical Decompression/Discectomy Fusion - Cervical five-Cervical six;  Surgeon: Tia Alert, MD;  Location: [facility] OR;  Service: Neurosurgery;;   CESAREAN SECTION     times 2   LUMBAR LAMINECTOMY/DECOMPRESSION MICRODISCECTOMY Left 11/10/2014   Procedure: LUMBAR LAMINECTOMY/DECOMPRESSION MICRODISCECTOMY LEFT LUMBAR FIVE-SACRAL ONE;  Surgeon: Tia Alert, MD;  Location: [facility];  Service: Neurosurgery;  Laterality: Left;  left   TONSILLECTOMY     TOTAL ABDOMINAL HYSTERECTOMY     TUBAL LIGATION      Allergies  No Known Allergies  History of Present Illness    Alice SHERECE HOPPER is a 42 y.o. female with a PMH as mentioned above.  Last seen by Dr. Diona Browner on May 17, 2021.  Has a significant family history of cardiovascular disease.  Her father died at 35 years old with heart attack, he reportedly had heart murmur as well.  At that time, she reported chronic shortness of breath with activity x 1 year, worse with inclines,  also noted feeling lightheaded with extreme activities.  At the time, she was planning on undergoing home sleep study evaluation for OSA.  Myoview was arranged and was normal.  Echocardiogram unremarkable.  I was recently contacted by patient's PCP Aleen Campi, FNP) who she saw on 01/30/2023.  She reported nausea and vomiting.  Underwent cardiac monitor with PCP that revealed predominantly sinus rhythm, 1 run of SVT lasting 4 beats, 1 episode of what appears to be winky block was present, idioventricular rhythm was also noted.  She has been referred to see outpatient cardiology for evaluation.  Today she presents for arrhythmia evaluation.  She states she has occasional palpitations. Especially notices palpitations with doing ADL's, feels lightheaded and winded with this. Does not wear CPAP for OSA, says she could not complete sleep study in the past. Has been weaning off caffeine use. Denies any chest pain, worsening shortness of breath, syncope, presyncope, orthopnea, PND, swelling or significant weight changes, acute bleeding, or claudication. Denies any alcohol use. Smokes around 1/2 PPD, smokes marijuana occasionally. Does not check BP at home.    EKGs/Labs/Other Studies Reviewed:   The following studies were reviewed today:   EKG:  EKG is not ordered today.    Cardiac monitor 01/2023:  Patient has slight ectopic beats and due to idioventricular rhythm and Second Degree AV Block-Mobitz I being present recommend discontinuing the propranolol at this time and will refer to cardiology for additional work up.  Echo 06/2021:  1. Left ventricular ejection fraction, by estimation, is 60 to 65%. The  left ventricle has normal function. The left ventricle has no regional  wall motion abnormalities. There is mild left ventricular hypertrophy.  Left ventricular diastolic parameters  were normal.   2. Right ventricular systolic function is normal. The right ventricular  size is normal. Tricuspid  regurgitation signal is inadequate for assessing  PA pressure.   3. The mitral valve is grossly normal. Trivial mitral valve  regurgitation.   4. The aortic valve is tricuspid. Aortic valve regurgitation is not  visualized.   5. The inferior vena cava is normal in size with greater than 50%  respiratory variability, suggesting right atrial pressure of 3 mmHg.   Comparison(s): No prior Echocardiogram.  Myoview 05/2021:    The study is normal. There are no perfusion defects consistent with prior infarct or current ischemia The study is low risk.   No ST deviation was noted.   Left ventricular function is normal. Nuclear stress EF: 68 %. The left ventricular ejection fraction is hyperdynamic (>65%). End diastolic cavity size is normal.  Recent Labs: 12/31/2022: ALT 10; BUN 8; Creatinine, Ser 0.66; Hemoglobin 15.0; Platelets 306; Potassium 4.2; Sodium 142; TSH 2.050  Recent Lipid Panel    Component Value Date/Time   CHOL 206 (H) 04/02/2021 1048   TRIG 255 (H) 04/02/2021 1048   HDL 38 (L) 04/02/2021 1048   CHOLHDL 5.4 (H) 04/02/2021 1048   LDLCALC 123 (H) 04/02/2021 1048    Risk Assessment/Calculations:    The 10-year ASCVD risk score (Arnett DK, et al., 2019) is: 6.2%   Values used to calculate the score:     Age: 72 years     Sex: Female     Is Non-Hispanic African American: No     Diabetic: No     Tobacco smoker: Yes     Systolic Blood Pressure: 142 mmHg     Is BP treated: No     HDL Cholesterol: 38 mg/dL     Total Cholesterol: 206 mg/dL  Home Medications   Current Meds  Medication Sig   albuterol (PROAIR HFA) 108 (90 Base) MCG/ACT inhaler Inhale 2 puffs into the lungs every 6 (six) hours as needed for wheezing or shortness of breath.   albuterol (PROVENTIL) (2.5 MG/3ML) 0.083% nebulizer solution Take 3 mLs (2.5 mg total) by nebulization every 4 (four) hours as needed for wheezing or shortness of breath.   aspirin-acetaminophen-caffeine (EXCEDRIN MIGRAINE) 250-250-65 MG  tablet Take by mouth every 6 (six) hours as needed for headache.   azelastine (ASTELIN) 0.1 % nasal spray Place 1 spray into both nostrils 2 (two) times daily as needed for rhinitis. Use in each nostril as directed   Blood Pressure Monitoring (B-D ASSURE BPM/AUTO ARM CUFF) MISC Use as directed   budesonide-formoterol (SYMBICORT) 160-4.5 MCG/ACT inhaler Inhale 2 puffs into the lungs 2 (two) times daily.   cetirizine (ZYRTEC) 10 MG tablet Take 10 mg by mouth as needed for allergies.   DULoxetine (CYMBALTA) 30 MG capsule Take 1 capsule (30 mg total) by mouth daily.   ondansetron (ZOFRAN) 4 MG tablet Take 1 tablet (4 mg total) by mouth every 8 (eight) hours as needed for nausea or vomiting.   RESTASIS 0.05 % ophthalmic emulsion 1 drop 2 (two) times daily.   Vitamin D, Ergocalciferol, (DRISDOL) 1.25 MG (50000 UNIT) CAPS capsule Take 1 capsule (50,000 Units total) by mouth every 7 (seven) days for 12 doses.     Review of  Systems    All other systems reviewed and are otherwise negative except as noted above.  Physical Exam    VS:  BP (!) 142/98   Pulse 80   Ht 5\' 3"  (1.6 m)   Wt 207 lb 12.8 oz (94.3 kg)   SpO2 97%   BMI 36.81 kg/m  , BMI Body mass index is 36.81 kg/m.  Wt Readings from Last 3 Encounters:  02/09/23 207 lb 12.8 oz (94.3 kg)  01/30/23 204 lb (92.5 kg)  12/31/22 204 lb (92.5 kg)    Repeat BP: 140/82   GEN: Obese, 43 y.o. female in no acute distress. HEENT: normal. Neck: Supple, no JVD, carotid bruits, or masses. Cardiac: S1/S2, RRR, no murmurs, rubs, or gallops. No clubbing, cyanosis, edema.  Radials/PT 2+ and equal bilaterally.  Respiratory:  Respirations regular and unlabored, clear to auscultation bilaterally. MS: No deformity or atrophy. Skin: Warm and dry, no rash. Neuro:  Strength and sensation are intact. Psych: Normal affect.  Assessment & Plan    Palpitations Ongoing palpitations, stable over time.  Etiology multifactorial.  Overall monitor benign and  does not explain her symptoms. We discussed avoiding stimulants, chocolate, nicotine, caffeine, and marijuana. Will focus on lifestyle changes at this time. Recent labs stable. Heart healthy diet and regular cardiovascular exercise encouraged.   Mixed hyperlipidemia No recent lipid panel on file and appears she is not on any lipid lowering medications at this time. At next OV, plan to discuss obtaining/requesting labs. Heart healthy diet and regular cardiovascular exercise encouraged. Continue to follow with PCP.  Elevated BP Blood pressure on arrival 142/98, repeat BP 140/82.  Discussed SBP goal is less than 130.  She does not have a blood pressure cuff, and will prescribe 1 for her to check at home.  Given BP log and discussed to monitor BP at home at least 2 hours after medications and sitting for 5-10 minutes.  Currently not on any antihypertensive medication.  If no improvement by next office visit, consider low-dose ACE/ARB/CCB. Heart healthy diet and regular cardiovascular exercise encouraged.   DOE, lightheadedness In the setting of her palpitations.  Past cardiac workup arranged by Dr. Diona Browner unremarkable.  Recent labs stable. No medication changes at this time.  Discussed conservative measures.  She verbalized understanding. Heart healthy diet and regular cardiovascular exercise encouraged.  ED precautions discussed.  If no improvement by next office visit, recommend obtaining orthostatics.  At risk for OSA, fatigue Has been unable to proceed/finish sleep study.  Recommended to refer her to sleep medicine clinic, she politely declines at this time. Continue to follow with PCP.  6.  Tobacco abuse, marijuana abuse Smoking cessation encouraged and discussed.  Recommended to avoid marijuana.  7. Obesity BMI 36.81. Weight loss via diet and exercise encouraged. Discussed the impact being overweight would have on cardiovascular risk.   Disposition: Follow up in 3 month(s) with Nona Dell, MD or APP.  Signed, Sharlene Dory, NP 02/09/2023, 4:24 PM [facility]

## 2023-02-09 NOTE — Patient Instructions (Addendum)
Medication Instructions:   Continue all current medications.   Labwork:  none  Testing/Procedures:  none  Follow-Up:  3 months   Any Other Special Instructions Will Be Listed Below (If Applicable).  Salty six BP log  BP cuff - prescription sent to pharmacy  If you need a refill on your cardiac medications before your next appointment, please call your pharmacy.

## 2023-02-10 ENCOUNTER — Ambulatory Visit: Payer: Medicaid Other | Attending: Cardiology | Admitting: *Deleted

## 2023-02-10 ENCOUNTER — Other Ambulatory Visit (INDEPENDENT_AMBULATORY_CARE_PROVIDER_SITE_OTHER): Payer: Medicaid Other | Admitting: *Deleted

## 2023-02-10 ENCOUNTER — Telehealth: Payer: Self-pay | Admitting: Nurse Practitioner

## 2023-02-10 ENCOUNTER — Telehealth: Payer: Self-pay

## 2023-02-10 VITALS — BP 158/100 | HR 72

## 2023-02-10 DIAGNOSIS — F419 Anxiety disorder, unspecified: Secondary | ICD-10-CM | POA: Diagnosis present

## 2023-02-10 DIAGNOSIS — R002 Palpitations: Secondary | ICD-10-CM | POA: Insufficient documentation

## 2023-02-10 MED ORDER — AMLODIPINE BESYLATE 2.5 MG PO TABS: 2.5000 mg | ORAL_TABLET | Freq: Every day | ORAL | 6 refills | Status: DC

## 2023-02-10 NOTE — Telephone Encounter (Signed)
Patient was able to meds from cardiology. She wlll schedule triage RN visit in a couple weeks

## 2023-02-10 NOTE — Progress Notes (Signed)
Patient walked into office this morning c/o elevated BP.  States that she did go to [facility] ED last evening but left AMA.  She was also just seen in office yesterday by Sharlene Dory, NP.    Patient was seen for nurse visit - EKG & vitals done.    Per further discussion with provider - begin Amlodipine 2.5mg  daily & refer to Psychiatry for anxiety

## 2023-02-10 NOTE — Telephone Encounter (Signed)
Patient had appointment with cardiology yesterday.  She was given a prescription for a blood pressure monitor and told to monitor at home and report readings to determine if she  needed to be on medication.  Patient called here this morning to report that her blood pressure this morning has been 171/97 and 165/96 and was asking for medication to be prescribed.  She said she had called the cardiology office in [place] where she went and they are closed.  After reading the notes from the cardiologist, I recommended to the patient that she call their office in [place] to report her readings and see what they recommended.  She was given their number to call and voices understanding.

## 2023-02-10 NOTE — Patient Instructions (Signed)
Medication Instructions:   Begin Amlodipine 2.5mg  daily  Continue all other medications.     Labwork:  none  Testing/Procedures:  none  Follow-Up:  As planned  Any Other Special Instructions Will Be Listed Below (If Applicable).  You have been referred to Psychiatry    If you need a refill on your cardiac medications before your next appointment, please call your pharmacy.

## 2023-02-10 NOTE — Telephone Encounter (Signed)
See nurse visit notes

## 2023-02-10 NOTE — Telephone Encounter (Signed)
She is concerned about high blood pressure since leaving yesterday. She said she has called call center with no answer. 8:14 Right Arm 171/97 HR 103   Left Arm 8:15 165/96 HR 97. Having some CP and, nauseous and headache yesterday... please advise she is out in the lobby.

## 2023-02-11 ENCOUNTER — Ambulatory Visit (INDEPENDENT_AMBULATORY_CARE_PROVIDER_SITE_OTHER): Payer: Medicaid Other | Admitting: Family Medicine

## 2023-02-11 ENCOUNTER — Encounter: Payer: Self-pay | Admitting: Family Medicine

## 2023-02-11 VITALS — BP 109/72 | HR 83 | Temp 97.2°F | Ht 63.0 in | Wt 201.0 lb

## 2023-02-11 DIAGNOSIS — R002 Palpitations: Secondary | ICD-10-CM

## 2023-02-11 DIAGNOSIS — I10 Essential (primary) hypertension: Secondary | ICD-10-CM

## 2023-02-11 DIAGNOSIS — F339 Major depressive disorder, recurrent, unspecified: Secondary | ICD-10-CM

## 2023-02-11 DIAGNOSIS — F411 Generalized anxiety disorder: Secondary | ICD-10-CM

## 2023-02-11 MED ORDER — DULOXETINE HCL 60 MG PO CPEP
60.0000 mg | ORAL_CAPSULE | Freq: Every day | ORAL | 0 refills | Status: DC
Start: 2023-02-11 — End: 2023-03-10

## 2023-02-11 MED ORDER — HYDROXYZINE PAMOATE 25 MG PO CAPS
25.0000 mg | ORAL_CAPSULE | Freq: Three times a day (TID) | ORAL | 0 refills | Status: DC | PRN
Start: 2023-02-11 — End: 2023-03-10

## 2023-02-11 NOTE — Progress Notes (Signed)
Acute Office Visit  Subjective:  Patient ID: Alice Santiago, female    DOB: 08-25-1981, 42 y.o.   MRN: [id number]  Chief Complaint  Patient presents with   Anxiety   Hypertension   Patient is in today for anxiety and BP  Anxiety States that she been taking cymbalta every night. She feels that her heart is having palpitations. She did not sleep last night. She tried to clean, listen to music. She is stressed about her son's graduation this weekend. Has not been taking caffeine or nicotine. Reports that her anxiety is up because her BP is elevated and it is scaring her. She states that she has stuff to do, that she wants to do and would like to get herself controlled to do those things.   Depression/Anxiety States that Cardiology referred her to psych, but that she has not heard form them. She reports that she is "terrified" of going to the hospital. She does not want to be admitted. Denies SI, Hallucinations, Auditory and visual. She states that the cymbalta was helping and that she was waking up feeling okay. States that she is only stressed because of everything going on with her BP and her son. States that she isn't drinking water or eating because she is so stressed out.   ROS As per HPI Objective:  BP 133/87   Pulse (!) 103   Temp (!) 97.2 F (36.2 C) (Temporal)   Ht 5\' 3"  (1.6 m)   Wt 201 lb (91.2 kg)   BMI 35.61 kg/m     02/11/2023    2:18 PM 02/11/2023    1:42 PM 02/10/2023    9:37 AM  Vitals with BMI  Height  5\' 3"    Weight  201 lbs   BMI  35.61   Systolic 109 133 409  Diastolic 72 87 100  Pulse 83 103 72    Physical Exam Constitutional:      General: She is awake. She is not in acute distress.    Appearance: Normal appearance. She is well-developed and well-groomed. She is not ill-appearing, toxic-appearing or diaphoretic.  Cardiovascular:     Rate and Rhythm: Normal rate.     Pulses: Normal pulses.          Radial pulses are 2+ on the right side and 2+ on the  left side.       Posterior tibial pulses are 2+ on the right side and 2+ on the left side.     Heart sounds: Normal heart sounds. No murmur heard.    No gallop.  Pulmonary:     Effort: Pulmonary effort is normal. No respiratory distress.     Breath sounds: Normal breath sounds. No stridor. No wheezing, rhonchi or rales.  Musculoskeletal:     Cervical back: Full passive range of motion without pain and neck supple.     Right lower leg: No edema.     Left lower leg: No edema.  Skin:    General: Skin is warm.     Capillary Refill: Capillary refill takes less than 2 seconds.  Neurological:     General: No focal deficit present.     Mental Status: She is alert, oriented to person, place, and time and easily aroused. Mental status is at baseline.     GCS: GCS eye subscore is 4. GCS verbal subscore is 5. GCS motor subscore is 6.     Motor: No weakness.  Psychiatric:  Attention and Perception: Attention and perception normal.        Mood and Affect: Mood is anxious. Affect is tearful.        Speech: Speech normal.        Behavior: Behavior normal. Behavior is cooperative.        Thought Content: Thought content normal. Thought content does not include homicidal or suicidal ideation. Thought content does not include homicidal or suicidal plan.        Cognition and Memory: Cognition and memory normal.        Judgment: Judgment normal.    Assessment & Plan:  1. Depression, recurrent (HCC) Referral placed to psych as below. Will increase dose of cymbalta to 60 mg.  - DULoxetine (CYMBALTA) 60 MG capsule; Take 1 capsule (60 mg total) by mouth daily.  Dispense: 60 capsule; Refill: 0 - Ambulatory referral to Psychiatry  2. GAD (generalized anxiety disorder) Increase in cymbalta as below. Referral as below. Will add on vistaril. Discussed with patient that she can take 3 per day as needed for anxiety. If she does not take 3 per day she may take 2 at night for sleep. Written and verbal  instructions provided to patient.  - DULoxetine (CYMBALTA) 60 MG capsule; Take 1 capsule (60 mg total) by mouth daily.  Dispense: 60 capsule; Refill: 0 - hydrOXYzine (VISTARIL) 25 MG capsule; Take 1 capsule (25 mg total) by mouth every 8 (eight) hours as needed for up to 60 doses.  Dispense: 30 capsule; Refill: 0 - Ambulatory referral to Psychiatry  3. Palpitations EKG reviewed from yesterday. NSR with SA in Cardiology office on 02/10/23, reviewed by Sharlene Dory, NP.   4. Primary hypertension Patient home BP monitor not calibrated. Patient called [employer] while in office and is to go pick up another monitor today. Discussed with patient and provided written education about proper use of medications. Provided information about hypotension as patient is concerned that medication will drop her BP too low. Encouraged patient to take her medications as prescribed and to continue to monitor her BP at home.   The above assessment and management plan was discussed with the patient. The patient verbalized understanding of and has agreed to the management plan using shared-decision making. Patient is aware to call the clinic if they develop any new symptoms or if symptoms fail to improve or worsen. Patient is aware when to return to the clinic for a follow-up visit. Patient educated on when it is appropriate to go to the emergency department.   Return in about 4 weeks (around 03/11/2023) for Chronic Condition Follow up.  Neale Burly, DNP-FNP [facility] [address] [place], [place] [address] [phone]

## 2023-02-11 NOTE — Patient Instructions (Addendum)
Take one 25 mg Hydroxyzine during the day for anxiety. Can take two at night for sleep if needed. Take 3 times per day as needed for anxiety.   Start amlodipine in AM   Monitoring your BP at home   Your BP was elevated today in office  Please keep a log of your BP at home.  The best time to take BP is 1st thing in the morning after waking.  Sit for 5 minutes with feet flat on the floor, arm at heart level.  Options for BP cuffs are at [employer], [employer], [employer], [facility] & [facility].  We will review measurements at follow up and determine plan for BP management.  If you have access, you can send a message in MyChart with your measurements prior to your follow up appointment.  The brand I recommend to get is Omron (Bronze).

## 2023-03-03 ENCOUNTER — Ambulatory Visit: Payer: Medicaid Other | Admitting: Family Medicine

## 2023-03-04 ENCOUNTER — Encounter: Payer: Self-pay | Admitting: Family Medicine

## 2023-03-10 ENCOUNTER — Ambulatory Visit (INDEPENDENT_AMBULATORY_CARE_PROVIDER_SITE_OTHER): Payer: MEDICAID | Admitting: Family Medicine

## 2023-03-10 ENCOUNTER — Encounter: Payer: Self-pay | Admitting: Family Medicine

## 2023-03-10 VITALS — BP 111/74 | HR 87 | Temp 98.5°F | Ht 63.0 in | Wt 205.0 lb

## 2023-03-10 DIAGNOSIS — E559 Vitamin D deficiency, unspecified: Secondary | ICD-10-CM | POA: Diagnosis not present

## 2023-03-10 DIAGNOSIS — R3589 Other polyuria: Secondary | ICD-10-CM

## 2023-03-10 DIAGNOSIS — R300 Dysuria: Secondary | ICD-10-CM | POA: Diagnosis not present

## 2023-03-10 DIAGNOSIS — F411 Generalized anxiety disorder: Secondary | ICD-10-CM

## 2023-03-10 DIAGNOSIS — B9689 Other specified bacterial agents as the cause of diseases classified elsewhere: Secondary | ICD-10-CM

## 2023-03-10 DIAGNOSIS — Z113 Encounter for screening for infections with a predominantly sexual mode of transmission: Secondary | ICD-10-CM

## 2023-03-10 DIAGNOSIS — N760 Acute vaginitis: Secondary | ICD-10-CM

## 2023-03-10 DIAGNOSIS — F39 Unspecified mood [affective] disorder: Secondary | ICD-10-CM

## 2023-03-10 LAB — URINALYSIS, ROUTINE W REFLEX MICROSCOPIC
Bilirubin, UA: NEGATIVE
Glucose, UA: NEGATIVE
Ketones, UA: NEGATIVE
Leukocytes,UA: NEGATIVE
Nitrite, UA: NEGATIVE
Protein,UA: NEGATIVE
RBC, UA: NEGATIVE
Specific Gravity, UA: 1.02 (ref 1.005–1.030)
Urobilinogen, Ur: 0.2 mg/dL (ref 0.2–1.0)
pH, UA: 7 (ref 5.0–7.5)

## 2023-03-10 MED ORDER — HYDROXYZINE PAMOATE 25 MG PO CAPS
25.0000 mg | ORAL_CAPSULE | Freq: Three times a day (TID) | ORAL | 0 refills | Status: DC | PRN
Start: 2023-03-10 — End: 2023-07-02

## 2023-03-10 MED ORDER — DULOXETINE HCL 30 MG PO CPEP
30.0000 mg | ORAL_CAPSULE | Freq: Every day | ORAL | 3 refills | Status: DC
Start: 2023-03-10 — End: 2023-07-02

## 2023-03-10 NOTE — Progress Notes (Signed)
Acute Office Visit  Subjective:  Patient ID: Alice Santiago, female    DOB: 1981/04/25, 42 y.o.   MRN: [id number]  Chief Complaint  Patient presents with   Medical Management of Chronic Issues    Hydoxizine f/u dysuria   HPI Anxiety/Depression Patient is in today for hydroxyzine. States that she is taking it at night during to go to sleep. She states that she also has taken in during the day sometimes and it is helping some but she felt she did not have enough to take one in the day and one at night to go to sleep. She recently increased to 60mg  of Cymbalta. Reports that cymbalta at 60 is "too much". Reports that it increased her lack of motivation. Would like to stay on the 30 mg. She is very stressed about a diagnosis that her father has received. She got a call from psych and has follow up scheduled    Urinary Symptoms  Reports increased urination and "irritation". Denies burning, discharge, fever. Would like STI testing.   Vitamin D deficiency    States that she has one week left of medication. Denies any symptoms. Patient would like to monitor lab.   ROS As per HPI   Objective:  BP 111/74   Pulse 87   Temp 98.5 F (36.9 C)   Ht 5\' 3"  (1.6 m)   Wt 205 lb (93 kg)   SpO2 98%   BMI 36.31 kg/m   Physical Exam Constitutional:      General: She is awake. She is not in acute distress.    Appearance: Normal appearance. She is well-developed and well-groomed. She is not ill-appearing, toxic-appearing or diaphoretic.  Cardiovascular:     Rate and Rhythm: Normal rate.     Pulses: Normal pulses.          Radial pulses are 2+ on the right side and 2+ on the left side.       Posterior tibial pulses are 2+ on the right side and 2+ on the left side.     Heart sounds: Normal heart sounds. No murmur heard.    No gallop.  Pulmonary:     Effort: Pulmonary effort is normal. No respiratory distress.     Breath sounds: Normal breath sounds. No stridor. No wheezing, rhonchi or rales.   Musculoskeletal:     Cervical back: Full passive range of motion without pain and neck supple.     Right lower leg: No edema.     Left lower leg: No edema.  Skin:    General: Skin is warm.     Capillary Refill: Capillary refill takes less than 2 seconds.  Neurological:     General: No focal deficit present.     Mental Status: She is alert, oriented to person, place, and time and easily aroused. Mental status is at baseline.     GCS: GCS eye subscore is 4. GCS verbal subscore is 5. GCS motor subscore is 6.     Motor: No weakness.  Psychiatric:        Attention and Perception: Attention and perception normal.        Mood and Affect: Mood is anxious. Affect is tearful.        Speech: Speech normal.        Behavior: Behavior normal. Behavior is cooperative.        Thought Content: Thought content normal. Thought content does not include homicidal or suicidal ideation. Thought content does not include homicidal  or suicidal plan.        Cognition and Memory: Cognition and memory normal.        Judgment: Judgment normal.     Comments: Difficult to redirect conversation        01/30/2023   11:45 AM 12/31/2022    3:27 PM 10/01/2021    3:25 PM  Depression screen PHQ 2/9  Decreased Interest 2 1 0  Down, Depressed, Hopeless 2 1 0  PHQ - 2 Score 4 2 0  Altered sleeping 2 1   Tired, decreased energy 2 1   Change in appetite 2 1   Feeling bad or failure about yourself  0 0   Trouble concentrating 2 0   Moving slowly or fidgety/restless 0 1   Suicidal thoughts 0    PHQ-9 Score 12 6       01/30/2023   11:45 AM 12/31/2022    3:28 PM 04/02/2021   12:34 PM 08/23/2019    4:16 PM  GAD 7 : Generalized Anxiety Score  Nervous, Anxious, on Edge 0 0 3 2  Control/stop worrying 0 0 0 3  Worry too much - different things 2 1 0 3  Trouble relaxing 0 0 0 2  Restless  0 0 2  Easily annoyed or irritable 1 0 1 3  Afraid - awful might happen 0 0 0 0  Total GAD 7 Score  1 4 15   Anxiety Difficulty    Very difficult Somewhat difficult   Assessment & Plan:  1. Polyuria Labs as below. Will communicate results to patient once available.  Reviewed UA in office. Will wait for culture results to determine next steps.  - Urinalysis, Routine w reflex microscopic - Urine Culture - Bayer DCA Hb A1c Waived  2. Dysuria Labs as below. Will communicate results to patient once available.  Reviewed UA in office. Will wait for culture results to determine next steps.  - Urinalysis, Routine w reflex microscopic - Urine Culture - WET PREP FOR TRICH, YEAST, CLUE  3. Mood disorder (HCC) Medications as below. Provided patient with adequate supply of hydroxyzine. Decreased cymbalta to 30 mg. Instructed patient to follow up with psych. Denies SI.  Safety contract established.   4. GAD (generalized anxiety disorder) Medications as below. Provided patient with adequate supply of hydroxyzine. Decreased cymbalta to 30 mg. Instructed patient to follow up with psych. Denies SI. Safety contract established.  - DULoxetine (CYMBALTA) 30 MG capsule; Take 1 capsule (30 mg total) by mouth daily.  Dispense: 30 capsule; Refill: 3 - hydrOXYzine (VISTARIL) 25 MG capsule; Take 1 capsule (25 mg total) by mouth every 8 (eight) hours as needed.  Dispense: 60 capsule; Refill: 0  5. Vitamin D deficiency Will recheck lab as below. Continue current regimen.  - VITAMIN D 25 Hydroxy (Vit-D Deficiency, Fractures)  6. Routine screening for STI (sexually transmitted infection) Labs as below. Will communicate results to patient once available.  - Ct Ng M genitalium NAA, Urine - RPR - HepB+HepC+HIV Panel  The above assessment and management plan was discussed with the patient. The patient verbalized understanding of and has agreed to the management plan using shared-decision making. Patient is aware to call the clinic if they develop any new symptoms or if symptoms fail to improve or worsen. Patient is aware when to return to the  clinic for a follow-up visit. Patient educated on when it is appropriate to go to the emergency department.   Return in about 3 months (around 06/10/2023)  for Chronic Condition Follow up.  Neale Burly, DNP-FNP [facility] [address] [place], [place] [address] [phone]

## 2023-03-11 ENCOUNTER — Telehealth: Payer: Self-pay | Admitting: Family Medicine

## 2023-03-11 LAB — URINE CULTURE

## 2023-03-11 LAB — HEPB+HEPC+HIV PANEL
HIV Screen 4th Generation wRfx: NONREACTIVE
Hep B C IgM: NEGATIVE
Hep B Core Total Ab: NEGATIVE
Hep B E Ab: NONREACTIVE
Hep B E Ag: NEGATIVE
Hep B Surface Ab, Qual: NONREACTIVE
Hep C Virus Ab: NONREACTIVE
Hepatitis B Surface Ag: NEGATIVE

## 2023-03-11 LAB — VITAMIN D 25 HYDROXY (VIT D DEFICIENCY, FRACTURES): Vit D, 25-Hydroxy: 28.7 ng/mL — ABNORMAL LOW (ref 30.0–100.0)

## 2023-03-11 LAB — BAYER DCA HB A1C WAIVED: HB A1C (BAYER DCA - WAIVED): 6.2 % — ABNORMAL HIGH (ref 4.8–5.6)

## 2023-03-11 LAB — CT NG M GENITALIUM NAA, URINE
Chlamydia trachomatis, NAA: NEGATIVE
Mycoplasma genitalium NAA: NEGATIVE
Neisseria gonorrhoeae, NAA: NEGATIVE

## 2023-03-11 LAB — RPR: RPR Ser Ql: NONREACTIVE

## 2023-03-11 LAB — WET PREP FOR TRICH, YEAST, CLUE
Clue Cell Exam: POSITIVE — AB
Trichomonas Exam: NEGATIVE
Yeast Exam: NEGATIVE

## 2023-03-11 MED ORDER — METRONIDAZOLE 500 MG PO TABS
500.0000 mg | ORAL_TABLET | Freq: Three times a day (TID) | ORAL | 0 refills | Status: AC
Start: 2023-03-11 — End: 2023-03-18

## 2023-03-11 NOTE — Telephone Encounter (Signed)
I spoke to the patient. She is awarre

## 2023-03-11 NOTE — Progress Notes (Signed)
Positive for bacterial vaginosis. Metronidazole 500 mg for 7 days sent to [facility]. Vitamin D remains low. Recommend she start daily 1000-2000 international units after completing her weekly doses. Negative for syphilis, hep C, hep B, and HIV. Does not have immunity to Hep B. Can schedule appt with triage RN if she would like a booster. A1C is 6.2%, which is in the prediabetes range. Discussed with patient to continuehealthy lifestyle choices, including diet (rich in fruits, vegetables, and lean proteins, and low in salt and simple carbohydrates) and exercise (at least 30 minutes of moderate physical activity daily). Limit beverages high is sugar. Recommended at least 80-100 oz of water daily.

## 2023-03-11 NOTE — Progress Notes (Signed)
Previously discussed A1C result.

## 2023-03-11 NOTE — Addendum Note (Signed)
Addended by: Neale Burly on: 03/11/2023 12:30 PM   Modules accepted: Orders

## 2023-03-13 ENCOUNTER — Ambulatory Visit (INDEPENDENT_AMBULATORY_CARE_PROVIDER_SITE_OTHER): Payer: Medicaid Other

## 2023-03-13 DIAGNOSIS — Z23 Encounter for immunization: Secondary | ICD-10-CM

## 2023-03-13 NOTE — Progress Notes (Signed)
Negative STI and UTI. Continue current treatment for BV. Follow up if symptoms continue.

## 2023-03-16 ENCOUNTER — Telehealth: Payer: Self-pay | Admitting: Family Medicine

## 2023-03-16 NOTE — Telephone Encounter (Signed)
Pt called stating that she wants her referral sent to [facility] in [place] and wants to see Dr Isaac Bliss.

## 2023-03-19 NOTE — Telephone Encounter (Signed)
Unable to reach patient. Sent Mychart message informing patient of referral

## 2023-03-20 ENCOUNTER — Ambulatory Visit: Payer: Medicaid Other | Admitting: Family Medicine

## 2023-03-24 ENCOUNTER — Ambulatory Visit (HOSPITAL_COMMUNITY): Payer: No Typology Code available for payment source | Admitting: Psychiatry

## 2023-04-01 ENCOUNTER — Ambulatory Visit: Payer: MEDICAID | Admitting: Family Medicine

## 2023-04-03 ENCOUNTER — Ambulatory Visit: Payer: MEDICAID | Admitting: Family Medicine

## 2023-04-03 NOTE — Progress Notes (Deleted)
   [address] [place] [address] Dept: [phone]  FOLLOW UP NOTE  Patient ID: Alice Santiago, female    DOB: [birth year]  Age: 42 y.o. MRN: [id number] Date of Office Visit: 04/03/2023  Assessment  Chief Complaint: No chief complaint on file.  HPI Alice Santiago is a 42 year old female who presents to the clinic for follow-up visit.  She was last seen in this clinic on 10/27/2022 by Dr. Allena Katz for evaluation of asthma, allergic rhinitis, and tobacco use.  Her last environmental allergy testing was on 01/11/2021 and was positive to grass pollen, ragweed pollen, weed pollen, indoor mold, outdoor mold, dust mite, cat, and feathers.   Drug Allergies:  No Known Allergies  Physical Exam: There were no vitals taken for this visit.   Physical Exam  Diagnostics:    Assessment and Plan: No diagnosis found.  No orders of the defined types were placed in this encounter.   There are no Patient Instructions on file for this visit.  No follow-ups on file.    Thank you for the opportunity to care for this patient.  Please do not hesitate to contact me with questions.  Thermon Leyland, FNP [facility]

## 2023-04-08 ENCOUNTER — Encounter: Payer: Self-pay | Admitting: Family Medicine

## 2023-04-08 ENCOUNTER — Other Ambulatory Visit: Payer: Self-pay

## 2023-04-08 ENCOUNTER — Ambulatory Visit (INDEPENDENT_AMBULATORY_CARE_PROVIDER_SITE_OTHER): Payer: MEDICAID | Admitting: Family Medicine

## 2023-04-08 VITALS — BP 126/78 | HR 114 | Temp 98.3°F | Ht 63.78 in | Wt 208.4 lb

## 2023-04-08 DIAGNOSIS — J4540 Moderate persistent asthma, uncomplicated: Secondary | ICD-10-CM

## 2023-04-08 DIAGNOSIS — J302 Other seasonal allergic rhinitis: Secondary | ICD-10-CM | POA: Diagnosis not present

## 2023-04-08 DIAGNOSIS — F17218 Nicotine dependence, cigarettes, with other nicotine-induced disorders: Secondary | ICD-10-CM | POA: Diagnosis not present

## 2023-04-08 DIAGNOSIS — J3089 Other allergic rhinitis: Secondary | ICD-10-CM | POA: Diagnosis not present

## 2023-04-08 DIAGNOSIS — J45998 Other asthma: Secondary | ICD-10-CM | POA: Diagnosis not present

## 2023-04-08 DIAGNOSIS — F17200 Nicotine dependence, unspecified, uncomplicated: Secondary | ICD-10-CM | POA: Insufficient documentation

## 2023-04-08 MED ORDER — LEVALBUTEROL TARTRATE 45 MCG/ACT IN AERO: 2.0000 | INHALATION_SPRAY | RESPIRATORY_TRACT | 5 refills | Status: DC | PRN

## 2023-04-08 NOTE — Progress Notes (Signed)
[address] [place] [address] Dept: [phone]  FOLLOW UP NOTE  Patient ID: Alice Santiago, female    DOB: Jan 08, 1981  Age: 42 y.o. MRN: [id number] Date of Office Visit: 04/08/2023  Assessment  Chief Complaint: Follow-up  HPI Alice Santiago is a 42 year old female who presents to the clinic for follow-up visit.  She was last seen in this clinic on 10/27/2022 by Dr. Allena Katz for evaluation of asthma, allergic rhinitis, and tobacco use.    At today's visit, she reports her asthma has been poorly controlled with shortness of breath with activity as the main symptom.  She denies shortness of breath at rest.  She denies wheezing and coughing with rest or activity.  She continues Symbicort 160 about 3 days a week and has not used her albuterol since her last visit to this clinic.  She does report occasional palpitations for which she follows up with [facility].  We discussed in detail the mechanism of action of Symbicort inhaler versus albuterol versus Xopenex and when it is appropriate to use these medications.  She reports allergic rhinitis has been moderately well-controlled with occasional nasal symptoms for which she continues cetirizine as needed and is not currently using azelastine, Flonase, or nasal saline rinses.  Her last environmental allergy testing was on 01/11/2021 and was positive to grass pollen, ragweed pollen, weed pollen, indoor mold, outdoor mold, dust mite, cat, and feathers.  She reports that she continues to smoke about 7 to 10 cigarettes a day.  Written information was provided regarding smoking cessation.  Her current medications are listed in the chart.  Drug Allergies:  No Known Allergies  Physical Exam: BP 126/78   Pulse (!) 114   Temp 98.3 F (36.8 C)   Ht 5' 3.78" (1.62 m)   Wt 208 lb 6.4 oz (94.5 kg)   SpO2 95%   BMI 36.02 kg/m    Physical Exam Vitals reviewed.  Constitutional:      Appearance: Normal appearance.   HENT:     Head: Normocephalic and atraumatic.     Right Ear: Tympanic membrane normal.     Left Ear: Tympanic membrane normal.     Nose:     Comments: Bilateral nares edematous and pale with thin clear nasal drainage noted.  Pharynx slightly erythematous with no exudate.  Ears normal.  Eyes normal.    Mouth/Throat:     Pharynx: Oropharynx is clear.  Eyes:     Conjunctiva/sclera: Conjunctivae normal.  Cardiovascular:     Rate and Rhythm: Normal rate and regular rhythm.     Heart sounds: Normal heart sounds. No murmur heard. Pulmonary:     Effort: Pulmonary effort is normal.     Breath sounds: Normal breath sounds.     Comments: Lungs clear to auscultation Musculoskeletal:        General: Normal range of motion.     Cervical back: Normal range of motion and neck supple.  Skin:    General: Skin is warm and dry.  Neurological:     Mental Status: She is alert and oriented to person, place, and time.  Psychiatric:        Mood and Affect: Mood normal.        Behavior: Behavior normal.        Thought Content: Thought content normal.        Judgment: Judgment normal.     Assessment and Plan: 1. Not well controlled moderate persistent  asthma   2. Cigarette nicotine dependence with other nicotine-induced disorder   3. Seasonal and perennial allergic rhinitis     Meds ordered this encounter  Medications   levalbuterol (XOPENEX HFA) 45 MCG/ACT inhaler    Sig: Inhale 2 puffs into the lungs every 4 (four) hours as needed for wheezing.    Dispense:  1 each    Refill:  5    Patient Instructions  Asthma Increase Symbicort 160 to 2 puffs twice a day with a spacer to prevent cough or wheeze Begin Xopenex 2 puffs once every 4 hours as needed for cough or wheeze You may use Xopenex 2 puffs 5015 minutes before activity to decrease cough or wheeze We will check spirometry at your next visit  Allergic rhinitis Continue allergen avoidance measures directed toward grass pollen, ragweed  pollen, weed pollen, indoor mold, outdoor mold, dust mite, cat, and feather as listed below Continue cetirizine 10 mg once a day as needed for runny nose or itch Continue azelastine 2 sprays in each nostril once or twice a day as needed for nasal symptoms Consider saline nasal rinses as needed for nasal symptoms. Use this before any medicated nasal sprays for best result Consider allergen immunotherapy if your symptoms are not well-controlled with the treatment plan as listed above  Tobacco use Try to cut down and best to quit.  Written smoking cessation provided from up-to-date  Call the clinic if this treatment plan is not working well for you.  Follow up in 3 months or sooner if needed.   Return in about 3 months (around 07/09/2023), or if symptoms worsen or fail to improve.    Thank you for the opportunity to care for this patient.  Please do not hesitate to contact me with questions.  Thermon Leyland, FNP [facility]

## 2023-04-08 NOTE — Patient Instructions (Addendum)
Asthma Increase Symbicort 160 to 2 puffs twice a day with a spacer to prevent cough or wheeze Begin Xopenex 2 puffs once every 4 hours as needed for cough or wheeze You may use Xopenex 2 puffs 5015 minutes before activity to decrease cough or wheeze We will check spirometry at your next visit  Allergic rhinitis Continue allergen avoidance measures directed toward grass pollen, ragweed pollen, weed pollen, indoor mold, outdoor mold, dust mite, cat, and feather as listed below Continue cetirizine 10 mg once a day as needed for runny nose or itch Continue azelastine 2 sprays in each nostril once or twice a day as needed for nasal symptoms Consider saline nasal rinses as needed for nasal symptoms. Use this before any medicated nasal sprays for best result Consider allergen immunotherapy if your symptoms are not well-controlled with the treatment plan as listed above  Tobacco use Try to cut down and best to quit.  Written smoking cessation provided from up-to-date  Call the clinic if this treatment plan is not working well for you.  Follow up in 3 months or sooner if needed.  Reducing Pollen Exposure The American Academy of Allergy, Asthma and Immunology suggests the following steps to reduce your exposure to pollen during allergy seasons. Do not hang sheets or clothing out to dry; pollen may collect on these items. Do not mow lawns or spend time around freshly cut grass; mowing stirs up pollen. Keep windows closed at night.  Keep car windows closed while driving. Minimize morning activities outdoors, a time when pollen counts are usually at their highest. Stay indoors as much as possible when pollen counts or humidity is high and on windy days when pollen tends to remain in the air longer. Use air conditioning when possible.  Many air conditioners have filters that trap the pollen spores. Use a HEPA room air filter to remove pollen form the indoor air you breathe.  Control of Mold  Allergen Mold and fungi can grow on a variety of surfaces provided certain temperature and moisture conditions exist.  Outdoor molds grow on plants, decaying vegetation and soil.  The major outdoor mold, Alternaria and Cladosporium, are found in very high numbers during hot and dry conditions.  Generally, a late Summer - Fall peak is seen for common outdoor fungal spores.  Rain will temporarily lower outdoor mold spore count, but counts rise rapidly when the rainy period ends.  The most important indoor molds are Aspergillus and Penicillium.  Dark, humid and poorly ventilated basements are ideal sites for mold growth.  The next most common sites of mold growth are the bathroom and the kitchen.  Outdoor [employer] Use air conditioning and keep windows closed Avoid exposure to decaying vegetation. Avoid leaf raking. Avoid grain handling. Consider wearing a face mask if working in moldy areas.  Indoor Mold Control Maintain humidity below 50%. Clean washable surfaces with 5% bleach solution. Remove sources e.g. Contaminated carpets.   Control of Dust Mite Allergen Dust mites play a major role in allergic asthma and rhinitis. They occur in environments with high humidity wherever human skin is found. Dust mites absorb humidity from the atmosphere (ie, they do not drink) and feed on organic matter (including shed human and animal skin). Dust mites are a microscopic type of insect that you cannot see with the naked eye. High levels of dust mites have been detected from mattresses, pillows, carpets, upholstered furniture, bed covers, clothes, soft toys and any woven material. The principal allergen of the  dust mite is found in its feces. A gram of dust may contain 1,000 mites and 250,000 fecal particles. Mite antigen is easily measured in the air during house cleaning activities. Dust mites do not bite and do not cause harm to humans, other than by triggering allergies/asthma.  Ways to decrease your  exposure to dust mites in your home:  1. Encase mattresses, box springs and pillows with a mite-impermeable barrier or cover  2. Wash sheets, blankets and drapes weekly in hot water (130 F) with detergent and dry them in a dryer on the hot setting.  3. Have the room cleaned frequently with a vacuum cleaner and a damp dust-mop. For carpeting or rugs, vacuuming with a vacuum cleaner equipped with a high-efficiency particulate air (HEPA) filter. The dust mite allergic individual should not be in a room which is being cleaned and should wait 1 hour after cleaning before going into the room.  4. Do not sleep on upholstered furniture (eg, couches).  5. If possible removing carpeting, upholstered furniture and drapery from the home is ideal. Horizontal blinds should be eliminated in the rooms where the person spends the most time (bedroom, study, television room). Washable vinyl, roller-type shades are optimal.  6. Remove all non-washable stuffed toys from the bedroom. Wash stuffed toys weekly like sheets and blankets above.  7. Reduce indoor humidity to less than 50%. Inexpensive humidity monitors can be purchased at most hardware stores. Do not use a humidifier as can make the problem worse and are not recommended.  Control of Dog or Cat Allergen Avoidance is the best way to manage a dog or cat allergy. If you have a dog or cat and are allergic to dog or cats, consider removing the dog or cat from the home. If you have a dog or cat but don't want to find it a new home, or if your family wants a pet even though someone in the household is allergic, here are some strategies that may help keep symptoms at bay:  Keep the pet out of your bedroom and restrict it to only a few rooms. Be advised that keeping the dog or cat in only one room will not limit the allergens to that room. Don't pet, hug or kiss the dog or cat; if you do, wash your hands with soap and water. High-efficiency particulate air (HEPA)  cleaners run continuously in a bedroom or living room can reduce allergen levels over time. Regular use of a high-efficiency vacuum cleaner or a central vacuum can reduce allergen levels. Giving your dog or cat a bath at least once a week can reduce airborne allergen.

## 2023-04-13 ENCOUNTER — Ambulatory Visit (INDEPENDENT_AMBULATORY_CARE_PROVIDER_SITE_OTHER): Payer: Medicaid Other

## 2023-04-13 ENCOUNTER — Telehealth: Payer: Self-pay | Admitting: Family Medicine

## 2023-04-13 DIAGNOSIS — Z0279 Encounter for issue of other medical certificate: Secondary | ICD-10-CM

## 2023-04-13 DIAGNOSIS — Z23 Encounter for immunization: Secondary | ICD-10-CM | POA: Diagnosis not present

## 2023-04-13 NOTE — Telephone Encounter (Signed)
Handicap form fee has been turned in, paid for, and put in [employer] box. Pt wants it mailed to her once completed.

## 2023-04-13 NOTE — Telephone Encounter (Signed)
Information completed and forwarded to PCP 

## 2023-04-15 NOTE — Telephone Encounter (Signed)
[facility] handicap form is ready

## 2023-04-21 ENCOUNTER — Other Ambulatory Visit: Payer: Self-pay | Admitting: Family Medicine

## 2023-04-21 DIAGNOSIS — Z0000 Encounter for general adult medical examination without abnormal findings: Secondary | ICD-10-CM

## 2023-04-23 DIAGNOSIS — F3162 Bipolar disorder, current episode mixed, moderate: Secondary | ICD-10-CM | POA: Diagnosis not present

## 2023-04-23 DIAGNOSIS — Z79899 Other long term (current) drug therapy: Secondary | ICD-10-CM | POA: Diagnosis not present

## 2023-04-23 DIAGNOSIS — F4312 Post-traumatic stress disorder, chronic: Secondary | ICD-10-CM | POA: Diagnosis not present

## 2023-04-23 DIAGNOSIS — F411 Generalized anxiety disorder: Secondary | ICD-10-CM | POA: Diagnosis not present

## 2023-04-24 NOTE — Telephone Encounter (Signed)
Pt called to check on status of handicap form because she says when she dropped it off, she requested that it be mailed to her once completed.   Found the handicap form in the drawer and put it in the mail pick up box today.

## 2023-05-06 ENCOUNTER — Telehealth: Payer: Self-pay | Admitting: Allergy & Immunology

## 2023-05-06 NOTE — Telephone Encounter (Signed)
Patient called stating she got a bill in the mail for 200 dollars and was wondering where it came from. Patient is asking if insurance would not cover. Please advise.

## 2023-05-14 ENCOUNTER — Ambulatory Visit: Payer: Medicaid Other | Admitting: Nurse Practitioner

## 2023-05-21 DIAGNOSIS — F4312 Post-traumatic stress disorder, chronic: Secondary | ICD-10-CM | POA: Diagnosis not present

## 2023-05-21 DIAGNOSIS — F3162 Bipolar disorder, current episode mixed, moderate: Secondary | ICD-10-CM | POA: Diagnosis not present

## 2023-05-21 DIAGNOSIS — F411 Generalized anxiety disorder: Secondary | ICD-10-CM | POA: Diagnosis not present

## 2023-05-25 DIAGNOSIS — H5213 Myopia, bilateral: Secondary | ICD-10-CM | POA: Diagnosis not present

## 2023-06-10 ENCOUNTER — Ambulatory Visit: Payer: MEDICAID | Admitting: Family Medicine

## 2023-06-15 ENCOUNTER — Ambulatory Visit
Admission: RE | Admit: 2023-06-15 | Discharge: 2023-06-15 | Disposition: A | Discharge status: Home / Self Care | Payer: Medicaid Other | Source: Ambulatory Visit | Attending: Family Medicine | Admitting: Family Medicine

## 2023-06-15 DIAGNOSIS — Z1231 Encounter for screening mammogram for malignant neoplasm of breast: Secondary | ICD-10-CM | POA: Diagnosis not present

## 2023-06-15 DIAGNOSIS — Z0000 Encounter for general adult medical examination without abnormal findings: Secondary | ICD-10-CM

## 2023-06-17 NOTE — Progress Notes (Signed)
Normal mammogram. Repeat screening in one year.

## 2023-06-18 DIAGNOSIS — F411 Generalized anxiety disorder: Secondary | ICD-10-CM | POA: Diagnosis not present

## 2023-06-18 DIAGNOSIS — F3162 Bipolar disorder, current episode mixed, moderate: Secondary | ICD-10-CM | POA: Diagnosis not present

## 2023-06-18 DIAGNOSIS — F4312 Post-traumatic stress disorder, chronic: Secondary | ICD-10-CM | POA: Diagnosis not present

## 2023-06-22 ENCOUNTER — Ambulatory Visit (INDEPENDENT_AMBULATORY_CARE_PROVIDER_SITE_OTHER): Payer: Medicaid Other

## 2023-06-22 DIAGNOSIS — Z23 Encounter for immunization: Secondary | ICD-10-CM | POA: Diagnosis not present

## 2023-06-24 ENCOUNTER — Telehealth: Payer: Self-pay | Admitting: Family Medicine

## 2023-06-24 NOTE — Telephone Encounter (Signed)
Patient had Hepatitis B vaccine yesterday.  Area of arm where injection was given is red and has a small knot, patient is wondering if this is normal.  Informed patient that this can happen after any vaccine, area is not warm to touch, redness is concentrated in the one area where shot was given, about 3/4 inch diameter.  Patient states she has used warm compress and taken Tylenol since she called and area does look better.  Advised her to continue to monitor and let us know if there are any changes.

## 2023-07-02 ENCOUNTER — Encounter: Payer: Self-pay | Admitting: Nurse Practitioner

## 2023-07-02 ENCOUNTER — Telehealth: Payer: Self-pay | Admitting: Cardiology

## 2023-07-02 ENCOUNTER — Ambulatory Visit: Payer: Medicaid Other | Attending: Nurse Practitioner | Admitting: Nurse Practitioner

## 2023-07-02 ENCOUNTER — Telehealth: Payer: Self-pay | Admitting: Nurse Practitioner

## 2023-07-02 VITALS — Ht 63.0 in | Wt 204.8 lb

## 2023-07-02 DIAGNOSIS — E782 Mixed hyperlipidemia: Secondary | ICD-10-CM

## 2023-07-02 DIAGNOSIS — R030 Elevated blood-pressure reading, without diagnosis of hypertension: Secondary | ICD-10-CM

## 2023-07-02 NOTE — Patient Instructions (Addendum)
Medication Instructions:  Your physician recommends that you continue on your current medications as directed. Please refer to the Current Medication list given to you today.  Labwork: In 6 weeks at [facility] Orders have been mailed to patient   Testing/Procedures: None   Follow-Up: Your physician recommends that you schedule a follow-up appointment in:  6 Months  6 week BP check(bring monitor to calibrate)   Any Other Special Instructions Will Be Listed Below (If Applicable).  If you need a refill on your cardiac medications before your next appointment, please call your pharmacy.

## 2023-07-02 NOTE — Progress Notes (Unsigned)
Virtual Visit via Telephone Note   Because of Alice Santiago's co-morbid illnesses, she is at least at moderate risk for complications without adequate follow up.  This format is felt to be most appropriate for this patient at this time.  The patient did not have access to video technology/had technical difficulties with video requiring transitioning to audio format only (telephone).  All issues noted in this document were discussed and addressed.  No physical exam could be performed with this format.  Please refer to the patient's chart for her consent to telehealth for [facility]    Date:  07/02/2023 ID:  Alice Santiago, DOB Feb 24, 1981, MRN [id number] The patient was identified using 2 identifiers. Patient Location: Home Provider Location: Office/Clinic  PCP:  Arrie Senate, FNP   [facility] Providers Cardiologist:  Nona Dell, MD { Click to update primary MD,subspecialty MD or APP then REFRESH:1}    Evaluation Performed:  Follow-Up Visit  Chief Complaint:  Elevated BP follow-up  History of Present Illness:    Alice Santiago is a 42 y.o. female with a PMH of chest pain, mixed hyperlipidemia, DOE, history of fatigue, longstanding tobacco abuse, and at risk for OSA, anxiety, depression, who presents today for elevated BP follow-up.   Last seen by Dr. Diona Browner on May 17, 2021.  Has a significant family history of cardiovascular disease.  Her father died at 70 years old with heart attack, he reportedly had heart murmur as well.  At that time, she reported chronic shortness of breath with activity x 1 year, worse with inclines, also noted feeling lightheaded with extreme activities.  At the time, she was planning on undergoing home sleep study evaluation for OSA.  Myoview was arranged and was normal.  Echocardiogram unremarkable.   02/09/2023 - Today she presents for arrhythmia evaluation.  She states she has occasional palpitations. Especially  notices palpitations with doing ADL's, feels lightheaded and winded with this. Does not wear CPAP for OSA, says she could not complete sleep study in the past. Has been weaning off caffeine use. Denies any chest pain, worsening shortness of breath, syncope, presyncope, orthopnea, PND, swelling or significant weight changes, acute bleeding, or claudication. Denies any alcohol use. Smokes around 1/2 PPD, smokes marijuana occasionally. Does not check BP at home.   ED visit that day elevated blood pressure, left AMA. She called the next day reporting SBP 160's - 170's. Was started on Amlodipine 2.5 mg daily and referred to Psychiatry for anxiety.   07/02/2023 - Patient is here for telephone visit and unable to come to office due to transportation issues. Truck has broken down and needs to be fixed per her report.  Says her palpitations have gotten better since last office visit after she quit drinking caffeine and they have seemed to settle down.  Could not tolerate amlodipine, medication made her sick and feels better off medication.  She expresses frustration at checking BP at home, due to what she believes are inaccurate readings because at the office visits her blood pressure readings are WNL, says checking BPs at home has also caused her anxiety. Denies any chest pain, shortness of breath, palpitations, syncope, presyncope, dizziness, orthopnea, PND, swelling or significant weight changes, acute bleeding, or claudication.  ROS:   Please see the history of present illness.     All other systems reviewed and are negative.   Prior CV studies:   The following studies were reviewed today:  ***  Labs/Other Tests and  Data Reviewed:    EKG:  {EKG/Telemetry Strips Reviewed:[phone]}  Recent Labs: 12/31/2022: ALT 10; BUN 8; Creatinine, Ser 0.66; Hemoglobin 15.0; Platelets 306; Potassium 4.2; Sodium 142; TSH 2.050   Recent Lipid Panel Lab Results  Component Value Date/Time   CHOL 206 (H) 04/02/2021  10:48 AM   TRIG 255 (H) 04/02/2021 10:48 AM   HDL 38 (L) 04/02/2021 10:48 AM   CHOLHDL 5.4 (H) 04/02/2021 10:48 AM   LDLCALC 123 (H) 04/02/2021 10:48 AM    Wt Readings from Last 3 Encounters:  07/02/23 204 lb 12.8 oz (92.9 kg)  04/08/23 208 lb 6.4 oz (94.5 kg)  03/10/23 205 lb (93 kg)     Risk Assessment/Calculations:   {Does this patient have ATRIAL FIBRILLATION?:[phone]}      Objective:    Vital Signs:  Ht 5\' 3"  (1.6 m)   Wt 204 lb 12.8 oz (92.9 kg)   BMI 36.28 kg/m    {HeartCare Virtual Exam (Optional):[phone]::"VITAL SIGNS:  reviewed"}  ASSESSMENT & PLAN:    ***      {Are you ordering a CV Procedure (e.g. stress test, cath, DCCV, TEE, etc)?   Press F2        :[id number]}     Time:   Today, I have spent 22 minutes with the patient with telehealth technology discussing the above problems.     Medication Adjustments/Labs and Tests Ordered: Current medicines are reviewed at length with the patient today.  Concerns regarding medicines are outlined above.   Tests Ordered: No orders of the defined types were placed in this encounter.   Medication Changes: No orders of the defined types were placed in this encounter.   Follow Up:  {F/U Format:[phone]} {follow up:15908}  Signed, Sharlene Dory, NP  07/02/2023 1:04 PM    [facility]

## 2023-07-02 NOTE — Telephone Encounter (Signed)
  Patient Consent for Virtual Visit        Alice Santiago has provided verbal consent on 07/02/2023 for a virtual visit (video or telephone).   CONSENT FOR VIRTUAL VISIT FOR:  Alice Santiago  By participating in this virtual visit I agree to the following:  I hereby voluntarily request, consent and authorize [facility] and its employed or contracted physicians, physician assistants, nurse practitioners or other licensed health care professionals (the Practitioner), to provide me with telemedicine health care services (the "Services") as deemed necessary by the treating Practitioner. I acknowledge and consent to receive the Services by the Practitioner via telemedicine. I understand that the telemedicine visit will involve communicating with the Practitioner through live audiovisual communication technology and the disclosure of certain medical information by electronic transmission. I acknowledge that I have been given the opportunity to request an in-person assessment or other available alternative prior to the telemedicine visit and am voluntarily participating in the telemedicine visit.  I understand that I have the right to withhold or withdraw my consent to the use of telemedicine in the course of my care at any time, without affecting my right to future care or treatment, and that the Practitioner or I may terminate the telemedicine visit at any time. I understand that I have the right to inspect all information obtained and/or recorded in the course of the telemedicine visit and may receive copies of available information for a reasonable fee.  I understand that some of the potential risks of receiving the Services via telemedicine include:  Delay or interruption in medical evaluation due to technological equipment failure or disruption; Information transmitted may not be sufficient (e.g. poor resolution of images) to allow for appropriate medical decision making by the  Practitioner; and/or  In rare instances, security protocols could fail, causing a breach of personal health information.  Furthermore, I acknowledge that it is my responsibility to provide information about my medical history, conditions and care that is complete and accurate to the best of my ability. I acknowledge that Practitioner's advice, recommendations, and/or decision may be based on factors not within their control, such as incomplete or inaccurate data provided by me or distortions of diagnostic images or specimens that may result from electronic transmissions. I understand that the practice of medicine is not an exact science and that Practitioner makes no warranties or guarantees regarding treatment outcomes. I acknowledge that a copy of this consent can be made available to me via my patient portal [facility] MyChart), or I can request a printed copy by calling the office of [facility].    I understand that my insurance will be billed for this visit.   I have read or had this consent read to me. I understand the contents of this consent, which adequately explains the benefits and risks of the Services being provided via telemedicine.  I have been provided ample opportunity to ask questions regarding this consent and the Services and have had my questions answered to my satisfaction. I give my informed consent for the services to be provided through the use of telemedicine in my medical care

## 2023-07-02 NOTE — Telephone Encounter (Signed)
Pt wants to know if there is anyway the appt today could be virtual. Please advise

## 2023-07-06 ENCOUNTER — Ambulatory Visit: Payer: Medicaid Other

## 2023-07-13 ENCOUNTER — Ambulatory Visit: Payer: Self-pay | Admitting: Internal Medicine

## 2023-07-22 ENCOUNTER — Other Ambulatory Visit: Payer: Self-pay | Admitting: Internal Medicine

## 2023-07-28 NOTE — Progress Notes (Deleted)
   [address] [place]  [place] [address] Dept: [phone]  FOLLOW UP NOTE  Patient ID: Alice Santiago, female    DOB: 24-Apr-1981  Age: 42 y.o. MRN: [id number] Date of Office Visit: 07/29/2023  Assessment  Chief Complaint: No chief complaint on file.  HPI Alice Santiago   Discussed the use of AI scribe software for clinical note transcription with the patient, who gave verbal consent to proceed.  History of Present Illness             Drug Allergies:  No Known Allergies  Physical Exam: There were no vitals taken for this visit.   Physical Exam  Diagnostics:    Assessment and Plan: No diagnosis found.  No orders of the defined types were placed in this encounter.   There are no Patient Instructions on file for this visit.  No follow-ups on file.    Thank you for the opportunity to care for this patient.  Please do not hesitate to contact me with questions.  Thermon Leyland, FNP [facility]

## 2023-07-29 ENCOUNTER — Ambulatory Visit: Payer: Medicaid Other | Admitting: Family Medicine

## 2023-08-12 ENCOUNTER — Telehealth: Payer: Self-pay | Admitting: Nurse Practitioner

## 2023-08-12 NOTE — Telephone Encounter (Signed)
Called to let her know that her appointment for nurse visit is on tomorrow and that she can go to the lab before she comes to her nurse visit. Patient verbalized understanding.

## 2023-08-12 NOTE — Telephone Encounter (Signed)
  Pt is confused of her appt tomorrow. She said, she was told to go get blood work at 9 am at [facility] then she received a text to be at the office to check blood pressure

## 2023-08-13 ENCOUNTER — Ambulatory Visit: Payer: Medicaid Other | Attending: Internal Medicine

## 2023-08-13 DIAGNOSIS — R030 Elevated blood-pressure reading, without diagnosis of hypertension: Secondary | ICD-10-CM | POA: Diagnosis not present

## 2023-08-13 NOTE — Progress Notes (Signed)
Nurse visit  Home machine is new, when compared with our machine, it is accurate.  Patient stopped amlodipine in September because it made her feel "weird" , would consider another type of bp medication. Does c/o occasional palpitations.   I will forward to the provider

## 2023-08-14 ENCOUNTER — Encounter: Payer: Self-pay | Admitting: *Deleted

## 2023-08-14 ENCOUNTER — Telehealth: Payer: Self-pay | Admitting: Nurse Practitioner

## 2023-08-14 NOTE — Progress Notes (Signed)
Notified via mychart.

## 2023-08-14 NOTE — Telephone Encounter (Signed)
Patient called about mychart message that was sent to her.  She states she has questions.

## 2023-08-14 NOTE — Progress Notes (Signed)
Chart reviewed.  BP is not at goal.  She also admits to palpitations.  Recommend starting metoprolol tartrate 12.5 mg twice daily based on her symptoms.  Let's bring her back in 6 to 8 weeks for follow-up in the office to reevaluate her blood pressure and vital signs.  Recommend continue to log her BP on BP log.  I will also forward this recommendation to her cardiologist so he is aware.  Sharlene Dory, NP

## 2023-08-14 NOTE — Telephone Encounter (Signed)
Notified via mychart.

## 2023-08-14 NOTE — Telephone Encounter (Signed)
Pt called in stating she has been waiting for someone to tell her what blood pressure medicine she should be on. Please advise.

## 2023-08-17 ENCOUNTER — Other Ambulatory Visit: Payer: Self-pay | Admitting: *Deleted

## 2023-08-17 MED ORDER — METOPROLOL TARTRATE 25 MG PO TABS: 12.5000 mg | ORAL_TABLET | Freq: Two times a day (BID) | ORAL | 6 refills | Status: DC

## 2023-08-17 NOTE — Telephone Encounter (Signed)
Per Lanora Manis:  I would advise her to update her PCP to make the provider aware she is on this medication.   Krill oil does have good benefits to the heart, however please make her aware that the best way she can get her Omega-3's is through her diet. Especially consuming cold-water fish like salmon, herring, mackerel, trout and sardines. Other good sources of Omega-3s are flaxseed, chia seed and walnuts.   Thanks!   Best,  Sharlene Dory, NP   Advised patient of information. She stated that she wasn't taking anymore medication that she would change her diet and just take Krill oil and Omega 3s. Advised her to still keep her appointment at this time.

## 2023-08-20 DIAGNOSIS — F4312 Post-traumatic stress disorder, chronic: Secondary | ICD-10-CM | POA: Diagnosis not present

## 2023-08-20 DIAGNOSIS — F3162 Bipolar disorder, current episode mixed, moderate: Secondary | ICD-10-CM | POA: Diagnosis not present

## 2023-08-20 DIAGNOSIS — F411 Generalized anxiety disorder: Secondary | ICD-10-CM | POA: Diagnosis not present

## 2023-08-24 DIAGNOSIS — F411 Generalized anxiety disorder: Secondary | ICD-10-CM | POA: Diagnosis not present

## 2023-08-24 DIAGNOSIS — F3162 Bipolar disorder, current episode mixed, moderate: Secondary | ICD-10-CM | POA: Diagnosis not present

## 2023-08-24 DIAGNOSIS — F4312 Post-traumatic stress disorder, chronic: Secondary | ICD-10-CM | POA: Diagnosis not present

## 2023-08-24 DIAGNOSIS — Z79899 Other long term (current) drug therapy: Secondary | ICD-10-CM | POA: Diagnosis not present

## 2023-10-05 ENCOUNTER — Ambulatory Visit: Payer: Medicaid Other | Admitting: Nurse Practitioner

## 2023-10-05 ENCOUNTER — Telehealth: Payer: Self-pay | Admitting: Cardiology

## 2023-10-05 DIAGNOSIS — Z20822 Contact with and (suspected) exposure to covid-19: Secondary | ICD-10-CM | POA: Diagnosis not present

## 2023-10-05 DIAGNOSIS — Z20818 Contact with and (suspected) exposure to other bacterial communicable diseases: Secondary | ICD-10-CM | POA: Diagnosis not present

## 2023-10-05 DIAGNOSIS — B349 Viral infection, unspecified: Secondary | ICD-10-CM | POA: Diagnosis not present

## 2023-10-05 DIAGNOSIS — R197 Diarrhea, unspecified: Secondary | ICD-10-CM | POA: Diagnosis not present

## 2023-10-05 NOTE — Progress Notes (Deleted)
 Virtual Visit via Telephone Note   Because of [place] M Cui's co-morbid illnesses, she is at least at moderate risk for complications without adequate follow up.  This format is felt to be most appropriate for this patient at this time.  The patient did not have access to video technology/had technical difficulties with video requiring transitioning to audio format only (telephone).  All issues noted in this document were discussed and addressed.  No physical exam could be performed with this format.  Please refer to the patient's chart for her consent to telehealth for [facility].    Date:  07/02/2023 ID:  [place] M Elizardo, DOB [birth year], MRN [id number] The patient was identified using 2 identifiers. Patient Location: Home Provider Location: Office/Clinic  PCP:  Arrie Senate, FNP   [facility] Providers Cardiologist:  Nona Dell, MD     Evaluation Performed:  Follow-Up Visit  Chief Complaint:  Elevated BP follow-up  History of Present Illness:    [place] LEYANA WHIDDEN is a 43 y.o. female with a PMH of chest pain, mixed hyperlipidemia, DOE, history of fatigue, longstanding tobacco abuse, and at risk for OSA, anxiety, depression, who presents today for elevated BP follow-up.   Last seen by Dr. Diona Browner on May 17, 2021.  Has a significant family history of cardiovascular disease.  Her father died at 100 years old with heart attack, he reportedly had heart murmur as well.  At that time, she reported chronic shortness of breath with activity x 1 year, worse with inclines, also noted feeling lightheaded with extreme activities.  At the time, she was planning on undergoing home sleep study evaluation for OSA.  Myoview was arranged and was normal.  Echocardiogram unremarkable.   02/09/2023 - Today she presents for arrhythmia evaluation.  She states she has occasional palpitations. Especially notices palpitations with doing ADL's, feels lightheaded and winded with  this. Does not wear CPAP for OSA, says she could not complete sleep study in the past. Has been weaning off caffeine use. Denies any chest pain, worsening shortness of breath, syncope, presyncope, orthopnea, PND, swelling or significant weight changes, acute bleeding, or claudication. Denies any alcohol use. Smokes around 1/2 PPD, smokes marijuana occasionally. Does not check BP at home.   ED visit that day elevated blood pressure, left AMA. She called the next day reporting SBP 160's - 170's. Was started on Amlodipine 2.5 mg daily and referred to Psychiatry for anxiety.   07/02/2023 - Patient is here for telephone visit and unable to come to office due to transportation issues. Truck has broken down and needs to be fixed per her report.  Says her palpitations have gotten better since last office visit after she quit drinking caffeine and they have seemed to settle down.  Could not tolerate amlodipine, medication made her sick and feels better off medication.  She expresses frustration at checking BP at home, due to what she believes are inaccurate readings because at the office visits her blood pressure readings are WNL, says checking BPs at home has also caused her anxiety. Denies any chest pain, shortness of breath, palpitations, syncope, presyncope, dizziness, orthopnea, PND, swelling or significant weight changes, acute bleeding, or claudication.  ROS:   Please see the history of present illness.     All other systems reviewed and are negative.   Prior CV studies:   The following studies were reviewed today:  Cardiac monitor 01/2023:  Patient has slight ectopic beats and due to idioventricular rhythm and  Second Degree AV Block-Mobitz I being present recommend discontinuing the propranolol at this time and will refer to cardiology for additional work up.   Echo 06/2021:  1. Left ventricular ejection fraction, by estimation, is 60 to 65%. The  left ventricle has normal function. The left  ventricle has no regional  wall motion abnormalities. There is mild left ventricular hypertrophy.  Left ventricular diastolic parameters  were normal.   2. Right ventricular systolic function is normal. The right ventricular  size is normal. Tricuspid regurgitation signal is inadequate for assessing  PA pressure.   3. The mitral valve is grossly normal. Trivial mitral valve  regurgitation.   4. The aortic valve is tricuspid. Aortic valve regurgitation is not  visualized.   5. The inferior vena cava is normal in size with greater than 50%  respiratory variability, suggesting right atrial pressure of 3 mmHg.   Comparison(s): No prior Echocardiogram.   Myoview 05/2021:    The study is normal. There are no perfusion defects consistent with prior infarct or current ischemia The study is low risk.   No ST deviation was noted.   Left ventricular function is normal. Nuclear stress EF: 68 %. The left ventricular ejection fraction is hyperdynamic (>65%). End diastolic cavity size is normal.  Labs/Other Tests and Data Reviewed:    EKG:  EKG is not ordered today.   Risk Assessment/Calculations:    The 10-year ASCVD risk score (Arnett DK, et al., 2019) is: 6.2%   Values used to calculate the score:     Age: 56 years     Sex: Female     Is Non-Hispanic African American: No     Diabetic: No     Tobacco smoker: Yes     Systolic Blood Pressure: 137 mmHg     Is BP treated: Yes     HDL Cholesterol: 41 mg/dL     Total Cholesterol: 195 mg/dL  Objective:    Vital Signs:  There were no vitals taken for this visit.   Due to nature of today's visit, physical exam was unable to be performed.  ASSESSMENT & PLAN:    Elevated BP No home readings to evaluate BP control, however pt states clinic BP readings are at goal.  Currently not on any antihypertensive medication. Could not tolerate low dose Amlodipine according to her report.  Will bring her back in 6 weeks for BP check, says this is when she  is able to come into the office. Heart healthy diet and regular cardiovascular exercise encouraged. Care and ED precautions discussed.  2. Mixed HLD Due for labwork to be checked.  Will obtain FLP. Currently not on any lipid lowering medications. Heart healthy diet and regular cardiovascular exercise encouraged.    Time:   Today, I have spent 22 minutes with the patient with telehealth technology discussing the above problems.     Medication Adjustments/Labs and Tests Ordered: Current medicines are reviewed at length with the patient today.  Concerns regarding medicines are outlined above.   Tests Ordered: No orders of the defined types were placed in this encounter.   Medication Changes: No orders of the defined types were placed in this encounter.   Follow Up:  In Person in 6 month(s)  Signed, Sharlene Dory, NP

## 2023-10-05 NOTE — Telephone Encounter (Signed)
Pt came in to [facility] office, She is looking for a blood pressure medicine that was prescribed in December. The Pharmacy has discontinued and she will need a new RX sent in.  When seen today at [facility] her blood pressure was 147/101. She did mention that she has changed her eating habits and tried to lower it the natural way!

## 2023-10-05 NOTE — Telephone Encounter (Signed)
Patient reports she didn't take lopressor that was prescribed in December 2024 due to the side effects she read about. Reports she was afraid to take it. Advised that it would benefit her BP if she would take the medication as prescribed and if she has side effects we could stop it. Also advised that she needed a sooner appointment and scheduled her to see Philis Nettle on 11/05/2023 @4 :00 pm. Advised to contact her PCP for BP evaluation prior to 11/05/2023 visit. Advised to monitor BP daily and bring readings to visit.  Spoke with Marchelle Folks at [facility] and advised that metoprolol tartrate 12.5 mg prescription that was sent in 08/13/2024 can be filled. Mychart message sent to patient to make her aware.

## 2023-10-06 ENCOUNTER — Ambulatory Visit: Payer: Self-pay | Admitting: Family Medicine

## 2023-10-06 ENCOUNTER — Telehealth: Payer: Medicaid Other | Admitting: Family Medicine

## 2023-10-06 ENCOUNTER — Encounter: Payer: Self-pay | Admitting: Family Medicine

## 2023-10-06 DIAGNOSIS — F411 Generalized anxiety disorder: Secondary | ICD-10-CM | POA: Diagnosis not present

## 2023-10-06 DIAGNOSIS — I10 Essential (primary) hypertension: Secondary | ICD-10-CM

## 2023-10-06 NOTE — Telephone Encounter (Signed)
Chief Complaint: HTN Symptoms: BP reading of 161/106 Frequency: Now Pertinent Negatives: Patient denies chest pain, difficulty breathing, headache Disposition: [] ED /[] Urgent Care (no appt availability in office) / [x] Appointment(In office/virtual)/ []  [facility]/ [] Home Care/ [] Refused Recommended Disposition /[] [facility]/ []  Follow-up with PCP Additional Notes: Patient called in reporting a blood pressure of 161/110, heartrate of 123. Patient transferred to nurse triage line for evaluation. Patient states she is experiencing a stomach bug and went to Urgent Care yesterday, with a diastolic of 110. UC sent her home with metoprolol tartrate 1/2 tablet of 25 mg, twice a day. Patient called in this morning with a BP reading of 161/110 and heartrate of 123. Patient states she might be anxious as well, she is prescribed Xanax 0.5 mg. Patient stated after that BP reading, she had taken her metoprolol tartrate 1/2 of 25 mg. After 30 minutes of taking medication, BP went down to 160/98, heartrate 97. Patient asked if she could take her xanax after taking the metoprolol tartrate, this RN approved. BP is now at 144/85. Patient states she was not able to go to her Cardiology appointment yesterday due to a stomach bug. Patient states she cannot get into the office today but wants a virtual appointment to discuss with provider. Virtual appointment created with PCP.   Copied from CRM [id number]. Topic: Clinical - Red Word Triage >> Oct 06, 2023  9:46 AM Joanette Gula wrote: 161/110/123 Reason for Disposition  Systolic BP  >= 160 OR Diastolic >= 100  Answer Assessment - Initial Assessment Questions 1. BLOOD PRESSURE: "What is the blood pressure?" "Did you take at least two measurements 5 minutes apart?"     161/106 at 0915, 160/98 at 0946 and after taking the half tablet of the metoprolol tartrate 25 mg as prescribed, at 0915. Patient checked on more time while on the phone - at 1002 - 144/85.  Patient also took her prescribed 0.5 mg xanax 2. ONSET: "When did you take your blood pressure?"     5 minutes ago 3. HOW: "How did you take your blood pressure?" (e.g., automatic home BP monitor, visiting nurse)     Automatic blood pressure cuff at home, sitting up in recliner, upper arm  4. HISTORY: "Do you have a history of high blood pressure?"     Yes - seen in UC for it and prescribed metoprolol tartrate 25 mg, half tablet twice a day 5. MEDICINES: "Are you taking any medicines for blood pressure?" "Have you missed any doses recently?"     Metoprolol tartrate 25 mg, half tablet twice a day 6. OTHER SYMPTOMS: "Do you have any symptoms?" (e.g., blurred vision, chest pain, difficulty breathing, headache, weakness)     No 7. PREGNANCY: "Is there any chance you are pregnant?" "When was your last menstrual period?"     No  Protocols used: Blood Pressure - High-A-AH

## 2023-10-06 NOTE — Progress Notes (Signed)
Virtual Visit via Video Note  I connected with Alice Santiago on 10/06/23 at  1:05 PM EST by a video enabled telemedicine application and verified that I am speaking with the correct person using two identifiers.  Patient Location: Home Provider Location: Office/Clinic  I discussed the limitations, risks, security, and privacy concerns of performing an evaluation and management service by video and the availability of in person appointments. I also discussed with the patient that there may be a patient responsible charge related to this service. The patient expressed understanding and agreed to proceed.  Subjective: PCP: Arrie Senate, FNP  Chief Complaint  Patient presents with   Hypertension   HPI States that she has not been feeling well. She went to [facility] yesterday. She was negative for all tests (flu, covid, rsv, strep) at urgent care. States that her BP was elevated at urgent care and she was recommended to follow up. Missed her appt with Cardiology yesterday. States that she was prescribed medication in December but she was not taking it as she was scared to try it and wanted to work on diet and lifestyle at home. Cut out caffeine and is reducing fried, fatty foods in her diet. She checked her BP at home at 5:00 pm yesterday. States that it was 150/90. At 6 pm 133/87.  Checked her BP at 9:15 am today 161/106. HR 123. Took 1/2 metoprolol.  11:56 BP 157/96. HR 72.  Today during visit BP is 133/96  Anxiety  States that she is having increased anxiety symptoms. She is stressed about BP and cardiac conditions. States that klonopin did not work for her. She does not feel that her anxiety is controlled. She would like to be on a daily medication. She is very tearful during exam and reports extreme stress that she is at home alone through the day and does not have people to check on her. Her son still lives with her but has graduated and is working, in school, and spends a lot    ROS: Per HPI  Current Outpatient Medications:    azelastine (ASTELIN) 0.1 % nasal spray, Place 1 spray into both nostrils 2 (two) times daily as needed for rhinitis. Use in each nostril as directed, Disp: 30 mL, Rfl: 5   Blood Pressure Monitoring (B-D ASSURE BPM/AUTO ARM CUFF) MISC, Use as directed, Disp: 1 each, Rfl: 0   budesonide-formoterol (SYMBICORT) 160-4.5 MCG/ACT inhaler, Inhale 2 puffs into the lungs 2 (two) times daily., Disp: 1 each, Rfl: 5   cetirizine (ZYRTEC) 10 MG tablet, Take 10 mg by mouth as needed for allergies., Disp: , Rfl:    clonazePAM (KLONOPIN) 0.5 MG tablet, Take 0.5 mg by mouth 2 (two) times daily., Disp: , Rfl:    levalbuterol (XOPENEX HFA) 45 MCG/ACT inhaler, Inhale 2 puffs into the lungs every 4 (four) hours as needed for wheezing., Disp: 1 each, Rfl: 5   metoprolol tartrate (LOPRESSOR) 25 MG tablet, Take 0.5 tablets (12.5 mg total) by mouth 2 (two) times daily., Disp: 60 tablet, Rfl: 6   ondansetron (ZOFRAN) 4 MG tablet, Take 1 tablet (4 mg total) by mouth every 8 (eight) hours as needed for nausea or vomiting., Disp: 20 tablet, Rfl: 0   RESTASIS 0.05 % ophthalmic emulsion, 1 drop 2 (two) times daily., Disp: , Rfl:   Observations/Objective: There were no vitals filed for this visit. Physical Exam Constitutional:      General: She is awake. She is not in acute distress.  Appearance: Normal appearance. She is well-developed and well-groomed. She is not ill-appearing, toxic-appearing or diaphoretic.  Pulmonary:     Effort: Pulmonary effort is normal.  Neurological:     General: No focal deficit present.     Mental Status: She is alert and oriented to person, place, and time.  Psychiatric:        Attention and Perception: Attention and perception normal.        Mood and Affect: Mood is anxious. Affect is tearful.        Speech: Speech normal.        Behavior: Behavior is agitated and hyperactive. Behavior is cooperative.        Thought Content: Thought  content normal.        Cognition and Memory: Cognition and memory normal.        Judgment: Judgment normal.    Assessment and Plan: 1. Primary hypertension (Primary) Discussed with patient to continue metoprolol as prescribed by Cardiology. Discussed at length with patient that this medication is not first line treatment for hypertension, but that it has some effect on BP. Would like for her to be on it for some time before we add in additional antihypertensives. Discussed hold parameters of medication. Patient to monitor BP at home twice daily for one week and then follow up.   2. GAD (generalized anxiety disorder) Continues to have uncontrolled anxiety. Encouraged patient at length to follow up with psychiatrist for daily treatment instead of chronic benzodiazepine use.   Appt time 10/06/2023 01:05 PM Call duration: 00:37:16  Follow Up Instructions: Return in about 1 week (around 10/13/2023).   I discussed the assessment and treatment plan with the patient. The patient was provided an opportunity to ask questions, and all were answered. The patient agreed with the plan and demonstrated an understanding of the instructions.   The patient was advised to call back or seek an in-person evaluation if the symptoms worsen or if the condition fails to improve as anticipated.  The above assessment and management plan was discussed with the patient. The patient verbalized understanding of and has agreed to the management plan.   Neale Burly, DNP-FNP [facility] [address] [place], [place] [address] [phone]

## 2023-10-07 ENCOUNTER — Encounter: Payer: Self-pay | Admitting: Family Medicine

## 2023-10-07 ENCOUNTER — Telehealth: Payer: Medicaid Other | Admitting: Family Medicine

## 2023-10-07 DIAGNOSIS — F411 Generalized anxiety disorder: Secondary | ICD-10-CM

## 2023-10-07 DIAGNOSIS — F39 Unspecified mood [affective] disorder: Secondary | ICD-10-CM

## 2023-10-07 DIAGNOSIS — I10 Essential (primary) hypertension: Secondary | ICD-10-CM

## 2023-10-07 DIAGNOSIS — F339 Major depressive disorder, recurrent, unspecified: Secondary | ICD-10-CM | POA: Diagnosis not present

## 2023-10-07 NOTE — Patient Instructions (Signed)
[facility]  Phone:  [phone]  Address:  [address]  [place], [place] [address]  Hours:  Open 24/7, No appointment required.    [facility] ([facility] is centrally located in the [place] at [address] [place]

## 2023-10-07 NOTE — Progress Notes (Signed)
Virtual Visit via Video Note  I connected with Alice Santiago on 10/07/23 at  2:05 PM EST by a video enabled telemedicine application and verified that I am speaking with the correct person using two identifiers.  Patient Location: Home Provider Location: Office/Clinic  I discussed the limitations, risks, security, and privacy concerns of performing an evaluation and management service by video and the availability of in person appointments. I also discussed with the patient that there may be a patient responsible charge related to this service. The patient expressed understanding and agreed to proceed.  Subjective: PCP: Arrie Senate, FNP  Chief Complaint  Patient presents with   Insomnia   Insomnia  Does not feel that her anxiety is controlled. She would like to be referred to another psychiatric provider.  States that she has not smoked marijuana or cigarettes in a few days and feels that she is is withdrawing. States that she is having trouble sleeping, she is sweating, feels her heart pounding throughout the day. She is very tearful. States that her hands are clammy. She denies SI. She is taking her BP frequenltly through the day. States that she is averaging 111-135 SBP, 70-85 DBP. Her HR is ranging from 66 - 109 during the day.   ROS: Per HPI  Current Outpatient Medications:    azelastine (ASTELIN) 0.1 % nasal spray, Place 1 spray into both nostrils 2 (two) times daily as needed for rhinitis. Use in each nostril as directed, Disp: 30 mL, Rfl: 5   Blood Pressure Monitoring (B-D ASSURE BPM/AUTO ARM CUFF) MISC, Use as directed, Disp: 1 each, Rfl: 0   budesonide-formoterol (SYMBICORT) 160-4.5 MCG/ACT inhaler, Inhale 2 puffs into the lungs 2 (two) times daily., Disp: 1 each, Rfl: 5   cetirizine (ZYRTEC) 10 MG tablet, Take 10 mg by mouth as needed for allergies., Disp: , Rfl:    clonazePAM (KLONOPIN) 0.5 MG tablet, Take 0.5 mg by mouth 2 (two) times daily., Disp: , Rfl:     levalbuterol (XOPENEX HFA) 45 MCG/ACT inhaler, Inhale 2 puffs into the lungs every 4 (four) hours as needed for wheezing., Disp: 1 each, Rfl: 5   metoprolol tartrate (LOPRESSOR) 25 MG tablet, Take 0.5 tablets (12.5 mg total) by mouth 2 (two) times daily., Disp: 60 tablet, Rfl: 6   ondansetron (ZOFRAN) 4 MG tablet, Take 1 tablet (4 mg total) by mouth every 8 (eight) hours as needed for nausea or vomiting., Disp: 20 tablet, Rfl: 0   RESTASIS 0.05 % ophthalmic emulsion, 1 drop 2 (two) times daily., Disp: , Rfl:   Observations/Objective: There were no vitals filed for this visit. Physical Exam Constitutional:      General: She is awake. She is not in acute distress.    Appearance: Normal appearance. She is well-developed and well-groomed. She is not ill-appearing, toxic-appearing or diaphoretic.  Pulmonary:     Effort: Pulmonary effort is normal.  Neurological:     General: No focal deficit present.     Mental Status: She is alert and oriented to person, place, and time.  Psychiatric:        Attention and Perception: Attention and perception normal.        Mood and Affect: Mood is anxious. Affect is tearful.        Speech: Speech is rapid and pressured and tangential.        Behavior: Behavior is hyperactive. Behavior is cooperative.        Thought Content: Thought content normal.  Cognition and Memory: Cognition and memory normal.        Judgment: Judgment normal.    Assessment and Plan: 1. GAD (generalized anxiety disorder) (Primary) Not at goal. Patient is very anxious, tearful during exam. She requires multiple redirections and reassurance. She would like to start seeing a different provider for psychiatry so that I can be in communication with them and can discuss her care. Referral placed as below. Will reach out to referral coordinator and psychiatry. Denies SI. Safety contract established. Patient refused information on behavioral health urgent cares.  - Ambulatory referral to  Psychiatry  2. Mood disorder (HCC) As above.  - Ambulatory referral to Psychiatry  3. Depression, recurrent (HCC) As above.  - Ambulatory referral to Psychiatry  4. Primary hypertension Patient at home reports averaging 111/80-130/75. Patient to continue current regimen. Will coordinate with cardiology given previous abnormal zio.   Appt time 10/07/2023 02:05 PM Call duration: 00:25:40  Follow Up Instructions: Return in about 1 week (around 10/14/2023) for Chronic Condition Follow up.   I discussed the assessment and treatment plan with the patient. The patient was provided an opportunity to ask questions, and all were answered. The patient agreed with the plan and demonstrated an understanding of the instructions.   The patient was advised to call back or seek an in-person evaluation if the symptoms worsen or if the condition fails to improve as anticipated.  The above assessment and management plan was discussed with the patient. The patient verbalized understanding of and has agreed to the management plan.   Neale Burly, DNP-FNP [facility] [address] [place], [place] [address] [phone]

## 2023-10-09 NOTE — Telephone Encounter (Signed)
Copied from CRM [id number]. Topic: Clinical - Medical Advice >> Oct 09, 2023  1:27 PM Monisha R wrote: Reason for CRM:  Patient been on blood pressure medication and dont like the way it makes her feel, nausea, irritable, not able to sleep and wants to see what can be done to deal with until seen next week. Only feels this way when take blood pressure medication. Not able to eat anything.

## 2023-10-10 ENCOUNTER — Other Ambulatory Visit: Payer: Self-pay | Admitting: Internal Medicine

## 2023-10-12 ENCOUNTER — Ambulatory Visit: Payer: Medicaid Other | Admitting: Internal Medicine

## 2023-10-16 ENCOUNTER — Ambulatory Visit: Payer: Medicaid Other | Admitting: Family Medicine

## 2023-10-16 ENCOUNTER — Encounter: Payer: Self-pay | Admitting: Family Medicine

## 2023-10-16 ENCOUNTER — Other Ambulatory Visit: Payer: Medicaid Other

## 2023-10-16 VITALS — BP 124/75 | HR 86 | Temp 98.3°F | Ht 63.0 in | Wt 197.0 lb

## 2023-10-16 DIAGNOSIS — R6889 Other general symptoms and signs: Secondary | ICD-10-CM | POA: Diagnosis not present

## 2023-10-16 DIAGNOSIS — I10 Essential (primary) hypertension: Secondary | ICD-10-CM | POA: Diagnosis not present

## 2023-10-16 DIAGNOSIS — F1910 Other psychoactive substance abuse, uncomplicated: Secondary | ICD-10-CM | POA: Insufficient documentation

## 2023-10-16 DIAGNOSIS — R7303 Prediabetes: Secondary | ICD-10-CM | POA: Insufficient documentation

## 2023-10-16 DIAGNOSIS — F339 Major depressive disorder, recurrent, unspecified: Secondary | ICD-10-CM | POA: Diagnosis not present

## 2023-10-16 DIAGNOSIS — E559 Vitamin D deficiency, unspecified: Secondary | ICD-10-CM | POA: Insufficient documentation

## 2023-10-16 DIAGNOSIS — E782 Mixed hyperlipidemia: Secondary | ICD-10-CM

## 2023-10-16 DIAGNOSIS — R002 Palpitations: Secondary | ICD-10-CM | POA: Diagnosis not present

## 2023-10-16 DIAGNOSIS — J4540 Moderate persistent asthma, uncomplicated: Secondary | ICD-10-CM

## 2023-10-16 DIAGNOSIS — M47816 Spondylosis without myelopathy or radiculopathy, lumbar region: Secondary | ICD-10-CM | POA: Insufficient documentation

## 2023-10-16 DIAGNOSIS — K219 Gastro-esophageal reflux disease without esophagitis: Secondary | ICD-10-CM | POA: Insufficient documentation

## 2023-10-16 DIAGNOSIS — F17201 Nicotine dependence, unspecified, in remission: Secondary | ICD-10-CM

## 2023-10-16 DIAGNOSIS — F17200 Nicotine dependence, unspecified, uncomplicated: Secondary | ICD-10-CM | POA: Insufficient documentation

## 2023-10-16 DIAGNOSIS — F411 Generalized anxiety disorder: Secondary | ICD-10-CM

## 2023-10-16 LAB — LIPID PANEL

## 2023-10-16 NOTE — Progress Notes (Signed)
 Subjective:  Alice Santiago ID: Alice Santiago, female    DOB: 14-Oct-1980, 43 y.o.   MRN: [id number]  Alice Santiago Care Team: Cathlene Marry Lenis, FNP as PCP - General (Family Medicine) Debera Jayson MATSU, MD as PCP - Cardiology (Cardiology)   Chief Complaint:  Hypertension (B/p check)   HPI: Alice Santiago is a 43 y.o. female presenting on 10/16/2023 for Hypertension (B/p check)  HPI 1. Primary hypertension Has BP monitor at home Yes BP at home average 120/80s States that she stopped metop 1 week ago. Last dose 10/09/23. She states that she feels much better. She feels that she no longer feels weird, forgetful, anxious.  Her BP at home has been running 100-120/80. She has a max of 140 SBP a few times over the last few weeks.  ROS  Denies fatigue, peripheral edema, changes to vision, chest pain, headaches, SOB, PND, orthopnea Endorses anxiety States that she feels palpitations every now and then.  States that night sweating has stopped with stopping metoprolol .  Established with Cardiology  CAD risks smoker, hypertension, hypercholesterolemia/hyperlipidemia  2. Depression, recurrent (HCC)/3. GAD (generalized anxiety disorder) Established with psychiatry. Julie McClure has appt next week for follow up.  States that anxiety has been better. States that sometimes she is more irritable.  Current treatment includes: Alprazolam  0.5 mg tablet   Side effects: none  Desire for counseling no  Endorses thoughts of self harm: No  4. Mixed hyperlipidemia Lipid/Cholesterol, Follow-up  Last lipid panel Other pertinent labs  Lab Results  Component Value Date   CHOL 206 (H) 04/02/2021   HDL 38 (L) 04/02/2021   LDLCALC 123 (H) 04/02/2021   TRIG 255 (H) 04/02/2021   CHOLHDL 5.4 (H) 04/02/2021   Lab Results  Component Value Date   ALT 10 12/31/2022   AST 15 12/31/2022   PLT 306 12/31/2022   TSH 2.050 12/31/2022     She was last seen for this 2 months ago.  Management since that visit  includes nothing. Symptoms: No chest pain No chest pressure/discomfort  No dyspnea No lower extremity edema  No numbness or tingling of extremity No orthopnea  No palpitations No paroxysmal nocturnal dyspnea  No speech difficulty No syncope   Current diet:  has cut out all red meats. Continues to eat fish and chicken. She is still eating some processed food. Cut out caffeine drinks.  Current exercise:  sometimes. Has not exercised in the past week. House work.   The 10-year ASCVD risk score (Arnett DK, et al., 2019) is: 6.2%  5. Prediabetes Making changes to diet as above.   6. Vitamin D  deficiency Vitamin D  deficiency, follow-up  Lab Results  Component Value Date   VD25OH 28.7 (L) 03/10/2023   VD25OH 11.7 (L) 12/31/2022   CALCIUM 9.2 12/31/2022   CALCIUM 9.8 10/01/2021        Wt Readings from Last 3 Encounters:  07/02/23 204 lb 12.8 oz (92.9 kg)  04/08/23 208 lb 6.4 oz (94.5 kg)  03/10/23 205 lb (93 kg)    She was last seen for vitamin D  deficiency 6 months ago.  Management since that visit includes continues to take daily supplement . She reports excellent compliance with treatment. She is not having side effects.   Symptoms: No change in energy level No numbness or tingling  No bone pain No unexplained fracture   ---------------------------------------------------------------------------------------------------  7. Moderate persistent asthma without complication States that she is not waking up with symptoms. Reports breathing is doing  well. She is not limited in her activities. Using albuterol  nebulizer rarely, once every few weeks. Symbicort  is daily. Does not have perfect compliance.   8. Palpitations States that this has improved but she can still feel them on occasion. Does not have associated dizziness or chest pain, shortness of breath.    9. Tobacco Use in early remission  Stopped two weeks ago   10. Substance use  Using marijuana a few times per week.    Relevant past medical, surgical, family, and social history reviewed and updated as indicated.  Allergies and medications reviewed and updated. Data reviewed: Chart in Epic.   Past Medical History:  Diagnosis Date   Anxiety    Asthma    Back problem    Breast tenderness in female 04/23/2015   Bronchitis    BV (bacterial vaginosis) 03/14/2015   Depression    History of chlamydia 04/23/2015   Mixed hyperlipidemia 04/03/2021   Vaginal Pap smear, abnormal     Past Surgical History:  Procedure Laterality Date   ANTERIOR CERVICAL DECOMP/DISCECTOMY FUSION  10/08/2017   Procedure: Anterior Cervical Decompression/Discectomy Fusion - Cervical five-Cervical six;  Surgeon: Joshua Alm RAMAN, MD;  Location: [facility] OR;  Service: Neurosurgery;;   CESAREAN SECTION     times 2   LUMBAR LAMINECTOMY/DECOMPRESSION MICRODISCECTOMY Left 11/10/2014   Procedure: LUMBAR LAMINECTOMY/DECOMPRESSION MICRODISCECTOMY LEFT LUMBAR FIVE-SACRAL ONE;  Surgeon: Alm RAMAN Joshua, MD;  Location: [facility];  Service: Neurosurgery;  Laterality: Left;  left   TONSILLECTOMY     TOTAL ABDOMINAL HYSTERECTOMY     TUBAL LIGATION      Social History   Socioeconomic History   Marital status: Single    Spouse name: Not on file   Number of children: 2   Years of education: Not on file   Highest education level: Not on file  Occupational History   Not on file  Tobacco Use   Smoking status: Every Day    Current packs/day: 0.50    Average packs/day: 0.5 packs/day for 21.0 years (10.5 ttl pk-yrs)    Types: Cigarettes   Smokeless tobacco: Never   Tobacco comments:    Trying to cut back   Vaping Use   Vaping status: Former  Substance and Sexual Activity   Alcohol use: Not Currently    Comment: occ   Drug use: Yes    Types: Marijuana   Sexual activity: Not Currently    Birth control/protection: Surgical    Comment: hyst  Other Topics Concern   Not on file  Social History Narrative   Not on file   Social Drivers  of Health   Financial Resource Strain: Not on file  Food Insecurity: Not on file  Transportation Needs: Not on file  Physical Activity: Not on file  Stress: Not on file  Social Connections: Not on file  Intimate Partner Violence: Not on file    Outpatient Encounter Medications as of 10/16/2023  Medication Sig   azelastine  (ASTELIN ) 0.1 % nasal spray Place 1 spray into both nostrils 2 (two) times daily as needed for rhinitis. Use in each nostril as directed   Blood Pressure Monitoring (B-D ASSURE BPM/AUTO ARM CUFF) MISC Use as directed   budesonide -formoterol  (SYMBICORT ) 160-4.5 MCG/ACT inhaler INHALE TWO PUFFS TWICE DAILY   cetirizine  (ZYRTEC ) 10 MG tablet Take 10 mg by mouth as needed for allergies.   clonazePAM  (KLONOPIN ) 0.5 MG tablet Take 0.5 mg by mouth 2 (two) times daily.   levalbuterol  (XOPENEX  HFA) 45 MCG/ACT  inhaler Inhale 2 puffs into the lungs every 4 (four) hours as needed for wheezing.   metoprolol  tartrate (LOPRESSOR ) 25 MG tablet Take 0.5 tablets (12.5 mg total) by mouth 2 (two) times daily.   ondansetron  (ZOFRAN ) 4 MG tablet Take 1 tablet (4 mg total) by mouth every 8 (eight) hours as needed for nausea or vomiting.   RESTASIS 0.05 % ophthalmic emulsion 1 drop 2 (two) times daily.   No facility-administered encounter medications on file as of 10/16/2023.    No Known Allergies  Review of Systems As per HPI  Objective:  BP 124/75   Pulse 86   Temp 98.3 F (36.8 C)   Ht 5' 3 (1.6 m)   Wt 197 lb (89.4 kg)   SpO2 98%   BMI 34.90 kg/m    Wt Readings from Last 3 Encounters:  07/02/23 204 lb 12.8 oz (92.9 kg)  04/08/23 208 lb 6.4 oz (94.5 kg)  03/10/23 205 lb (93 kg)    Physical Exam Constitutional:      General: She is awake. She is not in acute distress.    Appearance: Normal appearance. She is well-developed and well-groomed. She is obese. She is not ill-appearing, toxic-appearing or diaphoretic.  Cardiovascular:     Rate and Rhythm: Normal rate and regular  rhythm.     Pulses: Normal pulses.          Radial pulses are 2+ on the right side and 2+ on the left side.       Posterior tibial pulses are 2+ on the right side and 2+ on the left side.     Heart sounds: Normal heart sounds. No murmur heard.    No gallop.  Pulmonary:     Effort: Pulmonary effort is normal. No respiratory distress.     Breath sounds: Normal breath sounds. No stridor. No wheezing, rhonchi or rales.  Musculoskeletal:     Cervical back: Full passive range of motion without pain and neck supple.     Right lower leg: No edema.     Left lower leg: No edema.  Skin:    General: Skin is warm.     Capillary Refill: Capillary refill takes less than 2 seconds.  Neurological:     General: No focal deficit present.     Mental Status: She is alert, oriented to person, place, and time and easily aroused. Mental status is at baseline.     GCS: GCS eye subscore is 4. GCS verbal subscore is 5. GCS motor subscore is 6.     Motor: No weakness.  Psychiatric:        Attention and Perception: Attention and perception normal.        Mood and Affect: Mood and affect normal.        Speech: Speech normal.        Behavior: Behavior normal. Behavior is cooperative.        Thought Content: Thought content normal. Thought content does not include homicidal or suicidal ideation. Thought content does not include homicidal or suicidal plan.        Cognition and Memory: Cognition and memory normal.        Judgment: Judgment normal.     Comments: Much improved from prior, no longer as jittery, tearful, rapid speech.      Results for orders placed or performed in visit on 03/10/23  Urine Culture   Collection Time: 03/10/23  3:55 PM   Specimen: Urine   UR  Result Value  Ref Range   Urine Culture, Routine Final report    Organism ID, Bacteria Comment   Urinalysis, Routine w reflex microscopic   Collection Time: 03/10/23  3:55 PM  Result Value Ref Range   Specific Gravity, UA 1.020 1.005 - 1.030    pH, UA 7.0 5.0 - 7.5   Color, UA Yellow Yellow   Appearance Ur Clear Clear   Leukocytes,UA Negative Negative   Protein,UA Negative Negative/Trace   Glucose, UA Negative Negative   Ketones, UA Negative Negative   RBC, UA Negative Negative   Bilirubin, UA Negative Negative   Urobilinogen, Ur 0.2 0.2 - 1.0 mg/dL   Nitrite, UA Negative Negative  WET PREP FOR TRICH, YEAST, CLUE   Collection Time: 03/10/23  4:18 PM   Specimen: Vaginal Swab   Vaginal Swab  Result Value Ref Range   Trichomonas Exam Negative Negative   Yeast Exam Negative Negative   Clue Cell Exam Positive (A) Negative  Ct Ng M genitalium NAA, Urine   Collection Time: 03/10/23  4:18 PM  Result Value Ref Range   Mycoplasma genitalium NAA Negative Negative   Chlamydia trachomatis, NAA Negative Negative   Neisseria gonorrhoeae, NAA Negative Negative  Bayer DCA Hb A1c Waived   Collection Time: 03/10/23  4:18 PM  Result Value Ref Range   HB A1C (BAYER DCA - WAIVED) 6.2 (H) 4.8 - 5.6 %  RPR   Collection Time: 03/10/23  4:21 PM  Result Value Ref Range   RPR Ser Ql Non Reactive Non Reactive  HepB+HepC+HIV Panel   Collection Time: 03/10/23  4:21 PM  Result Value Ref Range   Hepatitis B Surface Ag Negative Negative   Hep B E Ag Negative Negative   Hep B C IgM Negative Negative   Hep B Core Total Ab Negative Negative   Hep B E Ab Non Reactive Negative   Hep B Surface Ab, Qual Non Reactive    Hep C Virus Ab Non Reactive Non Reactive   HIV Screen 4th Generation wRfx Non Reactive Non Reactive  VITAMIN D  25 Hydroxy (Vit-D Deficiency, Fractures)   Collection Time: 03/10/23  4:21 PM  Result Value Ref Range   Vit D, 25-Hydroxy 28.7 (L) 30.0 - 100.0 ng/mL       10/16/2023    9:41 AM 01/30/2023   11:45 AM 12/31/2022    3:27 PM 10/01/2021    3:25 PM 04/02/2021   12:33 PM  Depression screen PHQ 2/9  Decreased Interest 1 2 1  0 1  Down, Depressed, Hopeless  2 1 0 1  PHQ - 2 Score 1 4 2  0 2  Altered sleeping 2 2 1  3   Tired,  decreased energy 1 2 1  1   Change in appetite 1 2 1  1   Feeling bad or failure about yourself  0 0 0  1  Trouble concentrating  2 0  1  Moving slowly or fidgety/restless 0 0 1  0  Suicidal thoughts 0 0   0  PHQ-9 Score 5 12 6  9   Difficult doing work/chores     Not difficult at all       10/16/2023    9:41 AM 01/30/2023   11:45 AM 12/31/2022    3:28 PM 04/02/2021   12:34 PM  GAD 7 : Generalized Anxiety Score  Nervous, Anxious, on Edge 1 0 0 3  Control/stop worrying  0 0 0  Worry too much - different things  2 1 0  Trouble relaxing  1 0 0 0  Restless 0  0 0  Easily annoyed or irritable 1 1 0 1  Afraid - awful might happen 0 0 0 0  Total GAD 7 Score   1 4  Anxiety Difficulty    Very difficult   Pertinent labs & imaging results that were available during my care of the Alice Santiago were reviewed by me and considered in my medical decision making.  Assessment & Plan:  Alice Santiago was seen today for hypertension.  Diagnoses and all orders for this visit:  1. Primary hypertension (Primary) Well controlled. Can stay off of metoprolol  for now. We will follow up in 6 weeks to monitor. Labs as below. Will communicate results to Alice Santiago once available. Will await results to determine next steps. Alice Santiago to continue to take BP at home for 2 weeks and send in measurements for review.  - CMP14+EGFR  2. Depression, recurrent (HCC) Well controlled. Denies SI. Continue to follow with psychiatry.   3. GAD (generalized anxiety disorder) As above.   4. Mixed hyperlipidemia Labs as below. Will communicate results to Alice Santiago once available. Will await results to determine next steps.  - Lipid panel  5. Prediabetes Labs as below. Will communicate results to Alice Santiago once available. Will await results to determine next steps.  - Bayer DCA Hb A1c Waived  6. Vitamin D  deficiency Labs as below. Will communicate results to Alice Santiago once available. Will await results to determine next steps. Continues to take  supplement.  - VITAMIN D  25 Hydroxy (Vit-D Deficiency, Fractures)  7. Moderate persistent asthma without complication Well controlled. Continue current regimen.   8. Palpitations Reviewed cardiology notes from [place], NP 07/02/23 Reviewed Zio from 01/20/2023 Labs as below. Will communicate results to Alice Santiago once available. Will await results to determine next steps.  - Anemia Profile B - TSH  9. Tobacco use disorder, moderate, in early remission Quit smoking ~2 weeks ago. Praised Alice Santiago on her cessation.   10. Substance abuse (HCC) Continues to smoke marijuana a few times per week. Discussed risks and side effects with Alice Santiago.    Continue all other maintenance medications.  Follow up plan: Return in about 6 weeks (around 11/27/2023) for Chronic Condition Follow up.   Continue healthy lifestyle choices, including diet (rich in fruits, vegetables, and lean proteins, and low in salt and simple carbohydrates) and exercise (at least 30 minutes of moderate physical activity daily).  Written and verbal instructions provided   The above assessment and management plan was discussed with the Alice Santiago. The Alice Santiago verbalized understanding of and has agreed to the management plan. Alice Santiago is aware to call the clinic if they develop any new symptoms or if symptoms persist or worsen. Alice Santiago is aware when to return to the clinic for a follow-up visit. Alice Santiago educated on when it is appropriate to go to the emergency department.   Marry Kins, DNP-FNP [facility] [address] [place], [place] [address] [phone]

## 2023-10-17 LAB — CMP14+EGFR
ALT: 12 IU/L (ref 0–32)
AST: 17 IU/L (ref 0–40)
Albumin: 4.5 g/dL (ref 3.9–4.9)
Alkaline Phosphatase: 89 IU/L (ref 44–121)
BUN/Creatinine Ratio: 16 (ref 9–23)
BUN: 11 mg/dL (ref 6–24)
Bilirubin Total: 0.3 mg/dL (ref 0.0–1.2)
CO2: 20 mmol/L (ref 20–29)
Calcium: 9 mg/dL (ref 8.7–10.2)
Chloride: 105 mmol/L (ref 96–106)
Creatinine, Ser: 0.69 mg/dL (ref 0.57–1.00)
Globulin, Total: 2.4 g/dL (ref 1.5–4.5)
Glucose: 111 mg/dL — ABNORMAL HIGH (ref 70–99)
Potassium: 4.2 mmol/L (ref 3.5–5.2)
Sodium: 142 mmol/L (ref 134–144)
Total Protein: 6.9 g/dL (ref 6.0–8.5)
eGFR: 110 mL/min/{1.73_m2} (ref >59)

## 2023-10-17 LAB — LIPID PANEL
Cholesterol, Total: 174 mg/dL (ref 100–199)
HDL: 42 mg/dL (ref >39)
LDL CALC COMMENT:: 4.1 ratio (ref 0.0–4.4)
LDL Chol Calc (NIH): 95 mg/dL (ref 0–99)
Triglycerides: 216 mg/dL — ABNORMAL HIGH (ref 0–149)
VLDL Cholesterol Cal: 37 mg/dL (ref 5–40)

## 2023-10-17 LAB — ANEMIA PROFILE B
Basophils Absolute: 0.1 10*3/uL (ref 0.0–0.2)
Basos: 1 %
EOS (ABSOLUTE): 0.1 10*3/uL (ref 0.0–0.4)
Eos: 2 %
Ferritin: 182 ng/mL — ABNORMAL HIGH (ref 15–150)
Folate: 13.9 ng/mL (ref >3.0)
Hematocrit: 43.4 % (ref 34.0–46.6)
Hemoglobin: 14.6 g/dL (ref 11.1–15.9)
Immature Grans (Abs): 0 10*3/uL (ref 0.0–0.1)
Immature Granulocytes: 0 %
Iron Saturation: 38 % (ref 15–55)
Iron: 122 ug/dL (ref 27–159)
Lymphocytes Absolute: 1.9 10*3/uL (ref 0.7–3.1)
Lymphs: 31 %
MCH: 32.2 pg (ref 26.6–33.0)
MCHC: 33.6 g/dL (ref 31.5–35.7)
MCV: 96 fL (ref 79–97)
Monocytes Absolute: 0.5 10*3/uL (ref 0.1–0.9)
Monocytes: 8 %
Neutrophils Absolute: 3.5 10*3/uL (ref 1.4–7.0)
Neutrophils: 58 %
Platelets: 289 10*3/uL (ref 150–450)
RBC: 4.54 x10E6/uL (ref 3.77–5.28)
RDW: 12.7 % (ref 11.7–15.4)
Retic Ct Pct: 1.4 % (ref 0.6–2.6)
Total Iron Binding Capacity: 318 ug/dL (ref 250–450)
UIBC: 196 ug/dL (ref 131–425)
Vitamin B-12: 284 pg/mL (ref 232–1245)
WBC: 6 10*3/uL (ref 3.4–10.8)

## 2023-10-17 LAB — VITAMIN D 25 HYDROXY (VIT D DEFICIENCY, FRACTURES): Vit D, 25-Hydroxy: 26.7 ng/mL — ABNORMAL LOW (ref 30.0–100.0)

## 2023-10-17 LAB — TSH: TSH: 1.73 u[IU]/mL (ref 0.450–4.500)

## 2023-10-19 LAB — BAYER DCA HB A1C WAIVED: HB A1C (BAYER DCA - WAIVED): 6.1 % — ABNORMAL HIGH (ref 4.8–5.6)

## 2023-10-20 ENCOUNTER — Encounter: Payer: Self-pay | Admitting: Family Medicine

## 2023-10-20 MED ORDER — VITAMIN D (ERGOCALCIFEROL) 1.25 MG (50000 UNIT) PO CAPS: 50000.0000 [IU] | ORAL_CAPSULE | ORAL | 0 refills | Status: AC

## 2023-10-20 NOTE — Progress Notes (Signed)
 A1C remains in prediabetes range. Recommend continuing healthy lifestyle modifications. Patient remains vitamin d  insufficient. I have sent in a weekly supplement to take for the next 12 weeks. After that, take a daily OTC vitamin D  supplement with 1000-2000 IU. Cholesterol levels much improved. The 10-year ASCVD risk score (Arnett DK, et al., 2019) is: 3%. Based on risk score, not necessary to start a statin at this time. Can continue diet and lifestyle modifications. Can take red yeast rice and fish oil.  Slight elevation on ferritin, will continue to monitor.

## 2023-10-21 DIAGNOSIS — F3162 Bipolar disorder, current episode mixed, moderate: Secondary | ICD-10-CM | POA: Diagnosis not present

## 2023-10-21 DIAGNOSIS — F4312 Post-traumatic stress disorder, chronic: Secondary | ICD-10-CM | POA: Diagnosis not present

## 2023-10-21 DIAGNOSIS — F411 Generalized anxiety disorder: Secondary | ICD-10-CM | POA: Diagnosis not present

## 2023-10-26 ENCOUNTER — Telehealth: Payer: Self-pay

## 2023-10-26 NOTE — Telephone Encounter (Signed)
Attempted to contact- NA  Need more information

## 2023-10-26 NOTE — Telephone Encounter (Signed)
 Copied from CRM [id number]. Topic: Clinical - Medication Question >> Oct 26, 2023 12:04 PM Antwanette L wrote: Reason for CRM: Patient wants to know how should she record her blood pressure logs for 2/21? Can she send the results through mychart or come in person? Also the patient wants to know if her blood pressure logs are good, will she still needs to see Jerrel Ivory on 3/21? Patient can be contacted through mychart.

## 2023-11-05 ENCOUNTER — Ambulatory Visit: Payer: Medicaid Other | Admitting: Nurse Practitioner

## 2023-11-18 DIAGNOSIS — F411 Generalized anxiety disorder: Secondary | ICD-10-CM | POA: Diagnosis not present

## 2023-11-18 DIAGNOSIS — F4312 Post-traumatic stress disorder, chronic: Secondary | ICD-10-CM | POA: Diagnosis not present

## 2023-11-18 DIAGNOSIS — F3162 Bipolar disorder, current episode mixed, moderate: Secondary | ICD-10-CM | POA: Diagnosis not present

## 2023-11-27 ENCOUNTER — Ambulatory Visit: Payer: Medicaid Other | Admitting: Family Medicine

## 2024-01-01 ENCOUNTER — Ambulatory Visit: Payer: Medicaid Other | Admitting: Nurse Practitioner

## 2024-01-05 ENCOUNTER — Telehealth: Payer: Self-pay | Admitting: Internal Medicine

## 2024-01-05 ENCOUNTER — Other Ambulatory Visit: Payer: Self-pay

## 2024-01-05 MED ORDER — BUDESONIDE-FORMOTEROL FUMARATE 160-4.5 MCG/ACT IN AERO: 2.0000 | INHALATION_SPRAY | Freq: Two times a day (BID) | RESPIRATORY_TRACT | 0 refills | Status: DC

## 2024-01-05 MED ORDER — ALBUTEROL SULFATE (2.5 MG/3ML) 0.083% IN NEBU: 2.5000 mg | INHALATION_SOLUTION | RESPIRATORY_TRACT | 0 refills | Status: DC | PRN

## 2024-01-05 NOTE — Telephone Encounter (Signed)
 Patient called and stated she needed a refill on her Albuterol  Nebulizer Solution and her Symbicort .  Patient stated she wants it sent over to the [facility].    Best Contact: [phone]

## 2024-01-08 ENCOUNTER — Telehealth: Payer: Self-pay

## 2024-01-08 DIAGNOSIS — L989 Disorder of the skin and subcutaneous tissue, unspecified: Secondary | ICD-10-CM | POA: Diagnosis not present

## 2024-01-08 DIAGNOSIS — W57XXXA Bitten or stung by nonvenomous insect and other nonvenomous arthropods, initial encounter: Secondary | ICD-10-CM | POA: Diagnosis not present

## 2024-01-08 NOTE — Telephone Encounter (Signed)
 Copied from CRM [id number]. Topic: Clinical - Medical Advice >> Jan 08, 2024  9:58 AM [name] wrote: Reason for CRM: Patient was bite by a tick on Wednesday night and says the tick had a white spot on the back and now she has a really itchy and it has gotten more itchy in the last 24 hours with her clothes touching it and the area of her bikini line is where it was attached, big red spot Patient would like to ask if the Dr could call her in a prescription to the pharmacy (patient also says if she is prescribed an antibiotic she needs a yeast infection pill also) Patient number [phone] Alice Santiago)  [facility], [place] - [address] [place] [address] Phone: [phone] Fax: [phone]

## 2024-01-13 ENCOUNTER — Ambulatory Visit (INDEPENDENT_AMBULATORY_CARE_PROVIDER_SITE_OTHER): Admitting: Allergy & Immunology

## 2024-01-13 VITALS — BP 130/78 | HR 99 | Temp 98.7°F | Resp 18 | Ht 62.6 in | Wt 199.8 lb

## 2024-01-13 DIAGNOSIS — J4540 Moderate persistent asthma, uncomplicated: Secondary | ICD-10-CM | POA: Diagnosis not present

## 2024-01-13 DIAGNOSIS — J302 Other seasonal allergic rhinitis: Secondary | ICD-10-CM

## 2024-01-13 DIAGNOSIS — J3089 Other allergic rhinitis: Secondary | ICD-10-CM | POA: Diagnosis not present

## 2024-01-13 MED ORDER — OLOPATADINE HCL 0.2 % OP SOLN: 1.0000 [drp] | Freq: Every day | OPHTHALMIC | 5 refills | Status: DC | PRN

## 2024-01-13 MED ORDER — LEVOCETIRIZINE DIHYDROCHLORIDE 5 MG PO TABS: 5.0000 mg | ORAL_TABLET | Freq: Every evening | ORAL | 1 refills | Status: DC

## 2024-01-13 NOTE — Patient Instructions (Addendum)
 Asthma - Lung testing looks decent today. - Daily controller medication(s): Symbicort  160/4.58mcg two puffs once daily - Prior to physical activity: albuterol  2 puffs 10-15 minutes before physical activity. - Rescue medications: albuterol  4 puffs every 4-6 hours as needed and albuterol  nebulizer one vial every 4-6 hours as needed - Changes during respiratory infections or worsening symptoms: Increase Symbicort  to 2 puffs twice daily for TWO WEEKS. - Asthma control goals:  * Full participation in all desired activities (may need albuterol  before activity) * Albuterol  use two time or less a week on average (not counting use with activity) * Cough interfering with sleep two time or less a month * Oral steroids no more than once a year * No hospitalizations  2. Allergic rhinitis (grasses, ragweed, weeds, indoor molds, outdoor molds, dust mites and cat and mixed feathers) - Stop the cetirizine  10 mg and start Xyzal  5mg  daily instead.  - Continue azelastine  2 sprays in each nostril once or twice a day as needed for nasal symptoms Consider saline nasal rinses as needed for nasal symptoms.  - Use this before any medicated nasal sprays for best result - Consider allergen immunotherapy if your symptoms are not well-controlled with the treatment plan as listed above  3. Return in about 6 months (around 07/15/2024). You can have the follow up appointment with Dr. Idolina Maker or a Nurse Practicioner (our Nurse Practitioners are excellent and always have Physician oversight!).    Please inform us  of any Emergency Department visits, hospitalizations, or changes in symptoms. Call us  before going to the ED for breathing or allergy symptoms since we might be able to fit you in for a sick visit. Feel free to contact us  anytime with any questions, problems, or concerns.  It was a pleasure to see you again today!  Websites that have reliable patient information: 1. American Academy of Asthma, Allergy, and  Immunology: www.aaaai.org 2. Food Allergy Research and Education (FARE): foodallergy.org 3. Mothers of Asthmatics: http://www.asthmacommunitynetwork.org 4. American College of Allergy, Asthma, and Immunology: www.acaai.org      "Like" us  on Facebook and Instagram for our latest updates!      A healthy democracy works best when [employer] participate! Make sure you are registered to vote! If you have moved or changed any of your contact information, you will need to get this updated before voting! Scan the QR codes below to learn more!

## 2024-01-13 NOTE — Progress Notes (Unsigned)
 FOLLOW UP  Date of Service/Encounter:  01/13/24   Assessment:   Moderate persistent asthma, uncomplicated    Seasonal and perennial allergic rhinitis (grasses, ragweed, weeds, indoor molds, outdoor molds, dust mites and cat and mixed feathers)  Plan/Recommendations:   Patient Instructions  Asthma - Lung testing looks decent today. - Daily controller medication(s): Symbicort  160/4.53mcg two puffs once daily - Prior to physical activity: albuterol  2 puffs 10-15 minutes before physical activity. - Rescue medications: albuterol  4 puffs every 4-6 hours as needed and albuterol  nebulizer one vial every 4-6 hours as needed - Changes during respiratory infections or worsening symptoms: Increase Symbicort  to 2 puffs twice daily for TWO WEEKS. - Asthma control goals:  * Full participation in all desired activities (may need albuterol  before activity) * Albuterol  use two time or less a week on average (not counting use with activity) * Cough interfering with sleep two time or less a month * Oral steroids no more than once a year * No hospitalizations  2. Allergic rhinitis (grasses, ragweed, weeds, indoor molds, outdoor molds, dust mites and cat and mixed feathers) - Stop the cetirizine  10 mg and start Xyzal  5mg  daily instead.  - Continue azelastine  2 sprays in each nostril once or twice a day as needed for nasal symptoms Consider saline nasal rinses as needed for nasal symptoms.  - Use this before any medicated nasal sprays for best result - Consider allergen immunotherapy if your symptoms are not well-controlled with the treatment plan as listed above  3. Return in about 6 months (around 07/15/2024). You can have the follow up appointment with Dr. Idolina Maker or a Nurse Practicioner (our Nurse Practitioners are excellent and always have Physician oversight!).    Please inform us  of any Emergency Department visits, hospitalizations, or changes in symptoms. Call us  before going to the ED for  breathing or allergy symptoms since we might be able to fit you in for a sick visit. Feel free to contact us  anytime with any questions, problems, or concerns.  It was a pleasure to see you again today!  Websites that have reliable patient information: 1. American Academy of Asthma, Allergy, and Immunology: www.aaaai.org 2. Food Allergy Research and Education (FARE): foodallergy.org 3. Mothers of Asthmatics: http://www.asthmacommunitynetwork.org 4. American College of Allergy, Asthma, and Immunology: www.acaai.org      "Like" us  on Facebook and Instagram for our latest updates!      A healthy democracy works best when [employer] participate! Make sure you are registered to vote! If you have moved or changed any of your contact information, you will need to get this updated before voting! Scan the QR codes below to learn more!        Subjective:   Alice Santiago is a 43 y.o. 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/16/2023   Substance abuse (HCC) 10/16/2023   Depression, recurrent (HCC) 10/07/2023   Primary hypertension 10/07/2023   Nicotine dependence 04/08/2023   Cardiac arrhythmia 01/30/2023   Chronic nonintractable headache 01/30/2023   Mixed hyperlipidemia 04/03/2021  Chronic bilateral low back pain without sciatica 08/13/2020   Difficulty sleeping 02/09/2020   Overactive bladder 11/22/2019   Stress incontinence, female 05/18/2019   GAD (generalized anxiety disorder) 05/20/2018   History of ovarian cyst 03/14/2018   Cervical vertebral fusion 10/08/2017   Seasonal and perennial  allergic rhinitis 12/02/2016   Moderate persistent asthma 12/02/2016   S/P lumbar laminectomy 11/10/2014   Degenerative disc disease, lumbar 11/22/2013   Mood disorder (HCC) 07/26/2013    History obtained from: chart review and {Persons; PED relatives w/patient:19415::"patient"}.  Discussed the use of AI scribe software for clinical note transcription with the patient and/or guardian, who gave verbal consent to proceed.  Alice is a 43 y.o. female presenting for {Blank single:19197::"a food challenge","a drug challenge","skin testing","a sick visit","an evaluation of ***","a follow up visit"}.  Since last seen in July 2024.  At that time, he was continued on Symbicort  but was increased to 2 puffs twice a day.  For her allergic rhinitis, she was continued on cetirizine  as well as Astelin .  Allergy shots were discussed for long-term control.  Since last visit,  Asthma/Respiratory Symptom History: ***  Allergic Rhinitis Symptom History: ***  Food Allergy Symptom History: ***  Skin Symptom History: ***  GERD Symptom History: ***  Infection Symptom History: ***  Otherwise, there have been no changes to her past medical history, surgical history, family history, or social history.    Review of systems otherwise negative other than that mentioned in the HPI.    Objective:   There were no vitals taken for this visit. There is no height or weight on file to calculate BMI.    Physical Exam   Diagnostic studies:    Spirometry: results normal (FEV1: 2.24/79%, FVC: 2.87/83%, FEV1/FVC: 78%).    Spirometry consistent with normal pattern.   Allergy Studies: {Blank single:19197::"none","deferred due to recent antihistamine use","deferred due to insurance stipulations that require a separate visit for testing","labs sent instead"," "}    {Blank single:19197::"Allergy testing results were read and interpreted by myself, documented by clinical staff."," "}      Drexel Gentles,  MD  [facility] 

## 2024-01-14 ENCOUNTER — Ambulatory Visit: Payer: Medicaid Other | Admitting: Family Medicine

## 2024-01-14 ENCOUNTER — Encounter: Payer: Self-pay | Admitting: Allergy & Immunology

## 2024-01-14 ENCOUNTER — Encounter: Payer: Self-pay | Admitting: Family Medicine

## 2024-01-14 VITALS — BP 107/72 | HR 69 | Temp 98.5°F | Ht 62.0 in | Wt 199.0 lb

## 2024-01-14 DIAGNOSIS — F411 Generalized anxiety disorder: Secondary | ICD-10-CM

## 2024-01-14 DIAGNOSIS — R7303 Prediabetes: Secondary | ICD-10-CM

## 2024-01-14 DIAGNOSIS — R002 Palpitations: Secondary | ICD-10-CM

## 2024-01-14 DIAGNOSIS — F339 Major depressive disorder, recurrent, unspecified: Secondary | ICD-10-CM

## 2024-01-14 DIAGNOSIS — I10 Essential (primary) hypertension: Secondary | ICD-10-CM

## 2024-01-14 DIAGNOSIS — F17201 Nicotine dependence, unspecified, in remission: Secondary | ICD-10-CM

## 2024-01-14 DIAGNOSIS — E782 Mixed hyperlipidemia: Secondary | ICD-10-CM | POA: Diagnosis not present

## 2024-01-14 DIAGNOSIS — F1910 Other psychoactive substance abuse, uncomplicated: Secondary | ICD-10-CM

## 2024-01-14 DIAGNOSIS — E559 Vitamin D deficiency, unspecified: Secondary | ICD-10-CM | POA: Diagnosis not present

## 2024-01-14 DIAGNOSIS — J4540 Moderate persistent asthma, uncomplicated: Secondary | ICD-10-CM

## 2024-01-14 DIAGNOSIS — W57XXXD Bitten or stung by nonvenomous insect and other nonvenomous arthropods, subsequent encounter: Secondary | ICD-10-CM

## 2024-01-14 DIAGNOSIS — S30860D Insect bite (nonvenomous) of lower back and pelvis, subsequent encounter: Secondary | ICD-10-CM

## 2024-01-14 LAB — LIPID PANEL

## 2024-01-14 LAB — BAYER DCA HB A1C WAIVED: HB A1C (BAYER DCA - WAIVED): 5.6 % (ref 4.8–5.6)

## 2024-01-14 MED ORDER — DOXYCYCLINE HYCLATE 100 MG PO TABS
100.0000 mg | ORAL_TABLET | Freq: Two times a day (BID) | ORAL | 0 refills | Status: AC
Start: 2024-01-14 — End: 2024-01-24

## 2024-01-14 MED ORDER — FLUCONAZOLE 150 MG PO TABS: 150.0000 mg | ORAL_TABLET | Freq: Once | ORAL | 0 refills | Status: AC

## 2024-01-14 NOTE — Addendum Note (Signed)
 Addended by: Jacqualyn Mates on: 01/14/2024 12:32 PM   Modules accepted: Orders

## 2024-01-14 NOTE — Progress Notes (Addendum)
 Subjective:  Patient ID: Alice Santiago, female    DOB: [birth year], 43 y.o.   MRN: [id number]  Patient Care Team: Chrystine Crate, FNP as PCP - General (Family Medicine) Gerard Knight, MD as PCP - Cardiology (Cardiology)   Chief Complaint:  Medical Management of Chronic Issues   HPI: Alice M Wrobleski is a 43 y.o. female presenting on 01/14/2024 for Medical Management of Chronic Issues  HPI 1. Primary hypertension Has BP monitor at home Yes BP at home average 115-120s/80s ROS Denies peripheral edema, chest pain, headaches, palpitations, sweats, SOB, PND, orthopnea CAD risks hypercholesterolemia/hyperlipidemia Lifestyle controlled   2. Depression, recurrent (HCC)/ 3. GAD (generalized anxiety disorder) She is established with [facility] [place]. She is doing well. Denies SI. She is doing some counseling with her, but not formal therapist.   4. Mixed hyperlipidemia/5. Prediabetes She is taking krill oil.  She is exercising 3-4 times per week usually a workout video that she finds on facebook. She is eating a little red meat on occasion. She is eating more sweets than she was previously. She is not drinking any caffeine.   6. Vitamin D  deficiency Completed prescription supplement last week and started daily last week. She denies any fractures. Denies numbness and tingling.   7. Moderate persistent asthma without complication She is established with pulmonologist. She is doing well with breathing. States that this past weekend she had to push her father around the hospital in [place] and was able to walk and breathe without giving out of breath.   8. Palpitations Denies any palpitations, notices that sometimes her HR goes up.   9. Tobacco use disorder, moderate, in early remission It has been ~4 months since she has quit smoking cigarettes. She is still smoking marijuana. She reports that she is smoking every now and then.   10. Tick bite  She also has a tick  bite on left upper thigh/pelvic region that is still red and inflamed after taking prophylactic doxycycline. States that it is slightly improved but not fully.   Relevant past medical, surgical, family, and social history reviewed and updated as indicated.  Allergies and medications reviewed and updated. Data reviewed: Chart in Epic.   Past Medical History:  Diagnosis Date   Anxiety    Asthma    Back problem    Breast tenderness in female 04/23/2015   Bronchitis    BV (bacterial vaginosis) 03/14/2015   Depression    History of chlamydia 04/23/2015   Mixed hyperlipidemia 04/03/2021   Vaginal Pap smear, abnormal     Past Surgical History:  Procedure Laterality Date   ANTERIOR CERVICAL DECOMP/DISCECTOMY FUSION  10/08/2017   Procedure: Anterior Cervical Decompression/Discectomy Fusion - Cervical five-Cervical six;  Surgeon: Isadora Mar, MD;  Location[facility];  Service: Neurosurgery;;   CESAREAN SECTION     times 2   LUMBAR LAMINECTOMY/DECOMPRESSION MICRODISCECTOMY Left 11/10/2014   Procedure: LUMBAR LAMINECTOMY/DECOMPRESSION MICRODISCECTOMY LEFT LUMBAR FIVE-SACRAL ONE;  Surgeon: Isadora Mar, MD;  Location: [facility] OR[facility];  Service: Neurosurgery;  Laterality: Left;  left   TONSILLECTOMY     TOTAL ABDOMINAL HYSTERECTOMY     TUBAL LIGATION      Social History   Socioeconomic History   Marital status: Single    Spouse name: Not on file   Number of children: 2   Years of education: Not on file   Highest education level: Not on file  Occupational History   Not on file  Tobacco  Use   Smoking status: Every Day    Current packs/day: 0.50    Average packs/day: 0.5 packs/day for 21.0 years (10.5 ttl pk-yrs)    Types: Cigarettes   Smokeless tobacco: Never   Tobacco comments:    Trying to cut back   Vaping Use   Vaping status: Former  Substance and Sexual Activity   Alcohol use: Not Currently    Comment: occ   Drug use: Yes    Types: Marijuana   Sexual activity: Not  Currently    Birth control/protection: Surgical    Comment: hyst  Other Topics Concern   Not on file  Social History Narrative   Not on file   Social Drivers of Health   Financial Resource Strain: Not on file  Food Insecurity: No Food Insecurity (01/14/2024)   Hunger Vital Sign    Worried About Running Out of Food in the Last Year: Never true    Ran Out of Food in the Last Year: Never true  Transportation Needs: No Transportation Needs (01/14/2024)   PRAPARE - Administrator, Civil Service (Medical): No    Lack of Transportation (Non-Medical): No  Physical Activity: Not on file  Stress: Not on file  Social Connections: Not on file  Intimate Partner Violence: Not At Risk (01/14/2024)   Humiliation, Afraid, Rape, and Kick questionnaire    Fear of Current or Ex-Partner: No    Emotionally Abused: No    Physically Abused: No    Sexually Abused: No    Outpatient Encounter Medications as of 01/14/2024  Medication Sig   albuterol  (PROVENTIL ) (2.5 MG/3ML) 0.083% nebulizer solution Take 3 mLs (2.5 mg total) by nebulization every 4 (four) hours as needed for wheezing or shortness of breath.   ALPRAZolam  (XANAX ) 0.5 MG tablet Take 0.5 mg by mouth 2 (two) times daily as needed.   Blood Pressure Monitoring (B-D ASSURE BPM/AUTO ARM CUFF) MISC Use as directed   budesonide -formoterol  (SYMBICORT ) 160-4.5 MCG/ACT inhaler Inhale 2 puffs into the lungs 2 (two) times daily.   Cholecalciferol (VITAMIN D3) 25 MCG CAPS Take 1 tablet by mouth daily in the afternoon.   KRILL OIL PO Take 1 tablet by mouth daily in the afternoon.   levalbuterol  (XOPENEX  HFA) 45 MCG/ACT inhaler Inhale 2 puffs into the lungs every 4 (four) hours as needed for wheezing.   levocetirizine (XYZAL ) 5 MG tablet Take 1 tablet (5 mg total) by mouth every evening.   Olopatadine  HCl (PATADAY ) 0.2 % SOLN Place 1 drop into both eyes daily as needed.   ondansetron  (ZOFRAN ) 4 MG tablet Take 1 tablet (4 mg total) by mouth every 8  (eight) hours as needed for nausea or vomiting.   RESTASIS 0.05 % ophthalmic emulsion 1 drop 2 (two) times daily.   [DISCONTINUED] azelastine  (ASTELIN ) 0.1 % nasal spray Place 1 spray into both nostrils 2 (two) times daily as needed for rhinitis. Use in each nostril as directed (Patient not taking: Reported on 01/13/2024)   [DISCONTINUED] cetirizine  (ZYRTEC ) 10 MG tablet Take 10 mg by mouth as needed for allergies. (Patient not taking: Reported on 01/13/2024)   No facility-administered encounter medications on file as of 01/14/2024.    Allergies  Allergen Reactions   Metoprolol  Other (See Comments)    Anxiety, insomnia, tachycardia, night sweats     Review of Systems As per HPI  Objective:  BP 107/72   Pulse 69   Temp 98.5 F (36.9 C)   Ht 5\' 2"  (1.575 m)  Wt 199 lb (90.3 kg)   SpO2 98%   BMI 36.40 kg/m    Wt Readings from Last 3 Encounters:  01/13/24 199 lb 12.8 oz (90.6 kg)  10/16/23 197 lb (89.4 kg)  07/02/23 204 lb 12.8 oz (92.9 kg)   Physical Exam Constitutional:      General: She is awake. She is not in acute distress.    Appearance: Normal appearance. She is well-developed and well-groomed. She is obese. She is not ill-appearing, toxic-appearing or diaphoretic.  Cardiovascular:     Rate and Rhythm: Normal rate and regular rhythm.     Pulses: Normal pulses.          Radial pulses are 2+ on the right side and 2+ on the left side.       Posterior tibial pulses are 2+ on the right side and 2+ on the left side.     Heart sounds: Normal heart sounds. No murmur heard.    No gallop.  Pulmonary:     Effort: Pulmonary effort is normal. No respiratory distress.     Breath sounds: No stridor. Examination of the right-lower field reveals decreased breath sounds. Examination of the left-lower field reveals decreased breath sounds. Decreased breath sounds present. No wheezing, rhonchi or rales.     Comments: Course breath sounds in bases  Musculoskeletal:     Cervical back: Full  passive range of motion without pain and neck supple.     Right lower leg: No edema.     Left lower leg: No edema.  Skin:    General: Skin is warm.     Capillary Refill: Capillary refill takes less than 2 seconds.          Comments: ~2inch erythematous circular, raised area from tick bite   Neurological:     General: No focal deficit present.     Mental Status: She is alert, oriented to person, place, and time and easily aroused. Mental status is at baseline.     GCS: GCS eye subscore is 4. GCS verbal subscore is 5. GCS motor subscore is 6.     Motor: No weakness.  Psychiatric:        Attention and Perception: Attention and perception normal.        Mood and Affect: Mood and affect normal.        Speech: Speech normal.        Behavior: Behavior normal. Behavior is cooperative.        Thought Content: Thought content normal. Thought content does not include homicidal or suicidal ideation. Thought content does not include homicidal or suicidal plan.        Cognition and Memory: Cognition and memory normal.        Judgment: Judgment normal.      Results for orders placed or performed in visit on 10/16/23  Bayer DCA Hb A1c Waived   Collection Time: 10/16/23  9:08 AM  Result Value Ref Range   HB A1C (BAYER DCA - WAIVED) 6.1 (H) 4.8 - 5.6 %  Anemia Profile B   Collection Time: 10/16/23  9:10 AM  Result Value Ref Range   Total Iron Binding Capacity 318 250 - 450 ug/dL   UIBC 540 981 - 191 ug/dL   Iron 478 27 - 295 ug/dL   Iron Saturation 38 15 - 55 %   Ferritin 182 (H) 15 - 150 ng/mL   Vitamin B-12 284 232 - 1,245 pg/mL   Folate 13.9 >3.0 ng/mL  WBC 6.0 3.4 - 10.8 x10E3/uL   RBC 4.54 3.77 - 5.28 x10E6/uL   Hemoglobin 14.6 11.1 - 15.9 g/dL   Hematocrit 09.8 11.9 - 46.6 %   MCV 96 79 - 97 fL   MCH 32.2 26.6 - 33.0 pg   MCHC 33.6 31.5 - 35.7 g/dL   RDW 14.7 82.9 - 56.2 %   Platelets 289 150 - 450 x10E3/uL   Neutrophils 58 Not Estab. %   Lymphs 31 Not Estab. %   Monocytes 8  Not Estab. %   Eos 2 Not Estab. %   Basos 1 Not Estab. %   Neutrophils Absolute 3.5 1.4 - 7.0 x10E3/uL   Lymphocytes Absolute 1.9 0.7 - 3.1 x10E3/uL   Monocytes Absolute 0.5 0.1 - 0.9 x10E3/uL   EOS (ABSOLUTE) 0.1 0.0 - 0.4 x10E3/uL   Basophils Absolute 0.1 0.0 - 0.2 x10E3/uL   Immature Granulocytes 0 Not Estab. %   Immature Grans (Abs) 0.0 0.0 - 0.1 x10E3/uL   Retic Ct Pct 1.4 0.6 - 2.6 %  CMP14+EGFR   Collection Time: 10/16/23  9:10 AM  Result Value Ref Range   Glucose 111 (H) 70 - 99 mg/dL   BUN 11 6 - 24 mg/dL   Creatinine, Ser 1.30 0.57 - 1.00 mg/dL   eGFR 865 >78 IO/NGE/9.52   BUN/Creatinine Ratio 16 9 - 23   Sodium 142 134 - 144 mmol/L   Potassium 4.2 3.5 - 5.2 mmol/L   Chloride 105 96 - 106 mmol/L   CO2 20 20 - 29 mmol/L   Calcium 9.0 8.7 - 10.2 mg/dL   Total Protein 6.9 6.0 - 8.5 g/dL   Albumin 4.5 3.9 - 4.9 g/dL   Globulin, Total 2.4 1.5 - 4.5 g/dL   Bilirubin Total 0.3 0.0 - 1.2 mg/dL   Alkaline Phosphatase 89 44 - 121 IU/L   AST 17 0 - 40 IU/L   ALT 12 0 - 32 IU/L  Lipid panel   Collection Time: 10/16/23  9:10 AM  Result Value Ref Range   Cholesterol, Total 174 100 - 199 mg/dL   Triglycerides 841 (H) 0 - 149 mg/dL   HDL 42 >32 mg/dL   VLDL Cholesterol Cal 37 5 - 40 mg/dL   LDL Chol Calc (NIH) 95 0 - 99 mg/dL   Chol/HDL Ratio 4.1 0.0 - 4.4 ratio  VITAMIN D  25 Hydroxy (Vit-D Deficiency, Fractures)   Collection Time: 10/16/23  9:10 AM  Result Value Ref Range   Vit D, 25-Hydroxy 26.7 (L) 30.0 - 100.0 ng/mL  TSH   Collection Time: 10/16/23  9:10 AM  Result Value Ref Range   TSH 1.730 0.450 - 4.500 uIU/mL       01/14/2024    9:21 AM 10/16/2023    9:41 AM 01/30/2023   11:45 AM 12/31/2022    3:27 PM 10/01/2021    3:25 PM  Depression screen PHQ 2/9  Decreased Interest 1 1 2 1  0  Down, Depressed, Hopeless 1  2 1  0  PHQ - 2 Score 2 1 4 2  0  Altered sleeping 2 2 2 1    Tired, decreased energy 1 1 2 1    Change in appetite 1 1 2 1    Feeling bad or failure about  yourself  0 0 0 0   Trouble concentrating 1  2 0   Moving slowly or fidgety/restless 0 0 0 1   Suicidal thoughts 0 0 0    PHQ-9 Score 7 5 12  6  01/14/2024    9:22 AM 10/16/2023    9:41 AM 01/30/2023   11:45 AM 12/31/2022    3:28 PM  GAD 7 : Generalized Anxiety Score  Nervous, Anxious, on Edge 2 1 0 0  Control/stop worrying 2  0 0  Worry too much - different things 2  2 1   Trouble relaxing 1 1 0 0  Restless 0 0  0  Easily annoyed or irritable 1 1 1  0  Afraid - awful might happen 0 0 0 0  Total GAD 7 Score 8   1   Pertinent labs & imaging results that were available during my care of the patient were reviewed by me and considered in my medical decision making.  Assessment & Plan:  Alice was seen today for medical management of chronic issues.  Diagnoses and all orders for this visit: 1. Primary hypertension (Primary) Well controlled. Continue current regimen.  Can continue to monitor BP at home.  - CMP14+EGFR - CBC with Differential/Platelet - For home use only DME Other see comment  2. Depression, recurrent (HCC) Well controlled. Continue current regimen.  Denies SI. Safety contract established. Follow up with specialty.   3. GAD (generalized anxiety disorder) As above.   4. Mixed hyperlipidemia Labs as below. Will communicate results to patient once available. Will await results to determine next steps.  - Lipid panel  5. Prediabetes Labs as below. Will communicate results to patient once available. Will await results to determine next steps.  - Bayer DCA Hb A1c Waived  6. Vitamin D  deficiency Labs as below. Will communicate results to patient once available. Will await results to determine next steps.  - VITAMIN D  25 Hydroxy (Vit-D Deficiency, Fractures)  7. Moderate persistent asthma without complication Reviewed notes from Idolina Maker, MD 01/13/24. Recommend patient continue to follow up with specialty   8. Palpitations Resolved.   9. Tobacco use disorder,  moderate, in early remission Praised patient on continuing to abstain from cigarettes.   10. Tick bite of pelvic region, subsequent encounter Will start medication as below given continued site irritation and concern for erythema migrans. Will send in diflucan  as well for patient history of yeast infections from antibiotics. Discussed red flag symptoms and when to follow up.  - doxycycline (VIBRA-TABS) 100 MG tablet; Take 1 tablet (100 mg total) by mouth 2 (two) times daily for 10 days.  Dispense: 20 tablet; Refill: 0 - fluconazole  (DIFLUCAN ) 150 MG tablet; Take 1 tablet (150 mg total) by mouth once for 1 dose. May take second dose 72 hours after first dose if symptoms remain  Dispense: 2 tablet; Refill: 0  11. Substance abuse (HCC) Continues to smoke marijuana. Recommended cessation.   Continue all other maintenance medications.  Follow up plan: Return in about 6 months (around 07/16/2024) for Chronic Condition Follow up.   Continue healthy lifestyle choices, including diet (rich in fruits, vegetables, and lean proteins, and low in salt and simple carbohydrates) and exercise (at least 30 minutes of moderate physical activity daily).  Written and verbal instructions provided   The above assessment and management plan was discussed with the patient. The patient verbalized understanding of and has agreed to the management plan. Patient is aware to call the clinic if they develop any new symptoms or if symptoms persist or worsen. Patient is aware when to return to the clinic for a follow-up visit. Patient educated on when it is appropriate to go to the emergency department.   Jacqualyn Mates, DNP-FNP [facility] [address] [place], [place] [address] [phone]

## 2024-01-14 NOTE — Patient Instructions (Signed)
 Resources to check out for diet and exercise  E2M - Eager to Motivate  P:E diet  Mediterranean Diet  Move with Peterson Brandt  Yoga with Jamse Mcgee with Nancyann Aye

## 2024-01-15 ENCOUNTER — Encounter: Payer: Self-pay | Admitting: Family Medicine

## 2024-01-15 LAB — CBC WITH DIFFERENTIAL/PLATELET
Basophils Absolute: 0.1 10*3/uL (ref 0.0–0.2)
Basos: 1 %
EOS (ABSOLUTE): 0.1 10*3/uL (ref 0.0–0.4)
Eos: 2 %
Hematocrit: 41.4 % (ref 34.0–46.6)
Hemoglobin: 13.8 g/dL (ref 11.1–15.9)
Immature Grans (Abs): 0 10*3/uL (ref 0.0–0.1)
Immature Granulocytes: 0 %
Lymphocytes Absolute: 2.1 10*3/uL (ref 0.7–3.1)
Lymphs: 37 %
MCH: 31.7 pg (ref 26.6–33.0)
MCHC: 33.3 g/dL (ref 31.5–35.7)
MCV: 95 fL (ref 79–97)
Monocytes Absolute: 0.6 10*3/uL (ref 0.1–0.9)
Monocytes: 10 %
Neutrophils Absolute: 2.8 10*3/uL (ref 1.4–7.0)
Neutrophils: 50 %
Platelets: 302 10*3/uL (ref 150–450)
RBC: 4.36 x10E6/uL (ref 3.77–5.28)
RDW: 11.8 % (ref 11.7–15.4)
WBC: 5.7 10*3/uL (ref 3.4–10.8)

## 2024-01-15 LAB — CMP14+EGFR
ALT: 12 IU/L (ref 0–32)
AST: 20 IU/L (ref 0–40)
Albumin: 4.3 g/dL (ref 3.9–4.9)
Alkaline Phosphatase: 84 IU/L (ref 44–121)
BUN/Creatinine Ratio: 18 (ref 9–23)
BUN: 13 mg/dL (ref 6–24)
Bilirubin Total: 0.3 mg/dL (ref 0.0–1.2)
CO2: 23 mmol/L (ref 20–29)
Calcium: 9.1 mg/dL (ref 8.7–10.2)
Chloride: 106 mmol/L (ref 96–106)
Creatinine, Ser: 0.71 mg/dL (ref 0.57–1.00)
Globulin, Total: 2.2 g/dL (ref 1.5–4.5)
Glucose: 109 mg/dL — ABNORMAL HIGH (ref 70–99)
Potassium: 4.6 mmol/L (ref 3.5–5.2)
Sodium: 143 mmol/L (ref 134–144)
Total Protein: 6.5 g/dL (ref 6.0–8.5)
eGFR: 108 mL/min/{1.73_m2} (ref >59)

## 2024-01-15 LAB — LIPID PANEL
Chol/HDL Ratio: 4.2 ratio (ref 0.0–4.4)
Cholesterol, Total: 160 mg/dL (ref 100–199)
HDL: 38 mg/dL — ABNORMAL LOW (ref >39)
LDL Chol Calc (NIH): 99 mg/dL (ref 0–99)
Triglycerides: 126 mg/dL (ref 0–149)
VLDL Cholesterol Cal: 23 mg/dL (ref 5–40)

## 2024-01-15 LAB — VITAMIN D 25 HYDROXY (VIT D DEFICIENCY, FRACTURES): Vit D, 25-Hydroxy: 41.2 ng/mL (ref 30.0–100.0)

## 2024-01-15 NOTE — Progress Notes (Signed)
 A1C very well controlled.  Cholesterol improved! Continue diet and lifestyle modifications. Can continue fish oil and can add red yeast rice if she would like.  The 10-year ASCVD risk score (Arnett DK, et al., 2019) is: 2.3%

## 2024-02-18 DIAGNOSIS — F4312 Post-traumatic stress disorder, chronic: Secondary | ICD-10-CM | POA: Diagnosis not present

## 2024-02-18 DIAGNOSIS — F411 Generalized anxiety disorder: Secondary | ICD-10-CM | POA: Diagnosis not present

## 2024-02-18 DIAGNOSIS — F3162 Bipolar disorder, current episode mixed, moderate: Secondary | ICD-10-CM | POA: Diagnosis not present

## 2024-02-18 DIAGNOSIS — Z79899 Other long term (current) drug therapy: Secondary | ICD-10-CM | POA: Diagnosis not present

## 2024-02-22 ENCOUNTER — Telehealth: Payer: Self-pay | Admitting: Family Medicine

## 2024-02-22 NOTE — Telephone Encounter (Signed)
 Letter printed and mailed, called pt and lmtcb.

## 2024-02-22 NOTE — Telephone Encounter (Unsigned)
 Copied from CRM [id number]. Topic: General - Other >> Feb 22, 2024  2:50 PM Alice Santiago wrote: Reason for CRM: patient is needing a formal letter from the provider stating she is taking/buying over the counter medication omega 3 creole oil for her cholesterol, she mention vitamin D3 as well. This letter will be sent to the patient mailing address on file because its for [place] housing [employer]

## 2024-02-24 NOTE — Telephone Encounter (Signed)
Spoke to patient. She is aware.

## 2024-03-20 DIAGNOSIS — R350 Frequency of micturition: Secondary | ICD-10-CM | POA: Diagnosis not present

## 2024-03-20 DIAGNOSIS — N898 Other specified noninflammatory disorders of vagina: Secondary | ICD-10-CM | POA: Diagnosis not present

## 2024-03-20 DIAGNOSIS — R102 Pelvic and perineal pain: Secondary | ICD-10-CM | POA: Diagnosis not present

## 2024-03-21 ENCOUNTER — Telehealth: Payer: Self-pay | Admitting: Allergy & Immunology

## 2024-03-21 NOTE — Telephone Encounter (Signed)
 Pt called and stated she needs a refill on her inhaler.

## 2024-03-23 ENCOUNTER — Other Ambulatory Visit: Payer: Self-pay

## 2024-03-23 MED ORDER — BUDESONIDE-FORMOTEROL FUMARATE 160-4.5 MCG/ACT IN AERO: 2.0000 | INHALATION_SPRAY | Freq: Two times a day (BID) | RESPIRATORY_TRACT | 0 refills | Status: DC

## 2024-03-23 NOTE — Telephone Encounter (Signed)
 Sent to pharmacy. [facility].

## 2024-04-12 ENCOUNTER — Ambulatory Visit: Admitting: Nurse Practitioner

## 2024-04-12 ENCOUNTER — Encounter: Payer: Self-pay | Admitting: Family Medicine

## 2024-04-19 ENCOUNTER — Ambulatory Visit: Admitting: Nurse Practitioner

## 2024-04-19 ENCOUNTER — Telehealth (INDEPENDENT_AMBULATORY_CARE_PROVIDER_SITE_OTHER): Admitting: Family Medicine

## 2024-04-19 ENCOUNTER — Encounter: Payer: Self-pay | Admitting: Family Medicine

## 2024-04-19 DIAGNOSIS — R300 Dysuria: Secondary | ICD-10-CM | POA: Diagnosis not present

## 2024-04-19 DIAGNOSIS — N760 Acute vaginitis: Secondary | ICD-10-CM | POA: Diagnosis not present

## 2024-04-19 LAB — URINALYSIS, COMPLETE
Bilirubin, UA: NEGATIVE
Glucose, UA: NEGATIVE
Ketones, UA: NEGATIVE
Leukocytes,UA: NEGATIVE
Nitrite, UA: NEGATIVE
Protein,UA: NEGATIVE
RBC, UA: NEGATIVE
Specific Gravity, UA: 1.02 (ref 1.005–1.030)
Urobilinogen, Ur: 0.2 mg/dL (ref 0.2–1.0)
pH, UA: 7 (ref 5.0–7.5)

## 2024-04-19 LAB — MICROSCOPIC EXAMINATION

## 2024-04-19 LAB — WET PREP FOR TRICH, YEAST, CLUE
Clue Cell Exam: NEGATIVE
Trichomonas Exam: NEGATIVE
Yeast Exam: NEGATIVE

## 2024-04-19 MED ORDER — METRONIDAZOLE 500 MG PO TABS: 500.0000 mg | ORAL_TABLET | Freq: Two times a day (BID) | ORAL | 0 refills | Status: DC

## 2024-04-19 NOTE — Progress Notes (Signed)
 Subjective:    Patient ID: Alice M Conteh, female    DOB: [birth year], 43 y.o.   MRN: [id number]   HPI:  History of Present Illness   Alice Santiago is a 43 year old female who presents with persistent symptoms of bacterial vaginosis.  She reports that a couple of weeks ago, after visiting an urgent care due to not feeling well, she was told she tested positive for bacterial vaginosis. A urine test was performed and returned normal, but a swab test was positive for bacterial vaginosis. She was prescribed a seven-day course of metronidazole, which she completed around March 31, 2024.  Despite completing the antibiotic course, she continues to experience a strong-smelling urine and occasional irritation and itchiness in the vaginal area. No current vaginal discharge or burning sensation during urination. Her frequency of urination has remained consistent with her usual pattern.  She is concerned about the recurrence of bacterial vaginosis, noting that she is not sexually active and questioning the source of the infection. She inquires about proper hygiene practices, specifically whether her bathing routine could be contributing to the infection.           01/14/2024    9:21 AM 10/16/2023    9:41 AM 01/30/2023   11:45 AM 12/31/2022    3:27 PM 10/01/2021    3:25 PM  Depression screen PHQ 2/9  Decreased Interest 1 1 2 1  0  Down, Depressed, Hopeless 1  2 1  0  PHQ - 2 Score 2 1 4 2  0  Altered sleeping 2 2 2 1    Tired, decreased energy 1 1 2 1    Change in appetite 1 1 2 1    Feeling bad or failure about yourself  0 0 0 0   Trouble concentrating 1  2 0   Moving slowly or fidgety/restless 0 0 0 1   Suicidal thoughts 0 0 0    PHQ-9 Score 7 5 12 6       Relevant past medical, surgical, family and social history reviewed and updated as indicated.  Interim medical history since our last visit reviewed. Allergies and medications reviewed and updated.  ROS:  Review of Systems   Social  History   Tobacco Use  Smoking Status Every Day   Current packs/day: 0.50   Average packs/day: 0.5 packs/day for 21.0 years (10.5 ttl pk-yrs)   Types: Cigarettes  Smokeless Tobacco Never  Tobacco Comments   Trying to cut back        Objective:     Wt Readings from Last 3 Encounters:  01/14/24 199 lb (90.3 kg)  01/13/24 199 lb 12.8 oz (90.6 kg)  10/16/23 197 lb (89.4 kg)     Exam deferred. Video visit performed.   Assessment & Plan:  Dysuria -     Urinalysis, Complete; Future -     Microscopic Examination  Acute vaginitis -     WET PREP FOR TRICH, YEAST, CLUE -     metroNIDAZOLE; Take 1 tablet (500 mg total) by mouth 2 (two) times daily.  Dispense: 14 tablet; Refill: 0     Assessment and Plan    Bacterial vaginosis Recurrent bacterial vaginosis with persistent symptoms. Previous metronidazole treatment completed three weeks ago. Infection resistant, may require multiple treatments. Discussed non-sexual transmission and environmental factors. - Prescribed metronidazole (Flagyl) to be filled at [facility].       Diagnoses and all orders for this visit:  Dysuria -     Urinalysis, Complete; Future -  Urinalysis, Complete -     Microscopic Examination  Acute vaginitis -     WET PREP FOR TRICH, YEAST, CLUE -     metroNIDAZOLE  (FLAGYL ) 500 MG tablet; Take 1 tablet (500 mg total) by mouth 2 (two) times daily.    Virtual Visit  Note  I discussed the limitations, risks, security and privacy concerns of performing an evaluation and management service by video and the availability of in person appointments. The patient was identified with two identifiers. Pt.expressed understanding and agreed to proceed. Pt. Is at home. Dr. Zollie is in his office.  Follow Up Instructions:   I discussed the assessment and treatment plan with the patient. The patient was provided an opportunity to ask questions and all were answered. The patient agreed with the plan and demonstrated  an understanding of the instructions.   The patient was advised to call back or seek an in-person evaluation if the symptoms worsen or if the condition fails to improve as anticipated.      Follow up plan: Return if symptoms worsen or fail to improve.  Butler Zollie, MD [facility]

## 2024-05-02 ENCOUNTER — Other Ambulatory Visit: Payer: Self-pay

## 2024-05-02 ENCOUNTER — Other Ambulatory Visit: Payer: Self-pay | Admitting: Family Medicine

## 2024-05-02 DIAGNOSIS — Z1231 Encounter for screening mammogram for malignant neoplasm of breast: Secondary | ICD-10-CM

## 2024-05-16 ENCOUNTER — Other Ambulatory Visit: Payer: Self-pay | Admitting: Allergy & Immunology

## 2024-05-19 DIAGNOSIS — F3162 Bipolar disorder, current episode mixed, moderate: Secondary | ICD-10-CM | POA: Diagnosis not present

## 2024-05-19 DIAGNOSIS — F411 Generalized anxiety disorder: Secondary | ICD-10-CM | POA: Diagnosis not present

## 2024-05-19 DIAGNOSIS — F4312 Post-traumatic stress disorder, chronic: Secondary | ICD-10-CM | POA: Diagnosis not present

## 2024-05-19 DIAGNOSIS — Z79899 Other long term (current) drug therapy: Secondary | ICD-10-CM | POA: Diagnosis not present

## 2024-05-20 DIAGNOSIS — F4312 Post-traumatic stress disorder, chronic: Secondary | ICD-10-CM | POA: Diagnosis not present

## 2024-05-20 DIAGNOSIS — F3162 Bipolar disorder, current episode mixed, moderate: Secondary | ICD-10-CM | POA: Diagnosis not present

## 2024-05-20 DIAGNOSIS — F411 Generalized anxiety disorder: Secondary | ICD-10-CM | POA: Diagnosis not present

## 2024-05-25 DIAGNOSIS — N760 Acute vaginitis: Secondary | ICD-10-CM | POA: Diagnosis not present

## 2024-06-02 DIAGNOSIS — N760 Acute vaginitis: Secondary | ICD-10-CM | POA: Diagnosis not present

## 2024-06-02 DIAGNOSIS — B9689 Other specified bacterial agents as the cause of diseases classified elsewhere: Secondary | ICD-10-CM | POA: Diagnosis not present

## 2024-06-20 ENCOUNTER — Ambulatory Visit (INDEPENDENT_AMBULATORY_CARE_PROVIDER_SITE_OTHER)

## 2024-06-20 ENCOUNTER — Ambulatory Visit
Admission: RE | Admit: 2024-06-20 | Discharge: 2024-06-20 | Disposition: A | Discharge status: Home / Self Care | Source: Ambulatory Visit | Attending: Family Medicine | Admitting: Family Medicine

## 2024-06-20 DIAGNOSIS — Z1231 Encounter for screening mammogram for malignant neoplasm of breast: Secondary | ICD-10-CM

## 2024-06-20 DIAGNOSIS — Z23 Encounter for immunization: Secondary | ICD-10-CM

## 2024-06-23 ENCOUNTER — Telehealth: Payer: Self-pay

## 2024-06-23 NOTE — Telephone Encounter (Signed)
 Copied from CRM [id number]. Topic: Clinical - Lab/Test Results >> Jun 23, 2024 12:19 PM Larissa S wrote: Reason for CRM: Patient requesting a callback to discuss mammogram results.

## 2024-06-23 NOTE — Telephone Encounter (Signed)
 Provider has not yet reviewed. Pt will be contacted once provider reviews report.

## 2024-06-24 ENCOUNTER — Telehealth: Payer: Self-pay

## 2024-06-24 ENCOUNTER — Other Ambulatory Visit: Payer: Self-pay | Admitting: Family Medicine

## 2024-06-24 DIAGNOSIS — R928 Other abnormal and inconclusive findings on diagnostic imaging of breast: Secondary | ICD-10-CM

## 2024-06-24 DIAGNOSIS — N63 Unspecified lump in unspecified breast: Secondary | ICD-10-CM

## 2024-06-24 NOTE — Telephone Encounter (Signed)
 Copied from CRM [id number]. Topic: Clinical - Lab/Test Results >> Jun 24, 2024  8:38 AM Cherylann RAMAN wrote: Reason for CRM: Patient requesting for the provider to call regarding her test results from her mammogram. Reached out to CAL and tx'd call successfully >> Jun 24, 2024  8:54 AM Miquel SAILOR wrote: PT was getting transferred to RN but was disconnected. Called and transferred to CAL

## 2024-06-24 NOTE — Telephone Encounter (Signed)
 Pt has already spoken with someone and has her follow up imaging scheduled for 07/02/24 and has no further questions.

## 2024-07-02 ENCOUNTER — Ambulatory Visit
Admission: RE | Admit: 2024-07-02 | Discharge: 2024-07-02 | Disposition: A | Discharge status: Home / Self Care | Source: Ambulatory Visit | Attending: Family Medicine | Admitting: Family Medicine

## 2024-07-02 ENCOUNTER — Encounter: Payer: Self-pay | Admitting: Family Medicine

## 2024-07-02 ENCOUNTER — Other Ambulatory Visit: Payer: Self-pay | Admitting: Family Medicine

## 2024-07-02 DIAGNOSIS — N6322 Unspecified lump in the left breast, upper inner quadrant: Secondary | ICD-10-CM | POA: Diagnosis not present

## 2024-07-02 DIAGNOSIS — N63 Unspecified lump in unspecified breast: Secondary | ICD-10-CM

## 2024-07-02 DIAGNOSIS — R928 Other abnormal and inconclusive findings on diagnostic imaging of breast: Secondary | ICD-10-CM

## 2024-07-08 ENCOUNTER — Ambulatory Visit
Admission: RE | Admit: 2024-07-08 | Discharge: 2024-07-08 | Disposition: A | Discharge status: Home / Self Care | Source: Ambulatory Visit | Attending: Family Medicine | Admitting: Family Medicine

## 2024-07-08 DIAGNOSIS — R928 Other abnormal and inconclusive findings on diagnostic imaging of breast: Secondary | ICD-10-CM

## 2024-07-08 DIAGNOSIS — N6002 Solitary cyst of left breast: Secondary | ICD-10-CM | POA: Diagnosis not present

## 2024-07-08 DIAGNOSIS — N63 Unspecified lump in unspecified breast: Secondary | ICD-10-CM

## 2024-07-15 ENCOUNTER — Ambulatory Visit: Payer: Self-pay | Admitting: Family Medicine

## 2024-07-15 ENCOUNTER — Encounter: Payer: Self-pay | Admitting: Family Medicine

## 2024-07-15 VITALS — BP 106/71 | HR 74 | Temp 98.2°F | Ht 63.0 in | Wt 207.4 lb

## 2024-07-15 DIAGNOSIS — F339 Major depressive disorder, recurrent, unspecified: Secondary | ICD-10-CM

## 2024-07-15 DIAGNOSIS — J4540 Moderate persistent asthma, uncomplicated: Secondary | ICD-10-CM | POA: Diagnosis not present

## 2024-07-15 DIAGNOSIS — I10 Essential (primary) hypertension: Secondary | ICD-10-CM

## 2024-07-15 DIAGNOSIS — R8290 Unspecified abnormal findings in urine: Secondary | ICD-10-CM | POA: Diagnosis not present

## 2024-07-15 DIAGNOSIS — E559 Vitamin D deficiency, unspecified: Secondary | ICD-10-CM | POA: Diagnosis not present

## 2024-07-15 DIAGNOSIS — F411 Generalized anxiety disorder: Secondary | ICD-10-CM

## 2024-07-15 DIAGNOSIS — N760 Acute vaginitis: Secondary | ICD-10-CM

## 2024-07-15 DIAGNOSIS — R7303 Prediabetes: Secondary | ICD-10-CM

## 2024-07-15 DIAGNOSIS — B9689 Other specified bacterial agents as the cause of diseases classified elsewhere: Secondary | ICD-10-CM

## 2024-07-15 DIAGNOSIS — E782 Mixed hyperlipidemia: Secondary | ICD-10-CM

## 2024-07-15 LAB — BAYER DCA HB A1C WAIVED: HB A1C (BAYER DCA - WAIVED): 5.6 % (ref 4.8–5.6)

## 2024-07-15 LAB — URINALYSIS, ROUTINE W REFLEX MICROSCOPIC
Bilirubin, UA: NEGATIVE
Glucose, UA: NEGATIVE
Leukocytes,UA: NEGATIVE
Nitrite, UA: NEGATIVE
Protein,UA: NEGATIVE
RBC, UA: NEGATIVE
Specific Gravity, UA: 1.025 (ref 1.005–1.030)
Urobilinogen, Ur: 0.2 mg/dL (ref 0.2–1.0)
pH, UA: 6 (ref 5.0–7.5)

## 2024-07-15 LAB — WET PREP FOR TRICH, YEAST, CLUE
Trichomonas Exam: NEGATIVE
Yeast Exam: NEGATIVE

## 2024-07-15 MED ORDER — FLUCONAZOLE 150 MG PO TABS: ORAL_TABLET | ORAL | 0 refills | Status: DC

## 2024-07-15 MED ORDER — CLINDAMYCIN HCL 300 MG PO CAPS: 300.0000 mg | ORAL_CAPSULE | Freq: Two times a day (BID) | ORAL | 0 refills | Status: AC

## 2024-07-15 NOTE — Progress Notes (Signed)
 Established Patient Office Visit  Subjective   Patient ID: Alice Santiago, female    DOB: 1980/10/26  Age: 43 y.o. MRN: [id number]  Chief Complaint  Patient presents with   Medical Management of Chronic Issues    HPI  History of Present Illness   Alice Santiago is a 43 year old female who presents with urinary odor.  Malodorous urine - Persistent strong odor during urination since previous episodes of bacterial vaginosis over the last year - Multiple treatments for bacterial vaginosis were ineffective until clindamycin, which resolved the infection but not the urinary odor - No dysuria, vaginal discharge, hematuria, urinary frequency, or urgency  Prediabetes - Prediabetes improved with dietary changes - Diet is less strict than previously; occasionally consumes red meat and avoids pork - No recent blood tests to monitor glycemic status  Asthma - Asthma present - Does not use Symbicort  daily - Intermittent shortness of breath - Established with asthma and allergy, has upcoming appt  Substance use - History of tobacco use since age 68, quit on January 27th, 2025 after 31 years - Regular marijuana use  Blood pressure monitoring - Monitors blood pressure daily  Medication and supplement use - Takes Xanax , prescribed by psychiatry - Uses over-the-counter vitamin D3 and krill oil - Did not take supplements while on antibiotics for bacterial vaginosis         01/14/2024    9:21 AM 10/16/2023    9:41 AM 01/30/2023   11:45 AM  Depression screen PHQ 2/9  Decreased Interest 1 1 2   Down, Depressed, Hopeless 1  2  PHQ - 2 Score 2 1 4   Altered sleeping 2 2 2   Tired, decreased energy 1 1 2   Change in appetite 1 1 2   Feeling bad or failure about yourself  0 0 0  Trouble concentrating 1  2  Moving slowly or fidgety/restless 0 0 0  Suicidal thoughts 0 0 0  PHQ-9 Score 7  5  12       Data saved with a previous flowsheet row definition      01/14/2024    9:22 AM 10/16/2023     9:41 AM 01/30/2023   11:45 AM 12/31/2022    3:28 PM  GAD 7 : Generalized Anxiety Score  Nervous, Anxious, on Edge 2 1 0 0  Control/stop worrying 2  0 0  Worry too much - different things 2  2 1   Trouble relaxing 1 1 0 0  Restless 0 0  0  Easily annoyed or irritable 1 1 1  0  Afraid - awful might happen 0 0 0 0  Total GAD 7 Score 8   1       ROS As per HPI.   Objective:     BP 106/71   Pulse 74   Temp 98.2 F (36.8 C) (Temporal)   Ht 5' 3 (1.6 m)   Wt 207 lb 6.4 oz (94.1 kg)   SpO2 97%   BMI 36.74 kg/m  BP Readings from Last 3 Encounters:  07/15/24 106/71  01/14/24 107/72  01/13/24 130/78   Wt Readings from Last 3 Encounters:  07/15/24 207 lb 6.4 oz (94.1 kg)  01/14/24 199 lb (90.3 kg)  01/13/24 199 lb 12.8 oz (90.6 kg)     Physical Exam Vitals and nursing note reviewed.  Constitutional:      General: She is not in acute distress.    Appearance: Normal appearance. She is not ill-appearing, toxic-appearing or diaphoretic.  Cardiovascular:  Rate and Rhythm: Normal rate and regular rhythm.     Pulses: Normal pulses.     Heart sounds: Normal heart sounds. No murmur heard. Pulmonary:     Effort: Pulmonary effort is normal. No respiratory distress.     Breath sounds: Normal breath sounds.  Abdominal:     General: Bowel sounds are normal. There is no distension.     Palpations: Abdomen is soft. There is no mass.     Tenderness: There is no abdominal tenderness. There is no guarding or rebound.  Musculoskeletal:     Right lower leg: No edema.     Left lower leg: No edema.  Skin:    General: Skin is warm and dry.  Neurological:     General: No focal deficit present.     Mental Status: She is alert and oriented to person, place, and time.  Psychiatric:        Mood and Affect: Mood normal.        Behavior: Behavior normal.      No results found for any visits on 07/15/24.    The 10-year ASCVD risk score (Arnett DK, et al., 2019) is: 2.3%     Assessment & Plan:   Alice Santiago was seen today for medical management of chronic issues.  Diagnoses and all orders for this visit:  Prediabetes -     Bayer DCA Hb A1c Waived  Primary hypertension  Vitamin D  deficiency  Mixed hyperlipidemia  Moderate persistent asthma without complication  Depression, recurrent  GAD (generalized anxiety disorder)  Morbid obesity (HCC)  Foul smelling urine -     Urinalysis, Routine w reflex microscopic -     Urine Culture  Acute vaginitis -     WET PREP FOR TRICH, YEAST, CLUE  Bacterial vaginosis -     Ambulatory referral to Gynecology -     clindamycin (CLEOCIN) 300 MG capsule; Take 1 capsule (300 mg total) by mouth 2 (two) times daily for 7 days.    Assessment and Plan    Recurrent vaginal odor, evaluation for recurrent vaginitis Recurrent vaginal odor with previous bacterial vaginosis treated with flagyl , then clindamycin. No current pain, discharge, or urinary symptoms. Discussed potential referral to GYN for further evaluation and management.  - UA negative - Wet prep with clue cells. Clindamycin ordered for BV treatment.  - Referred to GYN for further evaluation and management of recurrent BV. - Consider probiotics or boric acid suppositories as adjunctive treatments.  Prediabetes Previous improvement in blood glucose levels with dietary changes. Discussed importance of maintaining a healthy diet and lifestyle. - Ordered A1c test today. - Continue dietary modifications and lifestyle changes.  Primary hypertension Blood pressure well-controlled without medication.  - Continue home blood pressure monitoring.  Mixed hyperlipidemia Cholesterol levels previously checked and found to be good. No recent changes in medication or significant lifestyle changes necessitating more frequent monitoring. - Will check cholesterol levels in six months during next physical exam.  Asthma Intermittent shortness of breath. Uses Symbicort  as  needed, not daily. - Continue Symbicort  as needed.     Depression and anxiety Continue follow up with BH.   Morbid obesity Weight trending up. Diet, exercise, weight loss.   Return in about 6 months (around 01/12/2025) for CPE.   The patient indicates understanding of these issues and agrees with the plan.  Alice CHRISTELLA Search, FNP

## 2024-07-15 NOTE — Addendum Note (Signed)
 Addended by: RANDINE ARNULFO MATSU on: 07/15/2024 01:53 PM   Modules accepted: Orders

## 2024-07-18 LAB — URINE CULTURE

## 2024-07-22 DIAGNOSIS — N898 Other specified noninflammatory disorders of vagina: Secondary | ICD-10-CM | POA: Diagnosis not present

## 2024-07-27 ENCOUNTER — Ambulatory Visit (INDEPENDENT_AMBULATORY_CARE_PROVIDER_SITE_OTHER): Admitting: Allergy & Immunology

## 2024-07-27 ENCOUNTER — Encounter: Payer: Self-pay | Admitting: Allergy & Immunology

## 2024-07-27 VITALS — BP 126/70 | HR 90 | Wt 207.0 lb

## 2024-07-27 DIAGNOSIS — J4540 Moderate persistent asthma, uncomplicated: Secondary | ICD-10-CM | POA: Diagnosis not present

## 2024-07-27 DIAGNOSIS — J302 Other seasonal allergic rhinitis: Secondary | ICD-10-CM | POA: Diagnosis not present

## 2024-07-27 DIAGNOSIS — J3089 Other allergic rhinitis: Secondary | ICD-10-CM

## 2024-07-27 MED ORDER — BUDESONIDE-FORMOTEROL FUMARATE 160-4.5 MCG/ACT IN AERO: 2.0000 | INHALATION_SPRAY | Freq: Two times a day (BID) | RESPIRATORY_TRACT | 5 refills | Status: AC

## 2024-07-27 MED ORDER — ALBUTEROL SULFATE (2.5 MG/3ML) 0.083% IN NEBU: 2.5000 mg | INHALATION_SOLUTION | RESPIRATORY_TRACT | 0 refills | Status: AC | PRN

## 2024-07-27 MED ORDER — LEVALBUTEROL TARTRATE 45 MCG/ACT IN AERO: 2.0000 | INHALATION_SPRAY | RESPIRATORY_TRACT | 1 refills | Status: AC | PRN

## 2024-07-27 NOTE — Progress Notes (Signed)
 FOLLOW UP  Date of Service/Encounter:  07/27/24   Assessment:   Moderate persistent asthma, uncomplicated    Seasonal and perennial allergic rhinitis (grasses, ragweed, weeds, indoor molds, outdoor molds, dust mites and cat and mixed feathers)  Plan/Recommendations:   Asthma - Lung testing looks decent today. - I am glad that you are doing  - Daily controller medication(s): Symbicort  160/4.74mcg two puffs once daily - Prior to physical activity: albuterol  2 puffs 10-15 minutes before physical activity. - Rescue medications: albuterol  4 puffs every 4-6 hours as needed and albuterol  nebulizer one vial every 4-6 hours as needed - Changes during respiratory infections or worsening symptoms: Increase Symbicort  to 2 puffs twice daily for TWO WEEKS. - Asthma control goals:  * Full participation in all desired activities (may need albuterol  before activity) * Albuterol  use two time or less a week on average (not counting use with activity) * Cough interfering with sleep two time or less a month * Oral steroids no more than once a year * No hospitalizations  2. Allergic rhinitis (grasses, ragweed, weeds, indoor molds, outdoor molds, dust mites and cat and mixed feathers) - We are holding off on an antihistamine since    - Consider saline nasal rinses as needed for nasal symptoms.  - Use this before any nasal sprays for best result  3. Return in about 1 year (around 07/27/2025). You can have the follow up appointment with Dr. Iva or a Nurse Practicioner (our Nurse Practitioners are excellent and always have Physician oversight!).    Subjective:   Alice Santiago is a 43 y.o. 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   Substance abuse (HCC) 10/16/2023   Depression, recurrent 10/07/2023   Primary hypertension 10/07/2023   Nicotine dependence 04/08/2023   Cardiac arrhythmia 01/30/2023   Chronic nonintractable headache 01/30/2023   Mixed hyperlipidemia 04/03/2021   Chronic bilateral low back pain without sciatica 08/13/2020   Difficulty sleeping 02/09/2020   Overactive bladder 11/22/2019   Stress incontinence, female 05/18/2019   GAD (generalized anxiety disorder) 05/20/2018   History of ovarian cyst 03/14/2018   Cervical vertebral fusion 10/08/2017   Seasonal and perennial allergic rhinitis 12/02/2016   Moderate persistent asthma 12/02/2016   S/P lumbar laminectomy 11/10/2014   Degenerative disc disease, lumbar 11/22/2013   Mood disorder 07/26/2013    History obtained from: chart review and patient.  Discussed the use of AI scribe software for clinical note transcription with the patient and/or guardian, who gave verbal consent to proceed.  Alice Santiago is a 43 y.o. female presenting for a follow up visit.  She was last seen in May 2025.  At that time, Lyme testing was decent.  We continue with Symbicort  2 puffs once daily as well as albuterol  as needed.  We stopped her cetirizine  and started Xyzal  instead.  We continue with Astelin  2 sprays per nostril 1-2 times daily.  Since last visit, she has done well.   Asthma/Respiratory Symptom History: She has not used albuterol  in the past few months. She uses her nebulizer treatments  and Symbicort  as needed. She needs refills for her nebulizer and Symbicort  as she has run out, and the pharmacy has contacted the office for refills multiple times. She estimates using her Symbicort  inhaler every six months and has not used her Xopenex  rescue inhaler recently.  She has not smoked cigarettes since January 27 but  continues to use marijuana for medical needs such as sleep and nausea. She has informed her heart doctor and other doctors about her marijuana use.  Allergic Rhinitis Symptom History: She does not take any allergy medications currently. She previously used Zyrtec  but stopped due to constipation. She is unaware of being switched to levocetirizine and reports no current allergy symptoms such as sneezing. No sneezing or need for allergy medications.   Otherwise, there have been no changes to her past medical history, surgical history, family history, or social history.    Review of systems otherwise negative other than that mentioned in the HPI.    Objective:   Blood pressure 126/70, pulse 90, weight 207 lb (93.9 kg), SpO2 96%. Body mass index is 36.67 kg/m.    Physical Exam Vitals reviewed.  Constitutional:      Appearance: She is well-developed. She is obese.  HENT:     Head: Normocephalic and atraumatic.     Right Ear: Tympanic membrane, ear canal and external ear normal. No drainage, swelling or tenderness. Tympanic membrane is not injected, scarred, erythematous, retracted or bulging.     Left Ear: Tympanic membrane, ear canal and external ear normal. No drainage, swelling or tenderness. Tympanic membrane is not injected, scarred, erythematous, retracted or bulging.     Nose: No nasal deformity, septal deviation, mucosal edema or rhinorrhea.     Right Turbinates: Enlarged, swollen and pale.     Left Turbinates: Enlarged, swollen and pale.     Right Sinus: No maxillary sinus tenderness or frontal sinus tenderness.     Left Sinus: No maxillary sinus tenderness or frontal sinus tenderness.     Comments: No polyps.     Mouth/Throat:     Lips: Pink.     Mouth: Mucous membranes are moist. Mucous membranes are not pale and not dry.     Pharynx: Uvula midline.  Eyes:     General: Lids are normal. Allergic shiner present.        Right eye: No discharge.        Left eye: No  discharge.     Conjunctiva/sclera: Conjunctivae normal.     Right eye: Right conjunctiva is not injected. No chemosis.    Left eye: Left conjunctiva is not injected. No chemosis.    Pupils: Pupils are equal, round, and reactive to light.  Cardiovascular:     Rate and Rhythm: Normal rate and regular rhythm.     Heart sounds: Normal heart sounds.  Pulmonary:     Effort: Pulmonary effort is normal. No tachypnea, accessory muscle usage or respiratory distress.     Breath sounds: Normal breath sounds. No wheezing, rhonchi or rales.     Comments: Minimal air movement at the bases. Coarse airway sounds throughout.  Chest:     Chest wall: No tenderness.  Abdominal:     Tenderness: There is no abdominal tenderness. There is no guarding or rebound.  Lymphadenopathy:     Head:     Right side of head: No submandibular, tonsillar or occipital adenopathy.     Left side of head: No submandibular, tonsillar or occipital adenopathy.     Cervical: No  cervical adenopathy.  Skin:    General: Skin is warm.     Capillary Refill: Capillary refill takes less than 2 seconds.     Coloration: Skin is not pale.     Findings: No abrasion, erythema, petechiae or rash. Rash is not papular, urticarial or vesicular.     Comments: No eczematous or urticarial lesions noted.   Neurological:     Mental Status: She is alert.  Psychiatric:        Behavior: Behavior is cooperative.      Diagnostic studies:    Spirometry:   FEV1 2.22/79%, FVC 4.05/117%, FEV1 to FVC ratio 55%.   Spirometry consistent with normal pattern.   Allergy Studies:  none      Marty Shaggy, MD  [facility] 

## 2024-07-27 NOTE — Patient Instructions (Addendum)
 Asthma - Lung testing looks decent today. - I am glad that you are doing  - Daily controller medication(s): Symbicort  160/4.81mcg two puffs once daily - Prior to physical activity: albuterol  2 puffs 10-15 minutes before physical activity. - Rescue medications: albuterol  4 puffs every 4-6 hours as needed and albuterol  nebulizer one vial every 4-6 hours as needed - Changes during respiratory infections or worsening symptoms: Increase Symbicort  to 2 puffs twice daily for TWO WEEKS. - Asthma control goals:  * Full participation in all desired activities (may need albuterol  before activity) * Albuterol  use two time or less a week on average (not counting use with activity) * Cough interfering with sleep two time or less a month * Oral steroids no more than once a year * No hospitalizations  2. Allergic rhinitis (grasses, ragweed, weeds, indoor molds, outdoor molds, dust mites and cat and mixed feathers) - We are holding off on an antihistamine since    - Consider saline nasal rinses as needed for nasal symptoms.  - Use this before any nasal sprays for best result  3. Return in about 1 year (around 07/27/2025). You can have the follow up appointment with Dr. Iva or a Nurse Practicioner (our Nurse Practitioners are excellent and always have Physician oversight!).    Please inform us  of any Emergency Department visits, hospitalizations, or changes in symptoms. Call us  before going to the ED for breathing or allergy symptoms since we might be able to fit you in for a sick visit. Feel free to contact us  anytime with any questions, problems, or concerns.  It was a pleasure to see you again today!  Websites that have reliable patient information: 1. American Academy of Asthma, Allergy, and Immunology: www.aaaai.org 2. Food Allergy Research and Education (FARE): foodallergy.org 3. Mothers of Asthmatics: http://www.asthmacommunitynetwork.org 4. American College of Allergy, Asthma, and  Immunology: www.acaai.org      "Like" us  on Facebook and Instagram for our latest updates!      A healthy democracy works best when [employer] participate! Make sure you are registered to vote! If you have moved or changed any of your contact information, you will need to get this updated before voting! Scan the QR codes below to learn more!

## 2024-07-29 ENCOUNTER — Encounter: Payer: Self-pay | Admitting: Allergy & Immunology

## 2024-07-29 ENCOUNTER — Telehealth: Payer: Self-pay | Admitting: Allergy & Immunology

## 2024-07-29 NOTE — Telephone Encounter (Signed)
 Pt states pharmacy requesting a PA for symbicort .

## 2024-08-01 ENCOUNTER — Other Ambulatory Visit (HOSPITAL_COMMUNITY): Payer: Self-pay

## 2024-08-09 ENCOUNTER — Encounter: Payer: Self-pay | Admitting: *Deleted

## 2024-08-18 DIAGNOSIS — N761 Subacute and chronic vaginitis: Secondary | ICD-10-CM | POA: Diagnosis not present

## 2024-08-19 DIAGNOSIS — F4312 Post-traumatic stress disorder, chronic: Secondary | ICD-10-CM | POA: Diagnosis not present

## 2024-08-19 DIAGNOSIS — F411 Generalized anxiety disorder: Secondary | ICD-10-CM | POA: Diagnosis not present

## 2024-08-19 DIAGNOSIS — F3162 Bipolar disorder, current episode mixed, moderate: Secondary | ICD-10-CM | POA: Diagnosis not present

## 2024-08-25 ENCOUNTER — Telehealth: Payer: Self-pay | Admitting: Cardiology

## 2024-08-25 NOTE — Telephone Encounter (Signed)
 PT would like to know if a mushroom coffee supplement will be safe for her heart. Please advise.

## 2024-08-25 NOTE — Telephone Encounter (Signed)
Mychart message sent with response

## 2024-08-25 NOTE — Telephone Encounter (Signed)
 Left message for patient to call back

## 2024-10-05 ENCOUNTER — Other Ambulatory Visit: Payer: Self-pay | Admitting: Neurological Surgery

## 2024-10-05 DIAGNOSIS — G8929 Other chronic pain: Secondary | ICD-10-CM

## 2024-10-05 DIAGNOSIS — M542 Cervicalgia: Secondary | ICD-10-CM

## 2024-10-11 ENCOUNTER — Ambulatory Visit: Payer: Self-pay

## 2024-10-11 NOTE — Telephone Encounter (Signed)
 FYI Only or Action Required?: Action required by provider: clinical question for provider and update on patient condition.  Patient was last seen in primary care on 07/15/2024 by Joesph Annabella HERO, FNP.  Called Nurse Triage reporting Breast Pain.  Symptoms began several months ago.  Interventions attempted: Nothing.  Symptoms are: unchanged.  Triage Disposition: See PCP Within 2 Weeks  Patient/caregiver understands and will follow disposition?: No, wishes to speak with PCP     Message from Miquel SAILOR sent at 10/11/2024  3:01 PM EST  Reason for Triage: PT had LT breast still has disconfort after procedure from mammogram pain/Breast center put needle in it after she has been feeling worse after this       Reason for Disposition  [1] Breast pain AND [2] cause is not known  Answer Assessment - Initial Assessment Questions Pt called to report L breast pain post needle aspiration 07/08/25. Pt states she suspected discomfort was related to procedure but is still present and worried that something else is going on. Discussed appt with PCP but pt is requesting orders to breast center for repeat U/S. Pt states she has no true pain and is not taking any medications for symptom relief. Offered several appts with PCP but pt declined. Pt wanting U/S orders so she can schedule with breast center on 11/09/24. Pt states she is already going to be at [place] that day for an MRI. Pt does not currently have transport and stated any appt would need to be at least 2 weeks out. Pt is available via my chart or cell.    1. SYMPTOM: What's the main symptom you're concerned about?  (e.g., lump, nipple discharge, pain, rash)     L breast discomfort   2. LOCATION: Where is the symptoms located?     L breast; pt reports needle aspiration 10/31 and discomfort is all in area where needle aspiration occurred   3. ONSET: When did symptoms  start?     Ongoing since procedure, 07/08/25  4. PRIOR  HISTORY: Do you have any history of prior problems with your breasts? (e.g., breast cancer, breast implant, fibrocystic breast disease)     No   5. CAUSE: What do you think is causing this symptom?     Needle aspiration   6. OTHER SYMPTOMS: Do you have any other symptoms? (e.g., breast pain, fever, nipple discharge, redness or rash)     None  Protocols used: Breast Symptoms-A-AH

## 2024-10-12 NOTE — Telephone Encounter (Signed)
 Will NTBS. Breast center recommended repeat mammogram in 1 year after biopsy. I'm not able to order imaging without an evaluation. Will need to be in person.

## 2024-10-12 NOTE — Telephone Encounter (Signed)
 Called and spoke with patient. Made her aware of PCP advise. Patient says she is unable to make an appt right now because her car is broke down so she doesn't have transportation but as soon as she gets her car fixed she will call the office to make an appt.

## 2024-10-14 ENCOUNTER — Ambulatory Visit: Admitting: Family Medicine

## 2024-10-14 ENCOUNTER — Encounter: Payer: Self-pay | Admitting: Family Medicine

## 2024-10-14 VITALS — BP 107/68 | Ht 63.0 in | Wt 201.8 lb

## 2024-10-14 DIAGNOSIS — N6002 Solitary cyst of left breast: Secondary | ICD-10-CM

## 2024-10-14 DIAGNOSIS — L304 Erythema intertrigo: Secondary | ICD-10-CM

## 2024-10-14 DIAGNOSIS — I10 Essential (primary) hypertension: Secondary | ICD-10-CM

## 2024-10-14 DIAGNOSIS — G4733 Obstructive sleep apnea (adult) (pediatric): Secondary | ICD-10-CM

## 2024-10-14 DIAGNOSIS — N644 Mastodynia: Secondary | ICD-10-CM

## 2024-10-14 MED ORDER — ZINC OXIDE 20 % EX OINT: 1.0000 | TOPICAL_OINTMENT | CUTANEOUS | 0 refills | Status: AC | PRN

## 2024-10-14 NOTE — Progress Notes (Signed)
 "  Acute Office Visit  Subjective:     Patient ID: Alice Santiago, female    DOB: [birth year], 44 y.o.   MRN: [id number]  Chief Complaint  Patient presents with   Breast Pain    HPI  History of Present Illness   Alice Santiago is a 44 year old female who presents with persistent left breast pain following a prior cyst aspiration.  Left breast pain - Persistent pain localized to the area of prior cyst aspiration in the left breast - Pain is intermittent, lasting for up to hours at a time - Occasional radiation to the lateral aspect of the breast - No skin changes, nipple changes, or nipple discharge  Cutaneous irritation of the breast - Redness and irritation under the breasts associated with sweating - Irritation is not pruritic - Manages irritation by using deodorant and placing a dry washcloth under the breasts at night  Blood pressure monitoring - Monitors blood pressure at home for the past year - Discrepancies between home readings and medical office readings - Issues with blood pressure cuff size and battery type may affect accuracy - Blood pressure generally well-controlled except during emotional distress  Sleep apnea - History of sleep apnea - Difficulty tolerating CPAP therapy due to discomfort with air pressure - Has not returned to sleep specialist after unsuccessful CPAP trial - Interested in alternative treatments for sleep apnea - Sleep study was done years ago  Substance use and lifestyle modification - Quit smoking nicotine one year ago - Stopped caffeine intake two years ago - Recently reduced marijuana use  Gynecologic history - History of hysterectomy - No menstrual cycles       ROS As per HPI.      Objective:    BP 107/68   Ht 5' 3 (1.6 m)   Wt 201 lb 12.8 oz (91.5 kg)   BMI 35.75 kg/m  BP Readings from Last 3 Encounters:  07/27/24 126/70  07/15/24 106/71  01/14/24 107/72      Physical Exam Vitals and nursing note reviewed.   Constitutional:      General: She is not in acute distress.    Appearance: She is not ill-appearing, toxic-appearing or diaphoretic.  Cardiovascular:     Rate and Rhythm: Normal rate and regular rhythm.     Heart sounds: Normal heart sounds. No murmur heard. Pulmonary:     Effort: Pulmonary effort is normal. No respiratory distress.  Chest:  Breasts:    Breasts are symmetrical.     Right: Normal.     Left: Normal.     Comments: Erythema under bilateral breasts. No skin breakdown or rash present.  Musculoskeletal:     Right lower leg: No edema.     Left lower leg: No edema.  Lymphadenopathy:     Upper Body:     Right upper body: No supraclavicular, axillary or pectoral adenopathy.     Left upper body: No supraclavicular, axillary or pectoral adenopathy.  Skin:    General: Skin is warm and dry.  Neurological:     General: No focal deficit present.     Mental Status: She is alert and oriented to person, place, and time.  Psychiatric:        Mood and Affect: Mood normal.        Behavior: Behavior normal.     No results found for any visits on 10/14/24.      Assessment & Plan:   Alice Santiago was seen today for breast  pain.  Diagnoses and all orders for this visit:  Breast pain, left -     MM 3D DIAGNOSTIC MAMMOGRAM BILATERAL BREAST; Future  Benign cyst of left breast -     MM 3D DIAGNOSTIC MAMMOGRAM BILATERAL BREAST; Future  Intertrigo -     zinc  oxide 20 % ointment; Apply 1 Application topically as needed for irritation.  Obstructive sleep apnea -     Ambulatory referral to Sleep Studies  Primary hypertension   Assessment and Plan    Left breast pain with history of benign cyst Intermittent pain persisting for months following aspiration. Will order mammo but discussed possibly due to nerve irritation or scar tissue from aspiration. No palpable mass on exam  - Ordered diagnostic mammogram for further evaluation.  Intertrigo under breasts Redness and irritation  likely due to moisture and friction. No yeast infection present. - Recommended over-the-counter zinc  cream as a skin barrier.  Obstructive sleep apnea Difficulty tolerating CPAP therapy. Interested in CPAP alternatives. - Referred to [facility] for evaluation and management  Primary hypertension BP is well controlled.      Return to office for new or worsening symptoms, or if symptoms persist.   The patient indicates understanding of these issues and agrees with the plan.  Alice CHRISTELLA Search, FNP   "

## 2024-10-18 ENCOUNTER — Ambulatory Visit: Admitting: Cardiology

## 2024-11-02 ENCOUNTER — Ambulatory Visit: Admitting: Cardiology

## 2024-11-09 ENCOUNTER — Other Ambulatory Visit

## 2025-01-23 ENCOUNTER — Encounter: Admitting: Family Medicine

## 2025-02-02 ENCOUNTER — Encounter: Admitting: Family Medicine

## 2025-07-28 ENCOUNTER — Ambulatory Visit: Admitting: Allergy & Immunology
